# Patient Record
Sex: Male | Born: 1940 | Race: White | Hispanic: No | Marital: Married | Smoking: Current every day smoker
Health system: Southern US, Community
[De-identification: ages and names within clinical notes are randomized; demographics above are authoritative.]

## PROBLEM LIST (undated history)

## (undated) DIAGNOSIS — I350 Nonrheumatic aortic (valve) stenosis: Secondary | ICD-10-CM

## (undated) DIAGNOSIS — R011 Cardiac murmur, unspecified: Secondary | ICD-10-CM

## (undated) DIAGNOSIS — F329 Major depressive disorder, single episode, unspecified: Secondary | ICD-10-CM

## (undated) DIAGNOSIS — N400 Enlarged prostate without lower urinary tract symptoms: Secondary | ICD-10-CM

## (undated) DIAGNOSIS — F32A Depression, unspecified: Secondary | ICD-10-CM

## (undated) DIAGNOSIS — J45909 Unspecified asthma, uncomplicated: Secondary | ICD-10-CM

## (undated) DIAGNOSIS — A0472 Enterocolitis due to Clostridium difficile, not specified as recurrent: Secondary | ICD-10-CM

## (undated) DIAGNOSIS — IMO0001 Reserved for inherently not codable concepts without codable children: Secondary | ICD-10-CM

## (undated) DIAGNOSIS — K219 Gastro-esophageal reflux disease without esophagitis: Secondary | ICD-10-CM

## (undated) HISTORY — DX: Major depressive disorder, single episode, unspecified: F32.9

## (undated) HISTORY — DX: Cardiac murmur, unspecified: R01.1

## (undated) HISTORY — DX: Benign prostatic hyperplasia without lower urinary tract symptoms: N40.0

## (undated) HISTORY — DX: Nonrheumatic aortic (valve) stenosis: I35.0

## (undated) HISTORY — DX: Unspecified asthma, uncomplicated: J45.909

## (undated) HISTORY — DX: Depression, unspecified: F32.A

---

## 2005-08-13 ENCOUNTER — Ambulatory Visit: Payer: Self-pay | Admitting: Gastroenterology

## 2006-10-22 ENCOUNTER — Ambulatory Visit: Payer: Self-pay | Admitting: Gastroenterology

## 2013-06-07 DIAGNOSIS — N529 Male erectile dysfunction, unspecified: Secondary | ICD-10-CM | POA: Insufficient documentation

## 2013-06-07 DIAGNOSIS — N400 Enlarged prostate without lower urinary tract symptoms: Secondary | ICD-10-CM | POA: Insufficient documentation

## 2013-06-07 DIAGNOSIS — I359 Nonrheumatic aortic valve disorder, unspecified: Secondary | ICD-10-CM | POA: Insufficient documentation

## 2013-12-11 DIAGNOSIS — R21 Rash and other nonspecific skin eruption: Secondary | ICD-10-CM | POA: Insufficient documentation

## 2014-08-15 DIAGNOSIS — Z720 Tobacco use: Secondary | ICD-10-CM | POA: Insufficient documentation

## 2014-10-11 ENCOUNTER — Ambulatory Visit (INDEPENDENT_AMBULATORY_CARE_PROVIDER_SITE_OTHER): Payer: Commercial Managed Care - HMO | Admitting: Cardiovascular Disease

## 2014-10-11 ENCOUNTER — Encounter: Payer: Self-pay | Admitting: Cardiovascular Disease

## 2014-10-11 VITALS — BP 112/80 | HR 63 | Ht 73.5 in | Wt 165.0 lb

## 2014-10-11 DIAGNOSIS — I350 Nonrheumatic aortic (valve) stenosis: Secondary | ICD-10-CM | POA: Insufficient documentation

## 2014-10-11 NOTE — Progress Notes (Signed)
Cardiology Office Note Date:  10/11/2014   ID:  Daniel Sims Sr., DOB 11/16/1940, MRN [id number]  PCP:  Tracie Harrier, MD  Cardiologist:  Jordan Hawks, MD  Chief Complaint  Patient presents with  . Shortness of Breath   History of Present Illness: Daniel KIMMEY Sr. is a 74 y.o. male who presents for  Evaluation of aortic stenosis. The patient has been followed for a heart murmur for many years. He remembers being told of a murmur when he was in his 37s. He was always asymptomatic until the past 6 months. An echocardiogram performed August 30 demonstrated normal left ventricular systolic function with thickening and calcification of the aortic valve. The peak transvalvular velocity was 399 centimeters per second. The mean transaortic valve gradient was 37 mmHg and the peak gradient was 64 mmHg. There was trivial mitral regurgitation, mild tricuspid regurgitation , and no other significant abnormalities noted. The patient was referred to Dr. Ubaldo Glassing who subsequently referred him here for consideration of further treatment options for severe symptomatic aortic stenosis.    The patient describes a 6 month history of exertional shortness of breath and "exhaustion." He also has developed discomfort in his chest radiating to the upper back that occurs both after exertion and during physical activity. When he slows down or stops his symptoms abate. He denies lightheadedness or syncope. He's had no resting chest discomfort or shortness of breath.  The patient is here today with his wife. He is retired from the [employer]. They have 3 grown children who live locally. The patient has been a smoker now for his entire adult life. He currently smokes one third pack of cigarettes daily. He does not drink alcohol. He has never been hospitalized, nor has he had any surgeries.   Past Medical History  Diagnosis Date  . BPH (benign prostatic hyperplasia)   . Asthma   . Aortic stenosis     EF 50% echo 2010    . Heart murmur     No past surgical history on file.  Current Outpatient Prescriptions  Medication Sig Dispense Refill  . CVS ZINC 50 MG TABS Take 1 tablet by mouth daily.    . ferrous sulfate 325 (65 FE) MG tablet Take 1 tablet by mouth daily.    Marland Kitchen ibuprofen (ADVIL,MOTRIN) 200 MG tablet Take 800 mg by mouth every 8 (eight) hours as needed. Prn for pain    . levocetirizine (XYZAL) 5 MG tablet Take 5 mg by mouth daily.    . montelukast (SINGULAIR) 10 MG tablet Take 10 mg by mouth at bedtime.    . Multiple Vitamin (MULTI-VITAMINS) TABS Take 1 tablet by mouth daily.    Marland Kitchen omeprazole (PRILOSEC) 20 MG capsule Take 20 mg by mouth daily.    . sildenafil (REVATIO) 20 MG tablet Take 20 mg by mouth 2 (two) times daily. Prn for Erectile dysfunction    . tamsulosin (FLOMAX) 0.4 MG CAPS capsule Take 0.4 mg by mouth daily.    . vitamin C (ASCORBIC ACID) 500 MG tablet Take 500 mg by mouth daily.     No current facility-administered medications for this visit.    Allergies:   Penicillin g   Social History:  The patient  reports that he has been smoking Cigarettes.  He has been smoking about 0.75 packs per day. He does not have any smokeless tobacco history on file.   Family History:  The patient's family history includes Breast cancer in his mother; Diabetes in his  father; Heart disease in his father; Lung cancer in his mother.    ROS:  Please see the history of present illness.  Otherwise, review of systems is positive for  Chest pain, orthopnea, cough , excessive fatigue, snoring, wheezing.  All other systems are reviewed and negative.    PHYSICAL EXAM: VS:  BP 112/80 mmHg  Pulse 63  Ht 6' 1.5" (1.867 m)  Wt 165 lb (74.844 kg)  BMI 21.47 kg/m2 , BMI Body mass index is 21.47 kg/(m^2). GEN: Well nourished, well developed, in no acute distress HEENT: normal Neck: no JVD, no masses.  Normal carotid upstrokes with bilateral bruits Cardiac: RRR with  Grade 3/6 late peaking harsh systolic murmur  best heard at the left lower sternal border, no diastolic murmur               Respiratory:  clear to auscultation bilaterally, normal work of breathing GI: soft, nontender, nondistended, + BS MS: no deformity or atrophy Ext: no pretibial edema Skin: warm and dry, no rash Neuro:  Strength and sensation are intact Psych: euthymic mood, full affect  EKG:  EKG is ordered today. The ekg ordered today shows  Normal sinus rhythm 63 bpm, within normal limits.  Recent Labs: No results found for requested labs within last 365 days.   Lipid Panel  No results found for: CHOL, TRIG, HDL, CHOLHDL, VLDL, LDLCALC, LDLDIRECT    Wt Readings from Last 3 Encounters:  10/11/14 165 lb (74.844 kg)     Cardiac Studies Reviewed:  2-D echocardiogram as outlined above  STS Risk Calculator: Procedure: AV Replacement  Risk of Mortality: 2.052%  Morbidity or Mortality: 15.769%  Long Length of Stay: 6.653%  Short Length of Stay: 40.657%  Permanent Stroke: 0.978%  Prolonged Ventilation: 9.317%  DSW Infection: 0.397%  Renal Failure: 3.173%  Reoperation: 7.618%    ASSESSMENT AND PLAN: This is a 74 year old gentleman with physical exam and echo findings consistent with severe aortic stenosis. He exhibits symptoms of fatigue, exertional dyspnea , and chest discomfort , New York Heart Association functional class III.  I have reviewed the natural history of aortic stenosis with the patient and his wife who is present today. We have discussed the limitations of medical therapy and the poor prognosis associated with symptomatic aortic stenosis. We have also reviewed potential treatment options, including palliative medical therapy, conventional surgical aortic valve replacement, and transcatheter aortic valve replacement. We discussed treatment options in the context of this patient's specific comorbid medical conditions.   I agree the next step in his evaluation should be right and left heart catheterization.  I have reviewed the risks, indications, and alternatives to cardiac catheterization with the patient. Risks include but are not limited to bleeding, infection, vascular injury, stroke, myocardial infection, arrhythmia, kidney injury, radiation-related injury in the case of prolonged fluoroscopy use, emergency cardiac surgery, and death. The patient understands the risks of serious complication is low (<9%).  Pending the results of cardiac catheterization, he will be referred to cardiac surgery for consideration of further treatment options. He does have some degree of chronic lung disease and this will need to be better defined prior to cardiac surgery. I suspect his lung disease is not severe based on his exam and symptoms. His STS risk of mortality for isolated AVR is approximately 2%.  He has a prominent anginal component to his symptoms and considering his long tobacco history he very well may have obstructive CAD. Further plans pending results of cardiac cath.  Current  medicines are reviewed with the patient today.  The patient voices no concerns regarding medicines.  Labs/ tests ordered today include:   Orders Placed This Encounter  Procedures  . CBC  . Basic Metabolic Panel (BMET)  . EKG 12-Lead    Disposition:   FU pending catheterization results  Signed, Sherren Mocha, MD  10/11/2014 1:49 PM    [facility] [place], [place], [place]  [address] Phone: [phone]; Fax: [phone]

## 2014-10-11 NOTE — Patient Instructions (Addendum)
Medication Instructions:  Your physician recommends that you continue on your current medications as directed. Please refer to the Current Medication list given to you today.   Labwork:  CBC BMET  NEEDED ON 10/15/14   Testing/Procedures:  *YOU HAVE BEEN SCHEDULED FOR A LEFT AND RIGHT HEART CATH WITH DR      ON  9 /30 /16  @ 7:30 AM    *PLEASE ARRIVE @ 5:30 AM  [place] [place] ENTRANCE TO BE DIRECT TO        ADMITTING  *PLEASE HAVE NOTHING TO EAST OR DRINK AFTER MIDNIGHT THE NIGHT      BEFORE YOUR PROCEDURE   *YOU MAY TAKE ALL YOUR MORNING MEDS WITH A SMALL AMOUNT OF WATER   * MAKE SURE YOU HAVE A CHANGE OF CLOTHING JUST  IN CASE YOU MAY    Follow-Up:  POST CATH FOLLOW UP WITH DR Burt Knack  AFTER 10/19/14   Any Other Special Instructions Will Be Listed Below (If Applicable).

## 2014-10-15 ENCOUNTER — Other Ambulatory Visit (INDEPENDENT_AMBULATORY_CARE_PROVIDER_SITE_OTHER): Payer: Commercial Managed Care - HMO | Admitting: *Deleted

## 2014-10-15 DIAGNOSIS — I350 Nonrheumatic aortic (valve) stenosis: Secondary | ICD-10-CM

## 2014-10-15 LAB — BASIC METABOLIC PANEL
BUN: 18 mg/dL (ref 6–23)
CALCIUM: 9.3 mg/dL (ref 8.4–10.5)
CHLORIDE: 105 meq/L (ref 96–112)
CO2: 30 meq/L (ref 19–32)
CREATININE: 1.24 mg/dL (ref 0.40–1.50)
GFR: 60.52 mL/min (ref >60.00)
GLUCOSE: 85 mg/dL (ref 70–99)
Potassium: 4.7 mEq/L (ref 3.5–5.1)
Sodium: 140 mEq/L (ref 135–145)

## 2014-10-15 LAB — CBC
HEMATOCRIT: 46.7 % (ref 39.0–52.0)
HEMOGLOBIN: 15.6 g/dL (ref 13.0–17.0)
MCHC: 33.5 g/dL (ref 30.0–36.0)
MCV: 96.3 fl (ref 78.0–100.0)
PLATELETS: 313 10*3/uL (ref 150.0–400.0)
RBC: 4.85 Mil/uL (ref 4.22–5.81)
RDW: 13.2 % (ref 11.5–15.5)
WBC: 10.4 10*3/uL (ref 4.0–10.5)

## 2014-10-19 ENCOUNTER — Encounter (HOSPITAL_COMMUNITY): Payer: Self-pay | Admitting: Cardiovascular Disease

## 2014-10-19 ENCOUNTER — Encounter (HOSPITAL_COMMUNITY)
Admission: RE | Disposition: A | Discharge status: Home / Self Care | Payer: Self-pay | Source: Ambulatory Visit | Attending: Cardiovascular Disease

## 2014-10-19 ENCOUNTER — Ambulatory Visit (HOSPITAL_COMMUNITY)
Admission: RE | Admit: 2014-10-19 | Discharge: 2014-10-19 | Disposition: A | Discharge status: Home / Self Care | Payer: Commercial Managed Care - HMO | Source: Ambulatory Visit | Attending: Cardiovascular Disease | Admitting: Cardiovascular Disease

## 2014-10-19 DIAGNOSIS — F17210 Nicotine dependence, cigarettes, uncomplicated: Secondary | ICD-10-CM | POA: Insufficient documentation

## 2014-10-19 DIAGNOSIS — J45909 Unspecified asthma, uncomplicated: Secondary | ICD-10-CM | POA: Insufficient documentation

## 2014-10-19 DIAGNOSIS — N400 Enlarged prostate without lower urinary tract symptoms: Secondary | ICD-10-CM | POA: Diagnosis not present

## 2014-10-19 DIAGNOSIS — I350 Nonrheumatic aortic (valve) stenosis: Secondary | ICD-10-CM | POA: Diagnosis not present

## 2014-10-19 HISTORY — PX: CARDIAC CATHETERIZATION: SHX172

## 2014-10-19 LAB — POCT I-STAT 3, VENOUS BLOOD GAS (G3P V)
ACID-BASE DEFICIT: 1 mmol/L (ref 0.0–2.0)
BICARBONATE: 25.2 meq/L — AB (ref 20.0–24.0)
BICARBONATE: 25.5 meq/L — AB (ref 20.0–24.0)
Bicarbonate: 26.1 mEq/L — ABNORMAL HIGH (ref 20.0–24.0)
O2 SAT: 67 %
O2 SAT: 68 %
O2 SAT: 71 %
PCO2 VEN: 46.9 mmHg (ref 45.0–50.0)
PO2 VEN: 37 mmHg (ref 30.0–45.0)
PO2 VEN: 38 mmHg (ref 30.0–45.0)
TCO2: 27 mmol/L (ref 0–100)
TCO2: 27 mmol/L (ref 0–100)
TCO2: 28 mmol/L (ref 0–100)
pCO2, Ven: 45.1 mmHg (ref 45.0–50.0)
pCO2, Ven: 48.1 mmHg (ref 45.0–50.0)
pH, Ven: 7.338 — ABNORMAL HIGH (ref 7.250–7.300)
pH, Ven: 7.342 — ABNORMAL HIGH (ref 7.250–7.300)
pH, Ven: 7.361 — ABNORMAL HIGH (ref 7.250–7.300)
pO2, Ven: 39 mmHg (ref 30.0–45.0)

## 2014-10-19 LAB — POCT I-STAT 3, ART BLOOD GAS (G3+)
Bicarbonate: 25.1 mEq/L — ABNORMAL HIGH (ref 20.0–24.0)
O2 Saturation: 93 %
PCO2 ART: 43.3 mmHg (ref 35.0–45.0)
PH ART: 7.371 (ref 7.350–7.450)
TCO2: 26 mmol/L (ref 0–100)
pO2, Arterial: 71 mmHg — ABNORMAL LOW (ref 80.0–100.0)

## 2014-10-19 LAB — PROTIME-INR
INR: 1.02 (ref 0.00–1.49)
PROTHROMBIN TIME: 13.6 s (ref 11.6–15.2)

## 2014-10-19 SURGERY — RIGHT/LEFT HEART CATH AND CORONARY ANGIOGRAPHY
Anesthesia: LOCAL

## 2014-10-19 MED ORDER — LIDOCAINE HCL (PF) 1 % IJ SOLN
INTRAMUSCULAR | Status: DC | PRN
  Administered 2014-10-19: 09:00:00

## 2014-10-19 MED ORDER — FENTANYL CITRATE (PF) 100 MCG/2ML IJ SOLN
INTRAMUSCULAR | Status: AC
  Filled 2014-10-19: qty 4

## 2014-10-19 MED ORDER — SODIUM CHLORIDE 0.9 % IV SOLN: 250.0000 mL | INTRAVENOUS | Status: DC | PRN

## 2014-10-19 MED ORDER — SODIUM CHLORIDE 0.9 % IV SOLN: INTRAVENOUS | Status: DC

## 2014-10-19 MED ORDER — SODIUM CHLORIDE 0.9 % IJ SOLN: 3.0000 mL | Freq: Two times a day (BID) | INTRAMUSCULAR | Status: DC

## 2014-10-19 MED ORDER — FENTANYL CITRATE (PF) 100 MCG/2ML IJ SOLN
INTRAMUSCULAR | Status: DC | PRN
  Administered 2014-10-19: 25 ug via INTRAVENOUS

## 2014-10-19 MED ORDER — MIDAZOLAM HCL 2 MG/2ML IJ SOLN
INTRAMUSCULAR | Status: AC
  Filled 2014-10-19: qty 4

## 2014-10-19 MED ORDER — HEPARIN (PORCINE) IN NACL 2-0.9 UNIT/ML-% IJ SOLN
INTRAMUSCULAR | Status: AC
  Filled 2014-10-19: qty 1000

## 2014-10-19 MED ORDER — HEPARIN SODIUM (PORCINE) 1000 UNIT/ML IJ SOLN
INTRAMUSCULAR | Status: DC | PRN
  Administered 2014-10-19: 3500 [IU] via INTRAVENOUS

## 2014-10-19 MED ORDER — ASPIRIN 81 MG PO CHEW
CHEWABLE_TABLET | ORAL | Status: AC
  Filled 2014-10-19: qty 1

## 2014-10-19 MED ORDER — SODIUM CHLORIDE 0.9 % WEIGHT BASED INFUSION
3.0000 mL/kg/h | INTRAVENOUS | Status: AC
  Administered 2014-10-19: 3 mL/kg/h via INTRAVENOUS

## 2014-10-19 MED ORDER — ACETAMINOPHEN 325 MG PO TABS: 650.0000 mg | ORAL_TABLET | ORAL | Status: DC | PRN

## 2014-10-19 MED ORDER — HEPARIN SODIUM (PORCINE) 1000 UNIT/ML IJ SOLN
INTRAMUSCULAR | Status: AC
Start: 2014-10-19 — End: 2014-10-19
  Filled 2014-10-19: qty 1

## 2014-10-19 MED ORDER — ONDANSETRON HCL 4 MG/2ML IJ SOLN: 4.0000 mg | Freq: Four times a day (QID) | INTRAMUSCULAR | Status: DC | PRN

## 2014-10-19 MED ORDER — NITROGLYCERIN 1 MG/10 ML FOR IR/CATH LAB
INTRA_ARTERIAL | Status: AC
Start: 2014-10-19 — End: 2014-10-19
  Filled 2014-10-19: qty 10

## 2014-10-19 MED ORDER — SODIUM CHLORIDE 0.9 % IJ SOLN: 3.0000 mL | INTRAMUSCULAR | Status: DC | PRN

## 2014-10-19 MED ORDER — VERAPAMIL HCL 2.5 MG/ML IV SOLN
INTRAVENOUS | Status: DC | PRN
  Administered 2014-10-19: 08:00:00 via INTRA_ARTERIAL

## 2014-10-19 MED ORDER — ASPIRIN 81 MG PO CHEW
81.0000 mg | CHEWABLE_TABLET | ORAL | Status: AC
  Administered 2014-10-19: 81 mg via ORAL

## 2014-10-19 MED ORDER — SODIUM CHLORIDE 0.9 % WEIGHT BASED INFUSION: 1.0000 mL/kg/h | INTRAVENOUS | Status: DC

## 2014-10-19 MED ORDER — MIDAZOLAM HCL 2 MG/2ML IJ SOLN
INTRAMUSCULAR | Status: DC | PRN
  Administered 2014-10-19: 1 mg via INTRAVENOUS

## 2014-10-19 MED ORDER — LIDOCAINE HCL (PF) 1 % IJ SOLN
INTRAMUSCULAR | Status: AC
Start: 2014-10-19 — End: 2014-10-19
  Filled 2014-10-19: qty 30

## 2014-10-19 MED ORDER — VERAPAMIL HCL 2.5 MG/ML IV SOLN
INTRAVENOUS | Status: AC
  Filled 2014-10-19: qty 2

## 2014-10-19 SURGICAL SUPPLY — 14 items
CATH BALLN WEDGE 5F 110CM (CATHETERS) ×2 IMPLANT
CATH INFINITI 5 FR JL3.5 (CATHETERS) ×2 IMPLANT
CATH INFINITI 5FR ANG PIGTAIL (CATHETERS) ×2 IMPLANT
CATH INFINITI JR4 5F (CATHETERS) ×2 IMPLANT
DEVICE RAD COMP TR BAND LRG (VASCULAR PRODUCTS) ×2 IMPLANT
GLIDESHEATH SLEND SS 6F .021 (SHEATH) ×2 IMPLANT
KIT HEART LEFT (KITS) ×2 IMPLANT
KIT HEART RIGHT NAMIC (KITS) ×2 IMPLANT
PACK CARDIAC CATHETERIZATION (CUSTOM PROCEDURE TRAY) ×2 IMPLANT
SHEATH FAST CATH BRACH 5F 5CM (SHEATH) ×2 IMPLANT
SYR MEDRAD MARK V 150ML (SYRINGE) ×2 IMPLANT
TRANSDUCER W/STOPCOCK (MISCELLANEOUS) ×2 IMPLANT
TUBING CIL FLEX 10 FLL-RA (TUBING) ×2 IMPLANT
WIRE SAFE-T 1.5MM-J .035X260CM (WIRE) ×2 IMPLANT

## 2014-10-19 NOTE — Discharge Instructions (Signed)
Radial Site Care °Refer to this sheet in the next few weeks. These instructions provide you with information on caring for yourself after your procedure. Your caregiver may also give you more specific instructions. Your treatment has been planned according to current medical practices, but problems sometimes occur. Call your caregiver if you have any problems or questions after your procedure. °HOME CARE INSTRUCTIONS °· You may shower the day after the procedure. Remove the bandage (dressing) and gently wash the site with plain soap and water. Gently pat the site dry. °· Do not apply powder or lotion to the site. °· Do not submerge the affected site in water for 3 to 5 days. °· Inspect the site at least twice daily. °· Do not flex or bend the affected arm for 24 hours. °· No lifting over 5 pounds (2.3 kg) for 5 days after your procedure. °· Do not drive home if you are discharged the same day of the procedure. Have someone else drive you. °· You may drive 24 hours after the procedure unless otherwise instructed by your caregiver. °· Do not operate machinery or power tools for 24 hours. °· A responsible adult should be with you for the first 24 hours after you arrive home. °What to expect: °· Any bruising will usually fade within 1 to 2 weeks. °· Blood that collects in the tissue (hematoma) may be painful to the touch. It should usually decrease in size and tenderness within 1 to 2 weeks. °SEEK IMMEDIATE MEDICAL CARE IF: °· You have unusual pain at the radial site. °· You have redness, warmth, swelling, or pain at the radial site. °· You have drainage (other than a small amount of blood on the dressing). °· You have chills. °· You have a fever or persistent symptoms for more than 72 hours. °· You have a fever and your symptoms suddenly get worse. °· Your arm becomes pale, cool, tingly, or numb. °· You have heavy bleeding from the site. Hold pressure on the site and call 911. °Document Released: 02/07/2010 Document  Revised: 03/30/2011 Document Reviewed: 02/07/2010 °ExitCare® Patient Information ©2015 [employer]. This information is not intended to replace advice given to you by your health care provider. Make sure you discuss any questions you have with your health care provider. ° °

## 2014-10-19 NOTE — Interval H&P Note (Signed)
History and Physical Interval Note:  10/19/2014 7:46 AM  Daniel Sims Sr.  has presented today for surgery, with the diagnosis of aortic stenosis  The various methods of treatment have been discussed with the patient and family. After consideration of risks, benefits and other options for treatment, the patient has consented to  Procedure(s): Right/Left Heart Cath and Coronary Angiography (N/A) as a surgical intervention .  The patient's history has been reviewed, patient examined, no change in status, stable for surgery.  I have reviewed the patient's chart and labs.  Questions were answered to the patient's satisfaction.     Sherren Mocha

## 2014-10-19 NOTE — H&P (View-Only) (Signed)
Cardiology Office Note Date:  10/11/2014   ID:  Daniel Kennedy., DOB 01-13-1941, MRN [id number]  PCP:  Tracie Harrier, MD  Cardiologist:  Jordan Hawks, MD  Chief Complaint  Patient presents with  . Shortness of Breath   History of Present Illness: Daniel Kennedy. is a 74 y.o. male who presents for  Evaluation of aortic stenosis. The patient has been followed for a heart murmur for many years. He remembers being told of a murmur when he was in his 59s. He was always asymptomatic until the past 6 months. An echocardiogram performed August 30 demonstrated normal left ventricular systolic function with thickening and calcification of the aortic valve. The peak transvalvular velocity was 399 centimeters per second. The mean transaortic valve gradient was 37 mmHg and the peak gradient was 64 mmHg. There was trivial mitral regurgitation, mild tricuspid regurgitation , and no other significant abnormalities noted. The patient was referred to Dr. Ubaldo Glassing who subsequently referred him here for consideration of further treatment options for severe symptomatic aortic stenosis.    The patient describes a 6 month history of exertional shortness of breath and "exhaustion." He also has developed discomfort in his chest radiating to the upper back that occurs both after exertion and during physical activity. When he slows down or stops his symptoms abate. He denies lightheadedness or syncope. He's had no resting chest discomfort or shortness of breath.  The patient is here today with his wife. He is retired from the [employer]. They have 3 grown children who live locally. The patient has been a smoker now for his entire adult life. He currently smokes one third pack of cigarettes daily. He does not drink alcohol. He has never been hospitalized, nor has he had any surgeries.   Past Medical History  Diagnosis Date  . BPH (benign prostatic hyperplasia)   . Asthma   . Aortic stenosis     EF 50% echo 2010    . Heart murmur     No past surgical history on file.  Current Outpatient Prescriptions  Medication Sig Dispense Refill  . CVS ZINC 50 MG TABS Take 1 tablet by mouth daily.    . ferrous sulfate 325 (65 FE) MG tablet Take 1 tablet by mouth daily.    Marland Kitchen ibuprofen (ADVIL,MOTRIN) 200 MG tablet Take 800 mg by mouth every 8 (eight) hours as needed. Prn for pain    . levocetirizine (XYZAL) 5 MG tablet Take 5 mg by mouth daily.    . montelukast (SINGULAIR) 10 MG tablet Take 10 mg by mouth at bedtime.    . Multiple Vitamin (MULTI-VITAMINS) TABS Take 1 tablet by mouth daily.    Marland Kitchen omeprazole (PRILOSEC) 20 MG capsule Take 20 mg by mouth daily.    . sildenafil (REVATIO) 20 MG tablet Take 20 mg by mouth 2 (two) times daily. Prn for Erectile dysfunction    . tamsulosin (FLOMAX) 0.4 MG CAPS capsule Take 0.4 mg by mouth daily.    . vitamin C (ASCORBIC ACID) 500 MG tablet Take 500 mg by mouth daily.     No current facility-administered medications for this visit.    Allergies:   Penicillin g   Social History:  The patient  reports that he has been smoking Cigarettes.  He has been smoking about 0.75 packs per day. He does not have any smokeless tobacco history on file.   Family History:  The patient's family history includes Breast cancer in his mother; Diabetes in his  father; Heart disease in his father; Lung cancer in his mother.    ROS:  Please see the history of present illness.  Otherwise, review of systems is positive for  Chest pain, orthopnea, cough , excessive fatigue, snoring, wheezing.  All other systems are reviewed and negative.    PHYSICAL EXAM: VS:  BP 112/80 mmHg  Pulse 63  Ht 6' 1.5" (1.867 m)  Wt 165 lb (74.844 kg)  BMI 21.47 kg/m2 , BMI Body mass index is 21.47 kg/(m^2). GEN: Well nourished, well developed, in no acute distress HEENT: normal Neck: no JVD, no masses.  Normal carotid upstrokes with bilateral bruits Cardiac: RRR with  Grade 3/6 late peaking harsh systolic murmur  best heard at the left lower sternal border, no diastolic murmur               Respiratory:  clear to auscultation bilaterally, normal work of breathing GI: soft, nontender, nondistended, + BS MS: no deformity or atrophy Ext: no pretibial edema Skin: warm and dry, no rash Neuro:  Strength and sensation are intact Psych: euthymic mood, full affect  EKG:  EKG is ordered today. The ekg ordered today shows  Normal sinus rhythm 63 bpm, within normal limits.  Recent Labs: No results found for requested labs within last 365 days.   Lipid Panel  No results found for: CHOL, TRIG, HDL, CHOLHDL, VLDL, LDLCALC, LDLDIRECT    Wt Readings from Last 3 Encounters:  10/11/14 165 lb (74.844 kg)     Cardiac Studies Reviewed:  2-D echocardiogram as outlined above  STS Risk Calculator: Procedure: AV Replacement  Risk of Mortality: 2.052%  Morbidity or Mortality: 15.769%  Long Length of Stay: 6.653%  Short Length of Stay: 40.657%  Permanent Stroke: 0.978%  Prolonged Ventilation: 9.317%  DSW Infection: 0.397%  Renal Failure: 3.173%  Reoperation: 7.618%    ASSESSMENT AND PLAN: This is a 74 year old gentleman with physical exam and echo findings consistent with severe aortic stenosis. He exhibits symptoms of fatigue, exertional dyspnea , and chest discomfort , New York Heart Association functional class III.  I have reviewed the natural history of aortic stenosis with the patient and his wife who is present today. We have discussed the limitations of medical therapy and the poor prognosis associated with symptomatic aortic stenosis. We have also reviewed potential treatment options, including palliative medical therapy, conventional surgical aortic valve replacement, and transcatheter aortic valve replacement. We discussed treatment options in the context of this patient's specific comorbid medical conditions.   I agree the next step in his evaluation should be right and left heart catheterization.  I have reviewed the risks, indications, and alternatives to cardiac catheterization with the patient. Risks include but are not limited to bleeding, infection, vascular injury, stroke, myocardial infection, arrhythmia, kidney injury, radiation-related injury in the case of prolonged fluoroscopy use, emergency cardiac surgery, and death. The patient understands the risks of serious complication is low (<9%).  Pending the results of cardiac catheterization, he will be referred to cardiac surgery for consideration of further treatment options. He does have some degree of chronic lung disease and this will need to be better defined prior to cardiac surgery. I suspect his lung disease is not severe based on his exam and symptoms. His STS risk of mortality for isolated AVR is approximately 2%.  He has a prominent anginal component to his symptoms and considering his long tobacco history he very well may have obstructive CAD. Further plans pending results of cardiac cath.  Current  medicines are reviewed with the patient today.  The patient voices no concerns regarding medicines.  Labs/ tests ordered today include:   Orders Placed This Encounter  Procedures  . CBC  . Basic Metabolic Panel (BMET)  . EKG 12-Lead    Disposition:   FU pending catheterization results  Signed, Sherren Mocha, MD  10/11/2014 1:49 PM    [facility] [place], [place], [place]  [address] Phone: [phone]; Fax: [phone]

## 2014-10-25 ENCOUNTER — Institutional Professional Consult (permissible substitution) (INDEPENDENT_AMBULATORY_CARE_PROVIDER_SITE_OTHER): Payer: Commercial Managed Care - HMO | Admitting: Surgery

## 2014-10-25 ENCOUNTER — Encounter: Payer: Self-pay | Admitting: Surgery

## 2014-10-25 VITALS — BP 133/77 | HR 66 | Resp 16 | Ht 74.0 in | Wt 160.0 lb

## 2014-10-25 DIAGNOSIS — I208 Other forms of angina pectoris: Secondary | ICD-10-CM | POA: Diagnosis not present

## 2014-10-25 DIAGNOSIS — R0609 Other forms of dyspnea: Secondary | ICD-10-CM

## 2014-10-25 DIAGNOSIS — I350 Nonrheumatic aortic (valve) stenosis: Secondary | ICD-10-CM | POA: Diagnosis not present

## 2014-10-26 ENCOUNTER — Other Ambulatory Visit: Payer: Self-pay | Admitting: *Deleted

## 2014-10-26 DIAGNOSIS — I350 Nonrheumatic aortic (valve) stenosis: Secondary | ICD-10-CM

## 2014-10-28 ENCOUNTER — Encounter: Payer: Self-pay | Admitting: Surgery

## 2014-10-28 NOTE — Progress Notes (Signed)
Cardiothoracic Surgery Consultation  PCP is Daniel Harrier, MD Referring Provider is Daniel Mocha, MD Primary Cardiologist: Daniel Hawks, MD Chief Complaint  Patient presents with  . Shortness of Breath    eval aortic stenosis...ECHO 09/18/14 (report in Dr. Antionette Kennedy notes)...CATH 10/19/14  . Chest Pain    HPI:  The patient is a 74 year old gentleman with a long history of a heart murmur who felt fine until 6 months ago when he began developing exertional shortness of breath and fatigue with some chest discomfort. His symptoms resolve with rest. His wife feels like he has not been able to do the things he used to be able to do like working in the yard and taking walks. An echo on 09/18/2014 reportedly showed a mean aortic valve gradient of 37 mm Hg and a peak of 64 mm Hg with a calcified and thickened aortic valve. LV function was normal. He was referred to Dr. Ubaldo Kennedy and then to Dr. Burt Kennedy for consideration of TAVR.   He lives in [place] with his wife. He has 3 adult children. He has never been hospitalized. He continues to smoke 1/3 pack of cigarettes per day and has since he was old enough to smoke.  Past Medical History  Diagnosis Date  . BPH (benign prostatic hyperplasia)   . Asthma   . Aortic stenosis     EF 50% echo 2010  . Heart murmur     Past Surgical History  Procedure Laterality Date  . Cardiac catheterization N/A 10/19/2014    Procedure: Right/Left Heart Cath and Coronary Angiography;  Surgeon: Daniel Mocha, MD;  Location: [facility];  Service: Cardiovascular;  Laterality: N/A;    Family History  Problem Relation Age of Onset  . Breast cancer Mother   . Lung cancer Mother   . Heart disease Father   . Diabetes Father     Social History Social History  Substance Use Topics  . Smoking status: Current Every Day Smoker -- 0.33 packs/day for 58 years    Types: Cigarettes  . Smokeless tobacco: None  . Alcohol Use: None    Current Outpatient  Prescriptions  Medication Sig Dispense Refill  . ferrous sulfate 325 (65 FE) MG tablet Take 1 tablet by mouth daily.    Marland Kitchen levocetirizine (XYZAL) 5 MG tablet Take 5 mg by mouth daily.    . montelukast (SINGULAIR) 10 MG tablet Take 10 mg by mouth at bedtime.    . Multiple Vitamin (MULTI-VITAMINS) TABS Take 1 tablet by mouth daily.    Marland Kitchen omeprazole (PRILOSEC) 20 MG capsule Take 20 mg by mouth daily.    . sildenafil (REVATIO) 20 MG tablet Take 20 mg by mouth 2 (two) times daily. Prn for Erectile dysfunction    . tamsulosin (FLOMAX) 0.4 MG CAPS capsule Take 0.4 mg by mouth daily.    . vitamin C (ASCORBIC ACID) 500 MG tablet Take 500 mg by mouth daily.    . vitamin E (VITAMIN E) 400 UNIT capsule Take 400 Units by mouth daily.     No current facility-administered medications for this visit.    Allergies  Allergen Reactions  . Penicillin G Other (See Comments)    Review of Systems  Constitutional: Positive for activity change, appetite change and fatigue.  HENT: Positive for hearing loss.        Dentures  Eyes: Negative.   Respiratory: Positive for cough and shortness of breath.   Cardiovascular: Positive for chest pain. Negative for palpitations and  leg swelling.  Gastrointestinal: Negative.   Endocrine: Negative.   Genitourinary: Positive for frequency.  Musculoskeletal: Negative.   Skin: Negative.   Allergic/Immunologic: Negative.   Neurological: Negative.   Hematological: Negative.   Psychiatric/Behavioral: Negative.     BP 133/77 mmHg  Pulse 66  Resp 16  Ht 6\' 2"  (1.88 m)  Wt 160 lb (72.576 kg)  BMI 20.53 kg/m2  SpO2 97% Physical Exam  Constitutional: He is oriented to person, place, and time. He appears well-developed and well-nourished. No distress.  HENT:  Head: Normocephalic and atraumatic.  Mouth/Throat: Oropharynx is clear and moist.  Eyes: EOM are normal. Pupils are equal, round, and reactive to light.  Neck: Normal range of motion. Neck supple. No JVD present.  No thyromegaly present.  Cardiovascular: Normal rate and regular rhythm.   3/6 harsh systolic murmur loudest along left lower sternal border  Pulmonary/Chest: Effort normal and breath sounds normal. No respiratory distress. He has no rales.  Abdominal: Soft. Bowel sounds are normal. He exhibits no distension and no mass. There is no tenderness.  Musculoskeletal: Normal range of motion. He exhibits no edema.  Lymphadenopathy:    He has no cervical adenopathy.  Neurological: He is alert and oriented to person, place, and time. He has normal strength. No cranial nerve deficit or sensory deficit.  Psychiatric: He has a normal mood and affect.     Diagnostic Tests:  Procedures    Right/Left Heart Cath and Coronary Angiography    Conclusion     Prox Cx lesion, 20% stenosed.  The left ventricular systolic function is normal.  1. Widely patent coronary arteries with minor nonobstructive stenosis of the proximal LCx, angiographically normal LAD, and angiographically normal RCA 2. Normal LV systolic function with normal LVEDP 3. Calcified aortic valve with Moderate-Severe aortic stenosis by hemodynamic assessment, with peak-to-peak gradient of 33 mmHg, Mean gradient of 30 mmHg, and calculated AVA 1.3 square cm, AVA index 0.67  Recommendations: Continued evaluation for aortic valve replacement in this patient with exertional angina, dyspnea, and patent coronary arteries. Clinical scenario highly suggestive of symptomatic aortic stenosis.     Indications    Severe aortic stenosis [I35.0 (ICD-10-CM)]    Technique and Indications    INDICATION: Symptomatic aortic stenosis  PROCEDURAL DETAILS: There was an indwelling IV in a right antecubital vein. Using normal sterile technique, the IV was changed out for a 5 Fr brachial sheath over a 0.018 inch wire. The right wrist was then prepped, draped, and anesthetized with 1% lidocaine. Using the modified Seldinger technique a 5/6 French  Slender sheath was placed in the right radial artery. Intra-arterial verapamil was administered through the radial artery sheath. IV heparin was administered after a JR4 catheter was advanced into the central aorta. A Swan-Ganz catheter was used for the right heart catheterization. Standard protocol was followed for recording of right heart pressures and sampling of oxygen saturations. Fick cardiac output was calculated. Standard Judkins catheters were used for selective coronary angiography, aortic root angiography, and left ventriculography. There were no immediate procedural complications. The patient was transferred to the post catheterization recovery area for further monitoring.    Estimated blood loss <50 mL. There were no immediate complications during the procedure.    Coronary Findings    Dominance: Right   Left Anterior Descending  The vessel is angiographically normal.     Left Circumflex   . Prox Cx lesion, 20% stenosed.     Right Coronary Artery  The vessel is angiographically normal.  Right Heart Pressures Hemodynamic findings consistent with aortic stenosis. LV EDP is normal.    Wall Motion                 Left Heart    Left Ventricle The left ventricular size is normal. The left ventricular systolic function is normal. The left ventricular ejection fraction is 55-65% by visual estimate. There are no wall motion abnormalities in the left ventricle.    Coronary Diagrams    Diagnostic Diagram            Implants    Name ID Temporary Type Supply   No information to display    PACS Images    Show images for Cardiac catheterization     Link to Procedure Log    Procedure Log      Hemo Data       Most Recent Value   Fick Cardiac Output  7.16 L/min   Fick Cardiac Output Index  3.62 (L/min)/BSA   Aortic Mean Gradient  29.8 mmHg   Aortic Peak Gradient  33 mmHg   Aortic Valve Area  1.32   Aortic Value Area Index  0.67  cm2/BSA   RA A Wave  5 mmHg   RA V Wave  2 mmHg   RA Mean  1 mmHg   RV Systolic Pressure  24 mmHg   RV Diastolic Pressure  -1 mmHg   RV EDP  2 mmHg   PA Systolic Pressure  23 mmHg   PA Diastolic Pressure  5 mmHg   PA Mean  13 mmHg   PW A Wave  12 mmHg   PW V Wave  11 mmHg   PW Mean  7 mmHg   AO Systolic Pressure  94 mmHg   AO Diastolic Pressure  51 mmHg   AO Mean  69 mmHg   LV Systolic Pressure  782 mmHg   LV Diastolic Pressure  0 mmHg   LV EDP  5 mmHg   Arterial Occlusion Pressure Extended Systolic Pressure  97 mmHg   Arterial Occlusion Pressure Extended Diastolic Pressure  47 mmHg   Arterial Occlusion Pressure Extended Mean Pressure  66 mmHg   Left Ventricular Apex Extended Systolic Pressure  423 mmHg   Left Ventricular Apex Extended Diastolic Pressure  4 mmHg   Left Ventricular Apex Extended EDP Pressure  8 mmHg   QP/QS  1   TPVR Index  3.6 HRUI   TSVR Index  19.09 HRUI   PVR SVR Ratio  0.09   TPVR/TSVR Ratio  0.19    Order-Level Documents:    There are no order-level documents.    Encounter-Level Documents - 10/11/14:      Scan on 10/23/2014 10:37 AM by Provider Default, [place] on 10/23/2014 10:37 AM by Provider Default, MD     Scan on 10/23/2014 10:33 AM by Provider Default, MDScan on 10/23/2014 10:33 AM by Provider Default, MD     Scan on 10/19/2014 8:40 AM by Provider Default, MDScan on 10/19/2014 8:40 AM by Provider Default, MD     Electronic signature on 10/19/2014 5:18 AM    Signed    Electronically signed by Daniel Mocha, MD on 10/19/14 at [phone] EDT    STS Risk Calculator: Procedure: AV Replacement  Risk of Mortality: 2.052%  Morbidity or Mortality: 15.769%  Long Length of Stay: 6.653%  Short Length of Stay: 40.657%  Permanent Stroke: 0.978%  Prolonged Ventilation: 9.317%  DSW Infection: 0.397%  Renal Failure: 3.173%  Reoperation: 7.618%  Impression:  He has stage D severe symptomatic aortic stenosis  with progressive exertional shortness of breath and fatigue, NYHA class Ill. He has no significant coronary disease and normal LV function. I agree that AVR is indicated in this patient for relief of his symptoms and to prevent LV deterioration. I have reviewed the natural history of aortic stenosis with the patient and his wife who is present today. We have discussed the limitations of medical therapy and the poor prognosis associated with symptomatic aortic stenosis. We have also reviewed potential treatment options, including palliative medical therapy, conventional surgical aortic valve replacement, and transcatheter aortic valve replacement. His STS PROM is only about 2% and probably less than that so he is in the low risk group that are not candidates for TAVR at this time. I discussed the reasons for this with he and his wife and they understand. I think he will do well with open surgical AVR. I discussed the operative procedure with the patient and his wife including alternatives, benefits and risks; including but not limited to bleeding, blood transfusion, infection, stroke, myocardial infarction, graft failure, heart block requiring a permanent pacemaker, organ dysfunction, and death. I would plan to use a tissue valve at his age.  Daniel Sims Sr. understands and agrees to proceed.    Plan:  He will call back to schedule surgery after he discusses the timing with his family.  Gaye Pollack, MD [facility] [phone]

## 2014-11-06 ENCOUNTER — Encounter (HOSPITAL_COMMUNITY)
Admission: RE | Admit: 2014-11-06 | Discharge: 2014-11-06 | Disposition: A | Discharge status: Home / Self Care | Payer: Commercial Managed Care - HMO | Source: Ambulatory Visit | Attending: Surgery | Admitting: Surgery

## 2014-11-06 ENCOUNTER — Ambulatory Visit (HOSPITAL_COMMUNITY)
Admission: RE | Admit: 2014-11-06 | Discharge: 2014-11-06 | Disposition: A | Discharge status: Home / Self Care | Payer: Commercial Managed Care - HMO | Source: Ambulatory Visit | Attending: Surgery | Admitting: Surgery

## 2014-11-06 ENCOUNTER — Encounter (HOSPITAL_COMMUNITY): Payer: Self-pay

## 2014-11-06 ENCOUNTER — Ambulatory Visit (HOSPITAL_BASED_OUTPATIENT_CLINIC_OR_DEPARTMENT_OTHER)
Admission: RE | Admit: 2014-11-06 | Discharge: 2014-11-06 | Disposition: A | Discharge status: Home / Self Care | Payer: Commercial Managed Care - HMO | Source: Ambulatory Visit | Attending: Surgery | Admitting: Surgery

## 2014-11-06 VITALS — BP 147/76 | HR 66 | Temp 97.9°F | Resp 18 | Ht 74.0 in | Wt 164.4 lb

## 2014-11-06 DIAGNOSIS — F17210 Nicotine dependence, cigarettes, uncomplicated: Secondary | ICD-10-CM | POA: Insufficient documentation

## 2014-11-06 DIAGNOSIS — I350 Nonrheumatic aortic (valve) stenosis: Secondary | ICD-10-CM

## 2014-11-06 DIAGNOSIS — R05 Cough: Secondary | ICD-10-CM | POA: Insufficient documentation

## 2014-11-06 DIAGNOSIS — R0609 Other forms of dyspnea: Secondary | ICD-10-CM | POA: Insufficient documentation

## 2014-11-06 HISTORY — DX: Gastro-esophageal reflux disease without esophagitis: K21.9

## 2014-11-06 HISTORY — DX: Reserved for inherently not codable concepts without codable children: IMO0001

## 2014-11-06 LAB — PULMONARY FUNCTION TEST
DL/VA % PRED: 53 %
DL/VA: 2.57 ml/min/mmHg/L
DLCO unc % pred: 42 %
DLCO unc: 16.19 ml/min/mmHg
FEF 25-75 POST: 1.3 L/s
FEF 25-75 Pre: 1.25 L/sec
FEF2575-%CHANGE-POST: 3 %
FEF2575-%PRED-POST: 49 %
FEF2575-%Pred-Pre: 47 %
FEV1-%CHANGE-POST: 9 %
FEV1-%PRED-POST: 68 %
FEV1-%Pred-Pre: 63 %
FEV1-POST: 2.49 L
FEV1-Pre: 2.28 L
FEV1FVC-%CHANGE-POST: -6 %
FEV1FVC-%PRED-PRE: 82 %
FEV6-%Change-Post: 4 %
FEV6-%PRED-POST: 83 %
FEV6-%Pred-Pre: 79 %
FEV6-PRE: 3.72 L
FEV6-Post: 3.9 L
FEV6FVC-%CHANGE-POST: -10 %
FEV6FVC-%PRED-PRE: 104 %
FEV6FVC-%Pred-Post: 93 %
FVC-%Change-Post: 17 %
FVC-%PRED-POST: 89 %
FVC-%PRED-PRE: 75 %
FVC-POST: 4.41 L
FVC-PRE: 3.76 L
POST FEV1/FVC RATIO: 56 %
PRE FEV6/FVC RATIO: 99 %
Post FEV6/FVC ratio: 88 %
Pre FEV1/FVC ratio: 61 %
RV % PRED: 95 %
RV: 2.63 L
TLC % pred: 87 %
TLC: 6.9 L

## 2014-11-06 LAB — BLOOD GAS, ARTERIAL
Acid-base deficit: 0.8 mmol/L (ref 0.0–2.0)
BICARBONATE: 23 meq/L (ref 20.0–24.0)
Drawn by: 206361
FIO2: 0.21
O2 Saturation: 97 %
PCO2 ART: 35.1 mmHg (ref 35.0–45.0)
PH ART: 7.431 (ref 7.350–7.450)
PO2 ART: 83.2 mmHg (ref 80.0–100.0)
Patient temperature: 98.6
TCO2: 24 mmol/L (ref 0–100)

## 2014-11-06 LAB — URINALYSIS, ROUTINE W REFLEX MICROSCOPIC
Bilirubin Urine: NEGATIVE
Glucose, UA: NEGATIVE mg/dL
HGB URINE DIPSTICK: NEGATIVE
Ketones, ur: NEGATIVE mg/dL
LEUKOCYTES UA: NEGATIVE
Nitrite: NEGATIVE
Protein, ur: NEGATIVE mg/dL
SPECIFIC GRAVITY, URINE: 1.013 (ref 1.005–1.030)
UROBILINOGEN UA: 0.2 mg/dL (ref 0.0–1.0)
pH: 7.5 (ref 5.0–8.0)

## 2014-11-06 LAB — CBC
HEMATOCRIT: 46.1 % (ref 39.0–52.0)
HEMOGLOBIN: 15.8 g/dL (ref 13.0–17.0)
MCH: 33.3 pg (ref 26.0–34.0)
MCHC: 34.3 g/dL (ref 30.0–36.0)
MCV: 97.3 fL (ref 78.0–100.0)
Platelets: 261 10*3/uL (ref 150–400)
RBC: 4.74 MIL/uL (ref 4.22–5.81)
RDW: 13 % (ref 11.5–15.5)
WBC: 9.2 10*3/uL (ref 4.0–10.5)

## 2014-11-06 LAB — COMPREHENSIVE METABOLIC PANEL
ALK PHOS: 62 U/L (ref 38–126)
ALT: 14 U/L — AB (ref 17–63)
ANION GAP: 9 (ref 5–15)
AST: 17 U/L (ref 15–41)
Albumin: 4 g/dL (ref 3.5–5.0)
BILIRUBIN TOTAL: UNDETERMINED mg/dL (ref 0.3–1.2)
BUN: 17 mg/dL (ref 6–20)
CALCIUM: 9.4 mg/dL (ref 8.9–10.3)
CO2: 22 mmol/L (ref 22–32)
CREATININE: 1.15 mg/dL (ref 0.61–1.24)
Chloride: 108 mmol/L (ref 101–111)
GFR calc non Af Amer: >60 mL/min (ref >60)
Glucose, Bld: 87 mg/dL (ref 65–99)
Potassium: 5 mmol/L (ref 3.5–5.1)
Sodium: 139 mmol/L (ref 135–145)
TOTAL PROTEIN: 6.7 g/dL (ref 6.5–8.1)

## 2014-11-06 LAB — APTT: aPTT: 35 seconds (ref 24–37)

## 2014-11-06 LAB — ABO/RH: ABO/RH(D): O POS

## 2014-11-06 LAB — PROTIME-INR
INR: 1.06 (ref 0.00–1.49)
Prothrombin Time: 14 seconds (ref 11.6–15.2)

## 2014-11-06 LAB — TYPE AND SCREEN
ABO/RH(D): O POS
Antibody Screen: NEGATIVE

## 2014-11-06 LAB — SURGICAL PCR SCREEN
MRSA, PCR: NEGATIVE
Staphylococcus aureus: NEGATIVE

## 2014-11-06 MED ORDER — ALBUTEROL SULFATE (2.5 MG/3ML) 0.083% IN NEBU
2.5000 mg | INHALATION_SOLUTION | Freq: Once | RESPIRATORY_TRACT | Status: AC
  Administered 2014-11-06: 2.5 mg via RESPIRATORY_TRACT

## 2014-11-06 NOTE — Pre-Procedure Instructions (Signed)
Daniel Odor Newstrom Sr.  11/06/2014      [facility] [id number], [place] [address] Phone: [phone] Fax: [phone]    Your procedure is scheduled on  Thursday 11/08/14  Report to [facility] Admitting at 530 A.M.  Call this number if you have problems the morning of surgery:  [phone]   Remember:  Do not eat food or drink liquids after midnight.  Take these medicines the morning of surgery with A SIP OF WATER     OMEPRAZOLE  (STOP VIT E , HERBAL MEDICINES)   Do not wear jewelry, make-up or nail polish.  Do not wear lotions, powders, or perfumes.  You may wear deodorant.  Do not shave 48 hours prior to surgery.  Men may shave face and neck.  Do not bring valuables to the hospital.  [facility] is not responsible for any belongings or valuables.  Contacts, dentures or bridgework may not be worn into surgery.  Leave your suitcase in the car.  After surgery it may be brought to your room.  For patients admitted to the hospital, discharge time will be determined by your treatment team.  Patients discharged the day of surgery will not be allowed to drive home.   Name and phone number of your driver:    Special instructions:  [facility] - Preparing for Surgery  Before surgery, you can play an important role.  Because skin is not sterile, your skin needs to be as free of germs as possible.  You can reduce the number of germs on you skin by washing with CHG (chlorahexidine gluconate) soap before surgery.  CHG is an antiseptic cleaner which kills germs and bonds with the skin to continue killing germs even after washing.  Please DO NOT use if you have an allergy to CHG or antibacterial soaps.  If your skin becomes reddened/irritated stop using the CHG and inform your nurse when you arrive at Short Stay.  Do not shave (including legs and underarms) for at least 48 hours prior to the first CHG shower.  You may shave  your face.  Please follow these instructions carefully:   1.  Shower with CHG Soap the night before surgery and the                                morning of Surgery.  2.  If you choose to wash your hair, wash your hair first as usual with your       normal shampoo.  3.  After you shampoo, rinse your hair and body thoroughly to remove the                      Shampoo.  4.  Use CHG as you would any other liquid soap.  You can apply chg directly       to the skin and wash gently with scrungie or a clean washcloth.  5.  Apply the CHG Soap to your body ONLY FROM THE NECK DOWN.        Do not use on open wounds or open sores.  Avoid contact with your eyes,       ears, mouth and genitals (private parts).  Wash genitals (private parts)       with your normal soap.  6.  Wash thoroughly, paying special attention to the  area where your surgery        will be performed.  7.  Thoroughly rinse your body with warm water from the neck down.  8.  DO NOT shower/wash with your normal soap after using and rinsing off       the CHG Soap.  9.  Pat yourself dry with a clean towel.            10.  Wear clean pajamas.            11.  Place clean sheets on your bed the night of your first shower and do not        sleep with pets.  Day of Surgery  Do not apply any lotions/deoderants the morning of surgery.  Please wear clean clothes to the hospital/surgery center.    Please read over the following fact sheets that you were given. Pain Booklet, Coughing and Deep Breathing, Blood Transfusion Information, Open Heart Packet, MRSA Information and Surgical Site Infection Prevention

## 2014-11-06 NOTE — Progress Notes (Signed)
VASCULAR LAB PRELIMINARY  PRELIMINARY  PRELIMINARY  PRELIMINARY  Pre-op Cardiac Surgery  Carotid Findings:  Bilateral:  1-39% ICA stenosis.  Vertebral artery flow is antegrade.      Upper Extremity Right Left  Brachial Pressures 138  Triphasic  119  Triphasic   Radial Waveforms Triphasic  Triphasic   Ulnar Waveforms Triphasic Triphasic   Palmar Arch (Allen's Test) Within normal limits  Doppler decreases greater then 50% with radial compression, normal with ulnar compression     Daniel Kennedy, RVT 11/06/2014, 4:23 PM

## 2014-11-07 LAB — HEMOGLOBIN A1C
Hgb A1c MFr Bld: 5.5 % (ref 4.8–5.6)
MEAN PLASMA GLUCOSE: 111 mg/dL

## 2014-11-07 MED ORDER — LEVOFLOXACIN IN D5W 500 MG/100ML IV SOLN
500.0000 mg | INTRAVENOUS | Status: AC
  Administered 2014-11-08: 500 mg via INTRAVENOUS
  Filled 2014-11-07: qty 100

## 2014-11-07 MED ORDER — EPINEPHRINE HCL 1 MG/ML IJ SOLN
0.0000 ug/min | INTRAVENOUS | Status: DC
  Filled 2014-11-07: qty 4

## 2014-11-07 MED ORDER — SODIUM CHLORIDE 0.9 % IV SOLN
INTRAVENOUS | Status: DC
  Filled 2014-11-07: qty 30

## 2014-11-07 MED ORDER — SODIUM CHLORIDE 0.9 % IV SOLN
INTRAVENOUS | Status: AC
  Administered 2014-11-08: 70 mL/h via INTRAVENOUS
  Filled 2014-11-07: qty 40

## 2014-11-07 MED ORDER — POTASSIUM CHLORIDE 2 MEQ/ML IV SOLN
80.0000 meq | INTRAVENOUS | Status: DC
  Filled 2014-11-07: qty 40

## 2014-11-07 MED ORDER — DEXMEDETOMIDINE HCL IN NACL 400 MCG/100ML IV SOLN
0.1000 ug/kg/h | INTRAVENOUS | Status: AC
  Administered 2014-11-08: .3 ug/kg/h via INTRAVENOUS
  Filled 2014-11-07: qty 100

## 2014-11-07 MED ORDER — NITROGLYCERIN IN D5W 200-5 MCG/ML-% IV SOLN
2.0000 ug/min | INTRAVENOUS | Status: DC
  Filled 2014-11-07: qty 250

## 2014-11-07 MED ORDER — MAGNESIUM SULFATE 50 % IJ SOLN
40.0000 meq | INTRAMUSCULAR | Status: DC
  Filled 2014-11-07: qty 10

## 2014-11-07 MED ORDER — PAPAVERINE HCL 30 MG/ML IJ SOLN
INTRAMUSCULAR | Status: AC
  Administered 2014-11-08: 500 mL
  Filled 2014-11-07: qty 2.5

## 2014-11-07 MED ORDER — PHENYLEPHRINE HCL 10 MG/ML IJ SOLN
30.0000 ug/min | INTRAVENOUS | Status: AC
  Administered 2014-11-08: 10 ug/min via INTRAVENOUS
  Filled 2014-11-07: qty 2

## 2014-11-07 MED ORDER — SODIUM CHLORIDE 0.9 % IV SOLN
INTRAVENOUS | Status: AC
  Administered 2014-11-08: .9 [IU]/h via INTRAVENOUS
  Filled 2014-11-07: qty 2.5

## 2014-11-07 MED ORDER — VANCOMYCIN HCL 10 G IV SOLR
1250.0000 mg | INTRAVENOUS | Status: AC
  Administered 2014-11-08: 1250 mg via INTRAVENOUS
  Filled 2014-11-07: qty 1250

## 2014-11-07 MED ORDER — DOPAMINE-DEXTROSE 3.2-5 MG/ML-% IV SOLN
0.0000 ug/kg/min | INTRAVENOUS | Status: DC
  Filled 2014-11-07: qty 250

## 2014-11-07 NOTE — H&P (Signed)
[address]       [place] [address]             [id number]      Cardiothoracic Surgery History and Physical  PCP is Tracie Harrier, MD Referring Provider is Sherren Mocha, MD Primary Cardiologist: Jordan Hawks, MD  Chief Complaint  Patient presents with  . Severe Aortic Stenosis      .     HPI:  The patient is a 74 year old gentleman with a long history of a heart murmur who felt fine until 6 months ago when he began developing exertional shortness of breath and fatigue with some chest discomfort. His symptoms resolve with rest. His wife feels like he has not been able to do the things he used to be able to do like working in the yard and taking walks. An echo on 09/18/2014 reportedly showed a mean aortic valve gradient of 37 mm Hg and a peak of 64 mm Hg with a calcified and thickened aortic valve. LV function was normal. He was referred to Dr. Ubaldo Glassing and then to Dr. Burt Knack for consideration of TAVR.   He lives in [place] with his wife. He has 3 adult children. He has never been hospitalized. He continues to smoke 1/3 pack of cigarettes per day and has since he was old enough to smoke.  Past Medical History  Diagnosis Date  . BPH (benign prostatic hyperplasia)   . Asthma   . Aortic stenosis     EF 50% echo 2010  . Heart murmur     Past Surgical History  Procedure Laterality Date  . Cardiac catheterization N/A 10/19/2014    Procedure: Right/Left Heart Cath and Coronary Angiography; Surgeon: Sherren Mocha, MD; Location: [facility]; Service: Cardiovascular; Laterality: N/A;    Family History  Problem Relation Age of Onset  . Breast cancer Mother   . Lung cancer Mother   . Heart disease Father   . Diabetes Father     Social History Social History  Substance Use Topics  . Smoking status: Current Every Day Smoker -- 0.33 packs/day for 58 years    Types:  Cigarettes  . Smokeless tobacco: None  . Alcohol Use: None    Current Outpatient Prescriptions  Medication Sig Dispense Refill  . ferrous sulfate 325 (65 FE) MG tablet Take 1 tablet by mouth daily.    Marland Kitchen levocetirizine (XYZAL) 5 MG tablet Take 5 mg by mouth daily.    . montelukast (SINGULAIR) 10 MG tablet Take 10 mg by mouth at bedtime.    . Multiple Vitamin (MULTI-VITAMINS) TABS Take 1 tablet by mouth daily.    Marland Kitchen omeprazole (PRILOSEC) 20 MG capsule Take 20 mg by mouth daily.    . sildenafil (REVATIO) 20 MG tablet Take 20 mg by mouth 2 (two) times daily. Prn for Erectile dysfunction    . tamsulosin (FLOMAX) 0.4 MG CAPS capsule Take 0.4 mg by mouth daily.    . vitamin C (ASCORBIC ACID) 500 MG tablet Take 500 mg by mouth daily.    . vitamin E (VITAMIN E) 400 UNIT capsule Take 400 Units by mouth daily.     No current facility-administered medications for this visit.    Allergies  Allergen Reactions  . Penicillin G Other (See Comments)    Review of Systems  Constitutional: Positive for activity change, appetite change and fatigue.  HENT: Positive for hearing loss.   Dentures  Eyes: Negative.  Respiratory: Positive  for cough and shortness of breath.  Cardiovascular: Positive for chest pain. Negative for palpitations and leg swelling.  Gastrointestinal: Negative.  Endocrine: Negative.  Genitourinary: Positive for frequency.  Musculoskeletal: Negative.  Skin: Negative.  Allergic/Immunologic: Negative.  Neurological: Negative.  Hematological: Negative.  Psychiatric/Behavioral: Negative.    BP 133/77 mmHg  Pulse 66  Resp 16  Ht 6\' 2"  (1.88 m)  Wt 160 lb (72.576 kg)  BMI 20.53 kg/m2  SpO2 97% Physical Exam  Constitutional: He is oriented to person, place, and time. He appears well-developed and well-nourished. No distress.  HENT:  Head: Normocephalic and atraumatic.  Mouth/Throat:  Oropharynx is clear and moist.  Eyes: EOM are normal. Pupils are equal, round, and reactive to light.  Neck: Normal range of motion. Neck supple. No JVD present. No thyromegaly present.  Cardiovascular: Normal rate and regular rhythm.  3/6 harsh systolic murmur loudest along left lower sternal border  Pulmonary/Chest: Effort normal and breath sounds normal. No respiratory distress. He has no rales.  Abdominal: Soft. Bowel sounds are normal. He exhibits no distension and no mass. There is no tenderness.  Musculoskeletal: Normal range of motion. He exhibits no edema.  Lymphadenopathy:   He has no cervical adenopathy.  Neurological: He is alert and oriented to person, place, and time. He has normal strength. No cranial nerve deficit or sensory deficit.  Psychiatric: He has a normal mood and affect.     Diagnostic Tests:  Procedures    Right/Left Heart Cath and Coronary Angiography    Conclusion     Prox Cx lesion, 20% stenosed.  The left ventricular systolic function is normal.  1. Widely patent coronary arteries with minor nonobstructive stenosis of the proximal LCx, angiographically normal LAD, and angiographically normal RCA 2. Normal LV systolic function with normal LVEDP 3. Calcified aortic valve with Moderate-Severe aortic stenosis by hemodynamic assessment, with peak-to-peak gradient of 33 mmHg, Mean gradient of 30 mmHg, and calculated AVA 1.3 square cm, AVA index 0.67  Recommendations: Continued evaluation for aortic valve replacement in this patient with exertional angina, dyspnea, and patent coronary arteries. Clinical scenario highly suggestive of symptomatic aortic stenosis.     Indications    Severe aortic stenosis [I35.0 (ICD-10-CM)]    Technique and Indications    INDICATION: Symptomatic aortic stenosis  PROCEDURAL DETAILS: There was an indwelling IV in a right antecubital vein. Using normal sterile technique, the IV was changed out for a  5 Fr brachial sheath over a 0.018 inch wire. The right wrist was then prepped, draped, and anesthetized with 1% lidocaine. Using the modified Seldinger technique a 5/6 French Slender sheath was placed in the right radial artery. Intra-arterial verapamil was administered through the radial artery sheath. IV heparin was administered after a JR4 catheter was advanced into the central aorta. A Swan-Ganz catheter was used for the right heart catheterization. Standard protocol was followed for recording of right heart pressures and sampling of oxygen saturations. Fick cardiac output was calculated. Standard Judkins catheters were used for selective coronary angiography, aortic root angiography, and left ventriculography. There were no immediate procedural complications. The patient was transferred to the post catheterization recovery area for further monitoring.    Estimated blood loss <50 mL. There were no immediate complications during the procedure.    Coronary Findings    Dominance: Right   Left Anterior Descending  The vessel is angiographically normal.     Left Circumflex   . Prox Cx lesion, 20% stenosed.     Right Coronary Artery  The vessel is angiographically normal.       Right Heart Pressures Hemodynamic findings consistent with aortic stenosis. LV EDP is normal.    Wall Motion                 Left Heart    Left Ventricle The left ventricular size is normal. The left ventricular systolic function is normal. The left ventricular ejection fraction is 55-65% by visual estimate. There are no wall motion abnormalities in the left ventricle.    Coronary Diagrams    Diagnostic Diagram            Implants    Name ID Temporary Type Supply   No information to display    PACS Images    Show images for Cardiac catheterization     Link to Procedure Log    Procedure Log      Hemo Data        Most Recent Value   Fick Cardiac Output  7.16 L/min   Fick Cardiac Output Index  3.62 (L/min)/BSA   Aortic Mean Gradient  29.8 mmHg   Aortic Peak Gradient  33 mmHg   Aortic Valve Area  1.32   Aortic Value Area Index  0.67 cm2/BSA   RA A Wave  5 mmHg   RA V Wave  2 mmHg   RA Mean  1 mmHg   RV Systolic Pressure  24 mmHg   RV Diastolic Pressure  -1 mmHg   RV EDP  2 mmHg   PA Systolic Pressure  23 mmHg   PA Diastolic Pressure  5 mmHg   PA Mean  13 mmHg   PW A Wave  12 mmHg   PW V Wave  11 mmHg   PW Mean  7 mmHg   AO Systolic Pressure  94 mmHg   AO Diastolic Pressure  51 mmHg   AO Mean  69 mmHg   LV Systolic Pressure  841 mmHg   LV Diastolic Pressure  0 mmHg   LV EDP  5 mmHg   Arterial Occlusion Pressure Extended Systolic Pressure  97 mmHg   Arterial Occlusion Pressure Extended Diastolic Pressure  47 mmHg   Arterial Occlusion Pressure Extended Mean Pressure  66 mmHg   Left Ventricular Apex Extended Systolic Pressure  660 mmHg   Left Ventricular Apex Extended Diastolic Pressure  4 mmHg   Left Ventricular Apex Extended EDP Pressure  8 mmHg   QP/QS  1   TPVR Index  3.6 HRUI   TSVR Index  19.09 HRUI   PVR SVR Ratio  0.09   TPVR/TSVR Ratio  0.19    Order-Level Documents:    There are no order-level documents.    Encounter-Level Documents - 10/11/14:      Scan on 10/23/2014 10:37 AM by Provider Default, Wren on 10/23/2014 10:37 AM by Provider Default, MD     Scan on 10/23/2014 10:33 AM by Provider Default, MDScan on 10/23/2014 10:33 AM by Provider Default, MD     Scan on 10/19/2014 8:40 AM by Provider Default, MDScan on 10/19/2014 8:40 AM by Provider Default, MD     Electronic signature on 10/19/2014 5:18 AM    Signed    Electronically signed by Sherren Mocha, MD on  10/19/14 at [id number] EDT    STS Risk Calculator: Procedure: AV Replacement  Risk of Mortality: 2.052%  Morbidity or Mortality: 15.769%  Long Length of Stay: 6.653%  Short Length of Stay: 40.657%  Permanent Stroke: 0.978%  Prolonged Ventilation:  9.317%  DSW Infection: 0.397%  Renal Failure: 3.173%  Reoperation: 7.618%   Impression:  He has stage D severe symptomatic aortic stenosis with progressive exertional shortness of breath and fatigue, NYHA class Ill. He has no significant coronary disease and normal LV function. I agree that AVR is indicated in this patient for relief of his symptoms and to prevent LV deterioration. I have reviewed the natural history of aortic stenosis with the patient and his wife who is present today. We have discussed the limitations of medical therapy and the poor prognosis associated with symptomatic aortic stenosis. We have also reviewed potential treatment options, including palliative medical therapy, conventional surgical aortic valve replacement, and transcatheter aortic valve replacement. His STS PROM is only about 2% and probably less than that so he is in the low risk group that are not candidates for TAVR at this time. I discussed the reasons for this with he and his wife and they understand. I think he will do well with open surgical AVR. I discussed the operative procedure with the patient and his wife including alternatives, benefits and risks; including but not limited to bleeding, blood transfusion, infection, stroke, myocardial infarction, graft failure, heart block requiring a permanent pacemaker, organ dysfunction, and death. I would plan to use a tissue valve at his age. Jory Sims Sr. understands and agrees to proceed.   Plan:  AVR with a tissue valve  Gaye Pollack, MD [facility] [phone]

## 2014-11-08 ENCOUNTER — Other Ambulatory Visit: Payer: Self-pay

## 2014-11-08 ENCOUNTER — Inpatient Hospital Stay (HOSPITAL_COMMUNITY)
Admission: RE | Admit: 2014-11-08 | Discharge: 2014-11-12 | DRG: 221 | Disposition: A | Discharge status: Home / Self Care | Payer: Commercial Managed Care - HMO | Source: Ambulatory Visit | Attending: Surgery | Admitting: Surgery

## 2014-11-08 ENCOUNTER — Inpatient Hospital Stay (HOSPITAL_COMMUNITY): Payer: Commercial Managed Care - HMO | Admitting: Certified Registered"

## 2014-11-08 ENCOUNTER — Encounter (HOSPITAL_COMMUNITY): Payer: Self-pay | Admitting: *Deleted

## 2014-11-08 ENCOUNTER — Inpatient Hospital Stay (HOSPITAL_COMMUNITY)
Admission: RE | Admit: 2014-11-08 | Discharge: 2014-11-08 | Disposition: A | Discharge status: Home / Self Care | Payer: Commercial Managed Care - HMO | Source: Ambulatory Visit | Attending: Surgery | Admitting: Surgery

## 2014-11-08 ENCOUNTER — Encounter (HOSPITAL_COMMUNITY)
Admission: RE | Disposition: A | Discharge status: Home / Self Care | Payer: Commercial Managed Care - HMO | Source: Ambulatory Visit | Attending: Surgery

## 2014-11-08 ENCOUNTER — Inpatient Hospital Stay (HOSPITAL_COMMUNITY): Payer: Commercial Managed Care - HMO

## 2014-11-08 DIAGNOSIS — F17210 Nicotine dependence, cigarettes, uncomplicated: Secondary | ICD-10-CM | POA: Diagnosis present

## 2014-11-08 DIAGNOSIS — I350 Nonrheumatic aortic (valve) stenosis: Principal | ICD-10-CM | POA: Diagnosis present

## 2014-11-08 DIAGNOSIS — N400 Enlarged prostate without lower urinary tract symptoms: Secondary | ICD-10-CM | POA: Diagnosis present

## 2014-11-08 DIAGNOSIS — Z9889 Other specified postprocedural states: Secondary | ICD-10-CM | POA: Insufficient documentation

## 2014-11-08 DIAGNOSIS — J45909 Unspecified asthma, uncomplicated: Secondary | ICD-10-CM | POA: Diagnosis present

## 2014-11-08 DIAGNOSIS — Z79899 Other long term (current) drug therapy: Secondary | ICD-10-CM | POA: Diagnosis not present

## 2014-11-08 DIAGNOSIS — Z951 Presence of aortocoronary bypass graft: Secondary | ICD-10-CM

## 2014-11-08 DIAGNOSIS — Z952 Presence of prosthetic heart valve: Secondary | ICD-10-CM

## 2014-11-08 DIAGNOSIS — K219 Gastro-esophageal reflux disease without esophagitis: Secondary | ICD-10-CM | POA: Diagnosis present

## 2014-11-08 HISTORY — PX: AORTIC VALVE REPLACEMENT: SHX41

## 2014-11-08 HISTORY — PX: TEE WITHOUT CARDIOVERSION: SHX5443

## 2014-11-08 LAB — POCT I-STAT, CHEM 8
BUN: 19 mg/dL (ref 6–20)
BUN: 18 mg/dL (ref 6–20)
BUN: 17 mg/dL (ref 6–20)
BUN: 17 mg/dL (ref 6–20)
BUN: 18 mg/dL (ref 6–20)
BUN: 18 mg/dL (ref 6–20)
CALCIUM ION: 1.22 mmol/L (ref 1.13–1.30)
CALCIUM ION: 1.22 mmol/L (ref 1.13–1.30)
CALCIUM ION: 1.15 mmol/L (ref 1.13–1.30)
CHLORIDE: 106 mmol/L (ref 101–111)
CHLORIDE: 103 mmol/L (ref 101–111)
CHLORIDE: 104 mmol/L (ref 101–111)
CHLORIDE: 107 mmol/L (ref 101–111)
CREATININE: 1 mg/dL (ref 0.61–1.24)
CREATININE: 1.1 mg/dL (ref 0.61–1.24)
Calcium, Ion: 1.08 mmol/L — ABNORMAL LOW (ref 1.13–1.30)
Calcium, Ion: 1.07 mmol/L — ABNORMAL LOW (ref 1.13–1.30)
Calcium, Ion: 1.07 mmol/L — ABNORMAL LOW (ref 1.13–1.30)
Chloride: 105 mmol/L (ref 101–111)
Chloride: 101 mmol/L (ref 101–111)
Creatinine, Ser: 1 mg/dL (ref 0.61–1.24)
Creatinine, Ser: 1 mg/dL (ref 0.61–1.24)
Creatinine, Ser: 0.9 mg/dL (ref 0.61–1.24)
Creatinine, Ser: 1.1 mg/dL (ref 0.61–1.24)
GLUCOSE: 107 mg/dL — AB (ref 65–99)
GLUCOSE: 108 mg/dL — AB (ref 65–99)
GLUCOSE: 139 mg/dL — AB (ref 65–99)
Glucose, Bld: 94 mg/dL (ref 65–99)
Glucose, Bld: 112 mg/dL — ABNORMAL HIGH (ref 65–99)
Glucose, Bld: 118 mg/dL — ABNORMAL HIGH (ref 65–99)
HCT: 42 % (ref 39.0–52.0)
HCT: 40 % (ref 39.0–52.0)
HCT: 33 % — ABNORMAL LOW (ref 39.0–52.0)
HCT: 44 % (ref 39.0–52.0)
HEMATOCRIT: 33 % — AB (ref 39.0–52.0)
HEMATOCRIT: 33 % — AB (ref 39.0–52.0)
HEMOGLOBIN: 11.2 g/dL — AB (ref 13.0–17.0)
Hemoglobin: 14.3 g/dL (ref 13.0–17.0)
Hemoglobin: 13.6 g/dL (ref 13.0–17.0)
Hemoglobin: 11.2 g/dL — ABNORMAL LOW (ref 13.0–17.0)
Hemoglobin: 11.2 g/dL — ABNORMAL LOW (ref 13.0–17.0)
Hemoglobin: 15 g/dL (ref 13.0–17.0)
POTASSIUM: 5.6 mmol/L — AB (ref 3.5–5.1)
POTASSIUM: 5.7 mmol/L — AB (ref 3.5–5.1)
POTASSIUM: 6.6 mmol/L — AB (ref 3.5–5.1)
Potassium: 4.4 mmol/L (ref 3.5–5.1)
Potassium: 4.6 mmol/L (ref 3.5–5.1)
Potassium: 5.1 mmol/L (ref 3.5–5.1)
SODIUM: 139 mmol/L (ref 135–145)
SODIUM: 136 mmol/L (ref 135–145)
SODIUM: 140 mmol/L (ref 135–145)
Sodium: 140 mmol/L (ref 135–145)
Sodium: 135 mmol/L (ref 135–145)
Sodium: 138 mmol/L (ref 135–145)
TCO2: 27 mmol/L (ref 0–100)
TCO2: 28 mmol/L (ref 0–100)
TCO2: 26 mmol/L (ref 0–100)
TCO2: 26 mmol/L (ref 0–100)
TCO2: 26 mmol/L (ref 0–100)
TCO2: 19 mmol/L (ref 0–100)

## 2014-11-08 LAB — POCT I-STAT 3, ART BLOOD GAS (G3+)
ACID-BASE DEFICIT: 1 mmol/L (ref 0.0–2.0)
Acid-Base Excess: 1 mmol/L (ref 0.0–2.0)
Acid-Base Excess: 2 mmol/L (ref 0.0–2.0)
Acid-base deficit: 5 mmol/L — ABNORMAL HIGH (ref 0.0–2.0)
Acid-base deficit: 6 mmol/L — ABNORMAL HIGH (ref 0.0–2.0)
BICARBONATE: 26.1 meq/L — AB (ref 20.0–24.0)
BICARBONATE: 28.3 meq/L — AB (ref 20.0–24.0)
BICARBONATE: 26.9 meq/L — AB (ref 20.0–24.0)
BICARBONATE: 21 meq/L (ref 20.0–24.0)
BICARBONATE: 20.5 meq/L (ref 20.0–24.0)
O2 SAT: 99 %
O2 SAT: 98 %
O2 Saturation: 100 %
O2 Saturation: 100 %
O2 Saturation: 97 %
PCO2 ART: 53.4 mmHg — AB (ref 35.0–45.0)
PCO2 ART: 54.1 mmHg — AB (ref 35.0–45.0)
PCO2 ART: 40.7 mmHg (ref 35.0–45.0)
PCO2 ART: 39.9 mmHg (ref 35.0–45.0)
PH ART: 7.425 (ref 7.350–7.450)
PH ART: 7.334 — AB (ref 7.350–7.450)
PO2 ART: 148 mmHg — AB (ref 80.0–100.0)
PO2 ART: 98 mmHg (ref 80.0–100.0)
PO2 ART: 120 mmHg — AB (ref 80.0–100.0)
Patient temperature: 37.3
Patient temperature: 36.3
Patient temperature: 36.2
Patient temperature: 97.3
TCO2: 27 mmol/L (ref 0–100)
TCO2: 30 mmol/L (ref 0–100)
TCO2: 29 mmol/L (ref 0–100)
TCO2: 22 mmol/L (ref 0–100)
TCO2: 22 mmol/L (ref 0–100)
pCO2 arterial: 38.5 mmHg (ref 35.0–45.0)
pH, Arterial: 7.301 — ABNORMAL LOW (ref 7.350–7.450)
pH, Arterial: 7.317 — ABNORMAL LOW (ref 7.350–7.450)
pH, Arterial: 7.315 — ABNORMAL LOW (ref 7.350–7.450)
pO2, Arterial: 328 mmHg — ABNORMAL HIGH (ref 80.0–100.0)
pO2, Arterial: 421 mmHg — ABNORMAL HIGH (ref 80.0–100.0)

## 2014-11-08 LAB — GLUCOSE, CAPILLARY
GLUCOSE-CAPILLARY: 125 mg/dL — AB (ref 65–99)
GLUCOSE-CAPILLARY: 132 mg/dL — AB (ref 65–99)
Glucose-Capillary: 114 mg/dL — ABNORMAL HIGH (ref 65–99)
Glucose-Capillary: 132 mg/dL — ABNORMAL HIGH (ref 65–99)
Glucose-Capillary: 129 mg/dL — ABNORMAL HIGH (ref 65–99)

## 2014-11-08 LAB — HEMOGLOBIN AND HEMATOCRIT, BLOOD
HCT: 33.2 % — ABNORMAL LOW (ref 39.0–52.0)
Hemoglobin: 10.9 g/dL — ABNORMAL LOW (ref 13.0–17.0)

## 2014-11-08 LAB — POCT I-STAT 4, (NA,K, GLUC, HGB,HCT)
Glucose, Bld: 102 mg/dL — ABNORMAL HIGH (ref 65–99)
HEMATOCRIT: 37 % — AB (ref 39.0–52.0)
HEMOGLOBIN: 12.6 g/dL — AB (ref 13.0–17.0)
Potassium: 4.5 mmol/L (ref 3.5–5.1)
Sodium: 141 mmol/L (ref 135–145)

## 2014-11-08 LAB — POCT I-STAT 3, VENOUS BLOOD GAS (G3P V)
ACID-BASE DEFICIT: 1 mmol/L (ref 0.0–2.0)
Bicarbonate: 24.9 mEq/L — ABNORMAL HIGH (ref 20.0–24.0)
O2 SAT: 84 %
PCO2 VEN: 38.7 mmHg — AB (ref 45.0–50.0)
Patient temperature: 34.4
TCO2: 26 mmol/L (ref 0–100)
pH, Ven: 7.405 — ABNORMAL HIGH (ref 7.250–7.300)
pO2, Ven: 42 mmHg (ref 30.0–45.0)

## 2014-11-08 LAB — MAGNESIUM: MAGNESIUM: 2.7 mg/dL — AB (ref 1.7–2.4)

## 2014-11-08 LAB — CBC
HCT: 37.1 % — ABNORMAL LOW (ref 39.0–52.0)
HEMATOCRIT: 41.6 % (ref 39.0–52.0)
HEMOGLOBIN: 13.4 g/dL (ref 13.0–17.0)
Hemoglobin: 11.9 g/dL — ABNORMAL LOW (ref 13.0–17.0)
MCH: 31.7 pg (ref 26.0–34.0)
MCH: 31.8 pg (ref 26.0–34.0)
MCHC: 32.1 g/dL (ref 30.0–36.0)
MCHC: 32.2 g/dL (ref 30.0–36.0)
MCV: 98.9 fL (ref 78.0–100.0)
MCV: 98.8 fL (ref 78.0–100.0)
PLATELETS: 172 10*3/uL (ref 150–400)
Platelets: 202 10*3/uL (ref 150–400)
RBC: 3.75 MIL/uL — AB (ref 4.22–5.81)
RBC: 4.21 MIL/uL — AB (ref 4.22–5.81)
RDW: 13 % (ref 11.5–15.5)
RDW: 13 % (ref 11.5–15.5)
WBC: 18.2 10*3/uL — ABNORMAL HIGH (ref 4.0–10.5)
WBC: 20.6 10*3/uL — AB (ref 4.0–10.5)

## 2014-11-08 LAB — APTT: aPTT: 42 seconds — ABNORMAL HIGH (ref 24–37)

## 2014-11-08 LAB — CREATININE, SERUM
CREATININE: 1.26 mg/dL — AB (ref 0.61–1.24)
GFR, EST NON AFRICAN AMERICAN: 54 mL/min — AB (ref >60)

## 2014-11-08 LAB — PROTIME-INR
INR: 1.49 (ref 0.00–1.49)
PROTHROMBIN TIME: 18.1 s — AB (ref 11.6–15.2)

## 2014-11-08 LAB — PLATELET COUNT: PLATELETS: 251 10*3/uL (ref 150–400)

## 2014-11-08 SURGERY — REPLACEMENT, AORTIC VALVE, OPEN
Anesthesia: General | Site: Chest

## 2014-11-08 MED ORDER — FENTANYL CITRATE (PF) 250 MCG/5ML IJ SOLN
INTRAMUSCULAR | Status: AC
  Filled 2014-11-08: qty 5

## 2014-11-08 MED ORDER — CEFUROXIME SODIUM 1.5 G IJ SOLR: 1.5000 g | Freq: Two times a day (BID) | INTRAMUSCULAR | Status: DC

## 2014-11-08 MED ORDER — SODIUM CHLORIDE 0.9 % IJ SOLN
INTRAMUSCULAR | Status: AC
  Filled 2014-11-08: qty 10

## 2014-11-08 MED ORDER — HEPARIN SODIUM (PORCINE) 1000 UNIT/ML IJ SOLN
INTRAMUSCULAR | Status: AC
  Filled 2014-11-08: qty 1

## 2014-11-08 MED ORDER — METOPROLOL TARTRATE 12.5 MG HALF TABLET
12.5000 mg | ORAL_TABLET | Freq: Once | ORAL | Status: AC
  Administered 2014-11-08: 12.5 mg via ORAL
  Filled 2014-11-08: qty 1

## 2014-11-08 MED ORDER — TRAMADOL HCL 50 MG PO TABS
50.0000 mg | ORAL_TABLET | ORAL | Status: DC | PRN
  Administered 2014-11-09: 100 mg via ORAL
  Filled 2014-11-08: qty 2

## 2014-11-08 MED ORDER — OXYCODONE HCL 5 MG PO TABS
5.0000 mg | ORAL_TABLET | ORAL | Status: DC | PRN
  Administered 2014-11-09 (×2): 10 mg via ORAL
  Filled 2014-11-08 (×2): qty 2

## 2014-11-08 MED ORDER — HEPARIN SODIUM (PORCINE) 1000 UNIT/ML IJ SOLN
INTRAMUSCULAR | Status: DC | PRN
  Administered 2014-11-08: 34000 [IU] via INTRAVENOUS

## 2014-11-08 MED ORDER — ARTIFICIAL TEARS OP OINT
TOPICAL_OINTMENT | OPHTHALMIC | Status: DC | PRN
  Administered 2014-11-08: 1 via OPHTHALMIC

## 2014-11-08 MED ORDER — INSULIN REGULAR BOLUS VIA INFUSION
0.0000 [IU] | Freq: Three times a day (TID) | INTRAVENOUS | Status: DC
  Filled 2014-11-08: qty 10

## 2014-11-08 MED ORDER — 0.9 % SODIUM CHLORIDE (POUR BTL) OPTIME
TOPICAL | Status: DC | PRN
  Administered 2014-11-08: 1000 mL
  Administered 2014-11-08: 5000 mL
  Administered 2014-11-08: 1000 mL

## 2014-11-08 MED ORDER — SODIUM CHLORIDE 0.9 % IJ SOLN
3.0000 mL | INTRAMUSCULAR | Status: DC | PRN
Start: 2014-11-09 — End: 2014-11-09

## 2014-11-08 MED ORDER — ANTISEPTIC ORAL RINSE SOLUTION (CORINZ)
7.0000 mL | Freq: Four times a day (QID) | OROMUCOSAL | Status: DC
  Administered 2014-11-09 (×2): 7 mL via OROMUCOSAL

## 2014-11-08 MED ORDER — ASPIRIN EC 325 MG PO TBEC
325.0000 mg | DELAYED_RELEASE_TABLET | Freq: Every day | ORAL | Status: DC
  Filled 2014-11-08: qty 1

## 2014-11-08 MED ORDER — LEVOFLOXACIN IN D5W 500 MG/100ML IV SOLN
500.0000 mg | Freq: Once | INTRAVENOUS | Status: AC
  Administered 2014-11-09: 500 mg via INTRAVENOUS
  Filled 2014-11-08: qty 100

## 2014-11-08 MED ORDER — NITROGLYCERIN IN D5W 200-5 MCG/ML-% IV SOLN: 0.0000 ug/min | INTRAVENOUS | Status: DC

## 2014-11-08 MED ORDER — MORPHINE SULFATE (PF) 2 MG/ML IV SOLN
1.0000 mg | INTRAVENOUS | Status: DC | PRN
  Administered 2014-11-08: 4 mg via INTRAVENOUS
  Administered 2014-11-08: 2 mg via INTRAVENOUS
  Filled 2014-11-08: qty 1

## 2014-11-08 MED ORDER — METOPROLOL TARTRATE 25 MG/10 ML ORAL SUSPENSION
12.5000 mg | Freq: Two times a day (BID) | ORAL | Status: DC
  Filled 2014-11-08 (×3): qty 5

## 2014-11-08 MED ORDER — PROPOFOL 10 MG/ML IV BOLUS
INTRAVENOUS | Status: AC
  Filled 2014-11-08: qty 20

## 2014-11-08 MED ORDER — METOPROLOL TARTRATE 1 MG/ML IV SOLN: 2.5000 mg | INTRAVENOUS | Status: DC | PRN

## 2014-11-08 MED ORDER — DEXMEDETOMIDINE HCL IN NACL 200 MCG/50ML IV SOLN: 0.0000 ug/kg/h | INTRAVENOUS | Status: DC

## 2014-11-08 MED ORDER — ONDANSETRON HCL 4 MG/2ML IJ SOLN
4.0000 mg | Freq: Four times a day (QID) | INTRAMUSCULAR | Status: DC | PRN
  Administered 2014-11-08 – 2014-11-09 (×2): 4 mg via INTRAVENOUS
  Filled 2014-11-08 (×2): qty 2

## 2014-11-08 MED ORDER — FENTANYL CITRATE (PF) 100 MCG/2ML IJ SOLN
INTRAMUSCULAR | Status: DC | PRN
  Administered 2014-11-08: 500 ug via INTRAVENOUS
  Administered 2014-11-08: 50 ug via INTRAVENOUS
  Administered 2014-11-08 (×2): 250 ug via INTRAVENOUS
  Administered 2014-11-08: 450 ug via INTRAVENOUS
  Administered 2014-11-08: 250 ug via INTRAVENOUS

## 2014-11-08 MED ORDER — MIDAZOLAM HCL 2 MG/2ML IJ SOLN
INTRAMUSCULAR | Status: AC
Start: 2014-11-08 — End: 2014-11-08
  Filled 2014-11-08: qty 4

## 2014-11-08 MED ORDER — MORPHINE SULFATE (PF) 2 MG/ML IV SOLN
2.0000 mg | INTRAVENOUS | Status: DC | PRN
  Administered 2014-11-08 – 2014-11-09 (×3): 2 mg via INTRAVENOUS
  Filled 2014-11-08 (×3): qty 1
  Filled 2014-11-08: qty 2

## 2014-11-08 MED ORDER — SODIUM CHLORIDE 0.9 % IV SOLN
INTRAVENOUS | Status: DC | PRN
  Administered 2014-11-08: 11:00:00 via INTRAVENOUS

## 2014-11-08 MED ORDER — TAMSULOSIN HCL 0.4 MG PO CAPS
0.4000 mg | ORAL_CAPSULE | Freq: Every day | ORAL | Status: DC
  Administered 2014-11-09 – 2014-11-12 (×5): 0.4 mg via ORAL
  Filled 2014-11-08 (×5): qty 1

## 2014-11-08 MED ORDER — CHLORHEXIDINE GLUCONATE 0.12 % MT SOLN
OROMUCOSAL | Status: AC
  Filled 2014-11-08: qty 15

## 2014-11-08 MED ORDER — VECURONIUM BROMIDE 10 MG IV SOLR
INTRAVENOUS | Status: AC
  Filled 2014-11-08: qty 20

## 2014-11-08 MED ORDER — PROPOFOL 10 MG/ML IV BOLUS
INTRAVENOUS | Status: DC | PRN
  Administered 2014-11-08 (×2): 50 mg via INTRAVENOUS

## 2014-11-08 MED ORDER — CHLORHEXIDINE GLUCONATE 0.12 % MT SOLN: 15.0000 mL | Freq: Once | OROMUCOSAL | Status: DC

## 2014-11-08 MED ORDER — ACETAMINOPHEN 650 MG RE SUPP
650.0000 mg | Freq: Once | RECTAL | Status: AC
  Administered 2014-11-08: 650 mg via RECTAL

## 2014-11-08 MED ORDER — SODIUM CHLORIDE 0.9 % IV SOLN
250.0000 mL | INTRAVENOUS | Status: DC
  Administered 2014-11-09: 250 mL via INTRAVENOUS

## 2014-11-08 MED ORDER — LACTATED RINGERS IV SOLN: 500.0000 mL | Freq: Once | INTRAVENOUS | Status: DC | PRN

## 2014-11-08 MED ORDER — CHLORHEXIDINE GLUCONATE 4 % EX LIQD: 30.0000 mL | CUTANEOUS | Status: DC

## 2014-11-08 MED ORDER — SODIUM CHLORIDE 0.9 % IJ SOLN
3.0000 mL | Freq: Two times a day (BID) | INTRAMUSCULAR | Status: DC
  Administered 2014-11-09: 3 mL via INTRAVENOUS

## 2014-11-08 MED ORDER — SODIUM CHLORIDE 0.9 % IV SOLN
INTRAVENOUS | Status: DC
  Administered 2014-11-08: 12:00:00 via INTRAVENOUS

## 2014-11-08 MED ORDER — HEMOSTATIC AGENTS (NO CHARGE) OPTIME
TOPICAL | Status: DC | PRN
  Administered 2014-11-08 (×2): 1 via TOPICAL

## 2014-11-08 MED ORDER — METOPROLOL TARTRATE 12.5 MG HALF TABLET
12.5000 mg | ORAL_TABLET | Freq: Two times a day (BID) | ORAL | Status: DC
  Filled 2014-11-08 (×3): qty 1

## 2014-11-08 MED ORDER — ACETAMINOPHEN 160 MG/5ML PO SOLN: 650.0000 mg | Freq: Once | ORAL | Status: AC

## 2014-11-08 MED ORDER — PROTAMINE SULFATE 10 MG/ML IV SOLN
INTRAVENOUS | Status: DC | PRN
  Administered 2014-11-08: 330 mg via INTRAVENOUS

## 2014-11-08 MED ORDER — LACTATED RINGERS IV SOLN
INTRAVENOUS | Status: DC
  Administered 2014-11-08: 13:00:00 via INTRAVENOUS

## 2014-11-08 MED ORDER — THROMBIN 20000 UNITS EX SOLR
CUTANEOUS | Status: AC
  Filled 2014-11-08: qty 20000

## 2014-11-08 MED ORDER — ACETAMINOPHEN 500 MG PO TABS
1000.0000 mg | ORAL_TABLET | Freq: Four times a day (QID) | ORAL | Status: DC
  Administered 2014-11-09 (×2): 1000 mg via ORAL
  Filled 2014-11-08 (×5): qty 2

## 2014-11-08 MED ORDER — MAGNESIUM SULFATE 4 GM/100ML IV SOLN
4.0000 g | Freq: Once | INTRAVENOUS | Status: AC
  Administered 2014-11-08: 4 g via INTRAVENOUS
  Filled 2014-11-08: qty 100

## 2014-11-08 MED ORDER — PANTOPRAZOLE SODIUM 40 MG PO TBEC: 40.0000 mg | DELAYED_RELEASE_TABLET | Freq: Every day | ORAL | Status: DC

## 2014-11-08 MED ORDER — PROTAMINE SULFATE 10 MG/ML IV SOLN
INTRAVENOUS | Status: AC
  Filled 2014-11-08: qty 5

## 2014-11-08 MED ORDER — ROCURONIUM BROMIDE 100 MG/10ML IV SOLN
INTRAVENOUS | Status: DC | PRN
  Administered 2014-11-08: 50 mg via INTRAVENOUS

## 2014-11-08 MED ORDER — DOCUSATE SODIUM 100 MG PO CAPS
200.0000 mg | ORAL_CAPSULE | Freq: Every day | ORAL | Status: DC
  Administered 2014-11-09: 200 mg via ORAL
  Filled 2014-11-08: qty 2

## 2014-11-08 MED ORDER — LACTATED RINGERS IV SOLN
INTRAVENOUS | Status: DC | PRN
  Administered 2014-11-08: 07:00:00 via INTRAVENOUS

## 2014-11-08 MED ORDER — BISACODYL 10 MG RE SUPP: 10.0000 mg | Freq: Every day | RECTAL | Status: DC

## 2014-11-08 MED ORDER — MIDAZOLAM HCL 5 MG/5ML IJ SOLN
INTRAMUSCULAR | Status: DC | PRN
  Administered 2014-11-08: 1 mg via INTRAVENOUS
  Administered 2014-11-08: 2 mg via INTRAVENOUS
  Administered 2014-11-08: 3 mg via INTRAVENOUS
  Administered 2014-11-08 (×2): 4 mg via INTRAVENOUS

## 2014-11-08 MED ORDER — ALBUMIN HUMAN 5 % IV SOLN
250.0000 mL | INTRAVENOUS | Status: DC | PRN
  Administered 2014-11-08: 250 mL via INTRAVENOUS
  Filled 2014-11-08: qty 250

## 2014-11-08 MED ORDER — PHENYLEPHRINE HCL 10 MG/ML IJ SOLN
0.0000 ug/min | INTRAVENOUS | Status: DC
  Filled 2014-11-08: qty 2

## 2014-11-08 MED ORDER — BISACODYL 5 MG PO TBEC
10.0000 mg | DELAYED_RELEASE_TABLET | Freq: Every day | ORAL | Status: DC
  Administered 2014-11-09: 10 mg via ORAL
  Filled 2014-11-08: qty 2

## 2014-11-08 MED ORDER — PHENYLEPHRINE 40 MCG/ML (10ML) SYRINGE FOR IV PUSH (FOR BLOOD PRESSURE SUPPORT)
PREFILLED_SYRINGE | INTRAVENOUS | Status: AC
  Filled 2014-11-08: qty 10

## 2014-11-08 MED ORDER — POTASSIUM CHLORIDE 10 MEQ/50ML IV SOLN: 10.0000 meq | INTRAVENOUS | Status: AC

## 2014-11-08 MED ORDER — STERILE WATER FOR INJECTION IJ SOLN
INTRAMUSCULAR | Status: AC
  Filled 2014-11-08: qty 10

## 2014-11-08 MED ORDER — THROMBIN 20000 UNITS EX SOLR
CUTANEOUS | Status: DC | PRN
  Administered 2014-11-08: 20 mL via TOPICAL

## 2014-11-08 MED ORDER — SODIUM CHLORIDE 0.9 % IV SOLN
INTRAVENOUS | Status: DC
  Filled 2014-11-08: qty 2.5

## 2014-11-08 MED ORDER — VANCOMYCIN HCL IN DEXTROSE 1-5 GM/200ML-% IV SOLN
1000.0000 mg | Freq: Once | INTRAVENOUS | Status: AC
  Administered 2014-11-08: 1000 mg via INTRAVENOUS
  Filled 2014-11-08: qty 200

## 2014-11-08 MED ORDER — LACTATED RINGERS IV SOLN: INTRAVENOUS | Status: DC

## 2014-11-08 MED ORDER — ALBUMIN HUMAN 5 % IV SOLN
INTRAVENOUS | Status: DC | PRN
  Administered 2014-11-08: 11:00:00 via INTRAVENOUS

## 2014-11-08 MED ORDER — SODIUM CHLORIDE 0.9 % IJ SOLN
OROMUCOSAL | Status: DC | PRN
  Administered 2014-11-08: 8 mL via TOPICAL
  Administered 2014-11-08: 4 mL via TOPICAL

## 2014-11-08 MED ORDER — FAMOTIDINE IN NACL 20-0.9 MG/50ML-% IV SOLN
20.0000 mg | Freq: Two times a day (BID) | INTRAVENOUS | Status: DC
  Administered 2014-11-08: 20 mg via INTRAVENOUS

## 2014-11-08 MED ORDER — ARTIFICIAL TEARS OP OINT
TOPICAL_OINTMENT | OPHTHALMIC | Status: AC
  Filled 2014-11-08: qty 3.5

## 2014-11-08 MED ORDER — SODIUM CHLORIDE 0.45 % IV SOLN
INTRAVENOUS | Status: DC | PRN
Start: 2014-11-08 — End: 2014-11-09

## 2014-11-08 MED ORDER — VECURONIUM BROMIDE 10 MG IV SOLR
INTRAVENOUS | Status: DC | PRN
  Administered 2014-11-08 (×3): 5 mg via INTRAVENOUS

## 2014-11-08 MED ORDER — MIDAZOLAM HCL 2 MG/2ML IJ SOLN: 2.0000 mg | INTRAMUSCULAR | Status: DC | PRN

## 2014-11-08 MED ORDER — ASPIRIN 81 MG PO CHEW: 324.0000 mg | CHEWABLE_TABLET | Freq: Every day | ORAL | Status: DC

## 2014-11-08 MED ORDER — MIDAZOLAM HCL 10 MG/2ML IJ SOLN
INTRAMUSCULAR | Status: AC
  Filled 2014-11-08: qty 4

## 2014-11-08 MED ORDER — THROMBIN 5000 UNITS EX SOLR
CUTANEOUS | Status: AC
  Filled 2014-11-08: qty 5000

## 2014-11-08 MED ORDER — ROCURONIUM BROMIDE 50 MG/5ML IV SOLN
INTRAVENOUS | Status: AC
  Filled 2014-11-08: qty 1

## 2014-11-08 MED ORDER — LIDOCAINE HCL (CARDIAC) 20 MG/ML IV SOLN
INTRAVENOUS | Status: DC | PRN
  Administered 2014-11-08: 100 mg via INTRAVENOUS

## 2014-11-08 MED ORDER — ACETAMINOPHEN 160 MG/5ML PO SOLN: 1000.0000 mg | Freq: Four times a day (QID) | ORAL | Status: DC

## 2014-11-08 MED ORDER — PHENYLEPHRINE HCL 10 MG/ML IJ SOLN
INTRAMUSCULAR | Status: DC | PRN
  Administered 2014-11-08 (×2): 40 ug via INTRAVENOUS

## 2014-11-08 MED ORDER — CHLORHEXIDINE GLUCONATE 0.12% ORAL RINSE (MEDLINE KIT)
15.0000 mL | Freq: Two times a day (BID) | OROMUCOSAL | Status: DC
  Administered 2014-11-08 – 2014-11-11 (×3): 15 mL via OROMUCOSAL

## 2014-11-08 MED FILL — Electrolyte-R (PH 7.4) Solution: INTRAVENOUS | Qty: 4000 | Status: AC

## 2014-11-08 MED FILL — Lidocaine HCl IV Inj 20 MG/ML: INTRAVENOUS | Qty: 5 | Status: AC

## 2014-11-08 MED FILL — Heparin Sodium (Porcine) Inj 1000 Unit/ML: INTRAMUSCULAR | Qty: 10 | Status: AC

## 2014-11-08 MED FILL — Magnesium Sulfate Inj 50%: INTRAMUSCULAR | Qty: 10 | Status: AC

## 2014-11-08 MED FILL — Heparin Sodium (Porcine) Inj 1000 Unit/ML: INTRAMUSCULAR | Qty: 30 | Status: AC

## 2014-11-08 MED FILL — Sodium Chloride IV Soln 0.9%: INTRAVENOUS | Qty: 2000 | Status: AC

## 2014-11-08 MED FILL — Mannitol IV Soln 20%: INTRAVENOUS | Qty: 500 | Status: AC

## 2014-11-08 MED FILL — Sodium Bicarbonate IV Soln 8.4%: INTRAVENOUS | Qty: 50 | Status: AC

## 2014-11-08 MED FILL — Potassium Chloride Inj 2 mEq/ML: INTRAVENOUS | Qty: 40 | Status: AC

## 2014-11-08 SURGICAL SUPPLY — 85 items
ADAPTER CARDIO PERF ANTE/RETRO (ADAPTER) ×3 IMPLANT
BAG DECANTER FOR FLEXI CONT (MISCELLANEOUS) ×3 IMPLANT
BLADE STERNUM SYSTEM 6 (BLADE) ×3 IMPLANT
BLADE SURG 15 STRL LF DISP TIS (BLADE) ×2 IMPLANT
BLADE SURG 15 STRL SS (BLADE) ×1
CANISTER SUCTION 2500CC (MISCELLANEOUS) ×3 IMPLANT
CANNULA GUNDRY RCSP 15FR (MISCELLANEOUS) ×3 IMPLANT
CATH ROBINSON RED A/P 18FR (CATHETERS) ×12 IMPLANT
CATH THORACIC 36FR (CATHETERS) ×3 IMPLANT
CATH THORACIC 36FR RT ANG (CATHETERS) ×3 IMPLANT
CONT SPEC 4OZ CLIKSEAL STRL BL (MISCELLANEOUS) ×3 IMPLANT
CONT SPEC STER OR (MISCELLANEOUS) ×3 IMPLANT
COVER SURGICAL LIGHT HANDLE (MISCELLANEOUS) ×3 IMPLANT
CRADLE DONUT ADULT HEAD (MISCELLANEOUS) ×3 IMPLANT
DRAPE SLUSH/WARMER DISC (DRAPES) ×3 IMPLANT
DRSG COVADERM 4X10 (GAUZE/BANDAGES/DRESSINGS) ×3 IMPLANT
DRSG COVADERM 4X14 (GAUZE/BANDAGES/DRESSINGS) ×3 IMPLANT
ELECT CAUTERY BLADE 6.4 (BLADE) ×3 IMPLANT
ELECT REM PT RETURN 9FT ADLT (ELECTROSURGICAL) ×6
ELECTRODE REM PT RTRN 9FT ADLT (ELECTROSURGICAL) ×4 IMPLANT
GAUZE SPONGE 4X4 12PLY STRL (GAUZE/BANDAGES/DRESSINGS) ×3 IMPLANT
GLOVE BIO SURGEON STRL SZ 6 (GLOVE) IMPLANT
GLOVE BIO SURGEON STRL SZ 6.5 (GLOVE) ×9 IMPLANT
GLOVE BIO SURGEON STRL SZ7 (GLOVE) IMPLANT
GLOVE BIO SURGEON STRL SZ7.5 (GLOVE) IMPLANT
GLOVE BIOGEL PI IND STRL 6.5 (GLOVE) ×2 IMPLANT
GLOVE BIOGEL PI INDICATOR 6.5 (GLOVE) ×1
GLOVE EUDERMIC 7 POWDERFREE (GLOVE) ×6 IMPLANT
GOWN STRL REUS W/ TWL LRG LVL3 (GOWN DISPOSABLE) ×8 IMPLANT
GOWN STRL REUS W/ TWL XL LVL3 (GOWN DISPOSABLE) ×2 IMPLANT
GOWN STRL REUS W/TWL LRG LVL3 (GOWN DISPOSABLE) ×4
GOWN STRL REUS W/TWL XL LVL3 (GOWN DISPOSABLE) ×1
HEART VENT LT CURVED (MISCELLANEOUS) ×3 IMPLANT
HEMOSTAT POWDER SURGIFOAM 1G (HEMOSTASIS) ×9 IMPLANT
HEMOSTAT SURGICEL 2X14 (HEMOSTASIS) ×3 IMPLANT
KIT BASIN OR (CUSTOM PROCEDURE TRAY) ×3 IMPLANT
KIT CATH CPB BARTLE (MISCELLANEOUS) ×3 IMPLANT
KIT ROOM TURNOVER OR (KITS) ×3 IMPLANT
KIT SUCTION CATH 14FR (SUCTIONS) ×3 IMPLANT
LINE VENT (MISCELLANEOUS) ×3 IMPLANT
NEEDLE 18GX1X1/2 (RX/OR ONLY) (NEEDLE) ×3 IMPLANT
NS IRRIG 1000ML POUR BTL (IV SOLUTION) ×18 IMPLANT
PACK OPEN HEART (CUSTOM PROCEDURE TRAY) ×3 IMPLANT
PAD ARMBOARD 7.5X6 YLW CONV (MISCELLANEOUS) ×6 IMPLANT
SET CARDIOPLEGIA MPS 5001102 (MISCELLANEOUS) ×3 IMPLANT
SPONGE GAUZE 4X4 12PLY STER LF (GAUZE/BANDAGES/DRESSINGS) ×3 IMPLANT
SPONGE LAP 18X18 X RAY DECT (DISPOSABLE) ×3 IMPLANT
SUT BONE WAX W31G (SUTURE) ×3 IMPLANT
SUT ETHIBON 2 0 V 52N 30 (SUTURE) ×6 IMPLANT
SUT ETHIBOND 2 0 SH (SUTURE) ×5
SUT ETHIBOND 2 0 SH 36X2 (SUTURE) ×10 IMPLANT
SUT ETHIBOND V-5 VALVE (SUTURE) ×3 IMPLANT
SUT PROLENE 3 0 SH DA (SUTURE) IMPLANT
SUT PROLENE 3 0 SH1 36 (SUTURE) ×3 IMPLANT
SUT PROLENE 4 0 RB 1 (SUTURE) ×5
SUT PROLENE 4-0 RB1 .5 CRCL 36 (SUTURE) ×10 IMPLANT
SUT PROLENE 5 0 C 1 36 (SUTURE) ×6 IMPLANT
SUT PROLENE 6 0 C 1 30 (SUTURE) ×6 IMPLANT
SUT PROLENE 8 0 BV175 6 (SUTURE) ×6 IMPLANT
SUT SILK  1 MH (SUTURE) ×3
SUT SILK 1 MH (SUTURE) ×6 IMPLANT
SUT SILK 1 TIES 10X30 (SUTURE) ×12 IMPLANT
SUT SILK 2 0 SH CR/8 (SUTURE) ×6 IMPLANT
SUT SILK 2 0 TIES 10X30 (SUTURE) ×3 IMPLANT
SUT SILK 2 0 TIES 17X18 (SUTURE) ×1
SUT SILK 2-0 18XBRD TIE BLK (SUTURE) ×2 IMPLANT
SUT SILK 3 0 SH CR/8 (SUTURE) ×3 IMPLANT
SUT SILK 4 0 TIE 10X30 (SUTURE) ×6 IMPLANT
SUT STEEL 6MS V (SUTURE) ×6 IMPLANT
SUT TEM PAC WIRE 2 0 SH (SUTURE) ×3 IMPLANT
SUT VIC AB 1 CTX 36 (SUTURE) ×2
SUT VIC AB 1 CTX36XBRD ANBCTR (SUTURE) ×4 IMPLANT
SUT VICRYL 2 0 J607H (SUTURE) ×6 IMPLANT
SUT VICRYL 3 0 (SUTURE) ×6 IMPLANT
SUTURE E-PAK OPEN HEART (SUTURE) ×3 IMPLANT
SYRINGE 10CC LL (SYRINGE) ×3 IMPLANT
SYSTEM SAHARA CHEST DRAIN ATS (WOUND CARE) ×3 IMPLANT
TAPE CLOTH SURG 4X10 WHT LF (GAUZE/BANDAGES/DRESSINGS) ×3 IMPLANT
TAPE PAPER 2X10 WHT MICROPORE (GAUZE/BANDAGES/DRESSINGS) ×3 IMPLANT
TOWEL OR 17X24 6PK STRL BLUE (TOWEL DISPOSABLE) ×6 IMPLANT
TOWEL OR 17X26 10 PK STRL BLUE (TOWEL DISPOSABLE) ×6 IMPLANT
TRAY FOLEY IC TEMP SENS 16FR (CATHETERS) ×3 IMPLANT
UNDERPAD 30X30 INCONTINENT (UNDERPADS AND DIAPERS) ×3 IMPLANT
VALVE MAGNA EASE AORTIC 23MM (Prosthesis & Implant Heart) ×3 IMPLANT
WATER STERILE IRR 1000ML POUR (IV SOLUTION) ×6 IMPLANT

## 2014-11-08 NOTE — OR Nursing (Signed)
SICU Call : 1st call @ 1054; 2nd call @ 1116; 3rd call @ 1134; and final call @ 1201noon going to SICU Room #5.    Marykay Lex Aryan Bello,RN

## 2014-11-08 NOTE — Procedures (Signed)
Extubation Procedure Note  Patient Details:   Name: Daniel DOWN Sr. DOB: 04-22-1940 MRN: [id number]   Pt extubated to 4L per protocol. VS WNL, pt able to vocalize, no stridor noted. Pt tolerating well at this time. RT will continue to monitor.    Evaluation  O2 sats: stable throughout Complications: No apparent complications Patient did tolerate procedure well. Bilateral Breath Sounds: Diminished   Yes  [name] 11/08/2014, 5:09 PM

## 2014-11-08 NOTE — Progress Notes (Signed)
  Echocardiogram 2D Echocardiogram has been performed.  Jennette Dubin 11/08/2014, 9:12 AM

## 2014-11-08 NOTE — Op Note (Signed)
CARDIOVASCULAR SURGERY OPERATIVE NOTE  11/08/2014 YEHIA MCBAIN Sr. [id number]  Surgeon:  Gaye Pollack, MD  First Assistant: Jadene Pierini,  PA-C   Preoperative Diagnosis:  Severe aortic stenosis   Postoperative Diagnosis:  Same   Procedure:  1. Median Sternotomy 2. Extracorporeal circulation 3.   Aortic valve replacement using a 23 mm Edwards Magna-Ease pericardial valve.  Anesthesia:  General Endotracheal   Clinical History/Surgical Indication:  The patient is a 74 year old gentleman with a long history of a heart murmur who felt fine until 6 months ago when he began developing exertional shortness of breath and fatigue with some chest discomfort. His symptoms resolve with rest. His wife feels like he has not been able to do the things he used to be able to do like working in the yard and taking walks. An echo on 09/18/2014 reportedly showed a mean aortic valve gradient of 37 mm Hg and a peak of 64 mm Hg with a calcified and thickened aortic valve. LV function was normal. He was referred to Dr. Ubaldo Glassing and then to Dr. Burt Knack for consideration of TAVR.   He has stage D severe symptomatic aortic stenosis with progressive exertional shortness of breath and fatigue, NYHA class Ill. He has no significant coronary disease and normal LV function. I agree that AVR is indicated in this patient for relief of his symptoms and to prevent LV deterioration. I have reviewed the natural history of aortic stenosis with the patient and his wife who is present today. We have discussed the limitations of medical therapy and the poor prognosis associated with symptomatic aortic stenosis. We have also reviewed potential treatment options, including palliative medical therapy, conventional surgical aortic valve replacement, and transcatheter aortic valve replacement. His STS PROM is only about 2% and probably less than that so he is in the low risk group that are not candidates for TAVR at this time. I discussed  the reasons for this with he and his wife and they understand. I think he will do well with open surgical AVR. I discussed the operative procedure with the patient and his wife including alternatives, benefits and risks; including but not limited to bleeding, blood transfusion, infection, stroke, myocardial infarction, graft failure, heart block requiring a permanent pacemaker, organ dysfunction, and death. I would plan to use a tissue valve at his age. Jory Sims Sr. understands and agrees to proceed.    Preparation:  The patient was seen in the preoperative holding area and the correct patient, correct operation were confirmed with the patient after reviewing the medical record and catheterization. The consent was signed by me. Preoperative antibiotics were given. A pulmonary arterial line and radial arterial line were placed by the anesthesia team. The patient was taken back to the operating room and positioned supine on the operating room table. After being placed under general endotracheal anesthesia by the anesthesia team a foley catheter was placed. The neck, chest, abdomen, and both legs were prepped with betadine soap and solution and draped in the usual sterile manner. A surgical time-out was taken and the correct patient and operative procedure were confirmed with the nursing and anesthesia staff.   Pre-bypass TEE:   Complete TEE assessment was performed by Dr. Suzette Battiest. 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 The leaflets were calcified with restricted mobility. LV function was normal. There was no MR.    Post-bypass TEE:   Normal functioning prosthetic aortic valve with no perivalvular leak  or regurgitation through the valve. Left ventricular function preserved. No mitral regurgitation.    Cardiopulmonary Bypass:  A median sternotomy was performed. The pericardium was opened in the midline. Right ventricular function appeared normal.  The ascending aorta was of normal size and had no palpable plaque. There were no contraindications to aortic cannulation or cross-clamping. The patient was fully systemically heparinized and the ACT was maintained > 400 sec. The proximal aortic arch was cannulated with a 20 F aortic cannula for arterial inflow. Venous cannulation was performed via the right atrial appendage using a two-staged venous cannula. An antegrade cardioplegia/vent cannula was inserted into the mid-ascending aorta. A left ventricular vent was placed via the right superior pulmonary vein. A retrograde cardioplegia cannnula was placed into the coronary sinus via the right atrium. Aortic occlusion was performed with a single cross-clamp. Systemic cooling to 32 degrees Centigrade and topical cooling of the heart with iced saline were used. Hyperkalemic antegrade cold blood cardioplegia was used to induce diastolic arrest and then cold blood retrograde cardioplegia was given at about 20 minute intervals throughout the period of arrest to maintain myocardial temperature at or below 10 degrees centigrade. A temperature probe was inserted into the interventricular septum and an insulating pad was placed in the pericardium. Carbon dioxide was insufflated into the pericardium at 5L/min throughout the procedure to minimize intracardiac air.   Aortic Valve Replacement:   A transverse aortotomy was performed 1 cm above the take-off of the right coronary artery. The native valve was functionally bicuspid with complete fusion of the left and right cusps with three sinuses, with moderately calcified leaflets and mild annular calcification. The ostia of the coronary arteries were in normal position and were not obstructed. The native valve leaflets were excised and the annulus was decalcified with rongeurs. Care was taken to remove all particulate debris. The left ventricle was directly inspected for debris and then irrigated with ice saline solution.  The annulus was sized and a size 23 mm  Edwards  Magna-Ease pericardial  valve was chosen. The model number was [id number] and the serial number was [id number]. While the valve was being prepared 2-0 Ethibond pledgeted horizontal mattress sutures were placed around the annulus with the pledgets in a sub-annular position. The sutures were placed through the sewing ring and the valve lowered into place. The sutures were tied sequentially. The valve seated nicely and the coronary ostia were not obstructed. The prosthetic valve leaflets moved normally and there was no sub-valvular obstruction. The aortotomy was closed using 4-0 Prolene suture in 2 layers with felt strips to reinforce the closure.  Completion:  The patient was rewarmed to 37 degrees Centigrade. De-airing maneuvers were performed and the head placed in trendelenburg position. The crossclamp was removed with a time of 74 minutes. There was spontaneous return of sinus rhythm. The aortotomy was checked for hemostasis. Two temporary epicardial pacing wires were placed on the right atrium and two on the right ventricle. The left ventricular vent and retrograde cardioplegia cannulas were removed. The patient was weaned from CPB without difficulty on no inotropes. CPB time was 94 minutes. Cardiac output was 5 LPM. Heparin was fully reversed with protamine and the aortic and venous cannulas removed. Hemostasis was achieved. Mediastinal drainage tubes were placed. The sternum was closed with  #6 stainless steel wires. The fascia was closed with continuous # 1 vicryl suture. The subcutaneous tissue was closed with 2-0 vicryl continuous suture. The skin was closed with 3-0 vicryl subcuticular suture. All  sponge, needle, and instrument counts were reported correct at the end of the case. Dry sterile dressings were placed over the incisions and around the chest tubes which were connected to pleurevac suction. The patient was then transported to the surgical intensive  care unit in critical but stable condition.

## 2014-11-08 NOTE — Anesthesia Preprocedure Evaluation (Signed)
Anesthesia Evaluation  Patient identified by MRN, date of birth, ID band Patient awake    Reviewed: Allergy & Precautions, NPO status , Patient's Chart, lab work & pertinent test results  Airway Mallampati: II  TM Distance: >3 FB Neck ROM: Full    Dental  (+) Teeth Intact, Dental Advisory Given   Pulmonary asthma , Current Smoker,    breath sounds clear to auscultation       Cardiovascular + Valvular Problems/Murmurs AS  Rhythm:Regular Rate:Normal + Systolic murmurs    Neuro/Psych negative neurological ROS     GI/Hepatic Neg liver ROS, GERD  ,  Endo/Other  negative endocrine ROS  Renal/GU negative Renal ROS     Musculoskeletal   Abdominal   Peds  Hematology negative hematology ROS (+)   Anesthesia Other Findings   Reproductive/Obstetrics                             Anesthesia Physical Anesthesia Plan  ASA: IV  Anesthesia Plan: General   Post-op Pain Management:    Induction: Intravenous  Airway Management Planned: Oral ETT  Additional Equipment: Arterial line, CVP, PA Cath, Ultrasound Guidance Line Placement and TEE  Intra-op Plan:   Post-operative Plan: Post-operative intubation/ventilation  Informed Consent: I have reviewed the patients History and Physical, chart, labs and discussed the procedure including the risks, benefits and alternatives for the proposed anesthesia with the patient or authorized representative who has indicated his/her understanding and acceptance.   Dental advisory given  Plan Discussed with: CRNA  Anesthesia Plan Comments:         Anesthesia Quick Evaluation

## 2014-11-08 NOTE — Interval H&P Note (Signed)
History and Physical Interval Note:  11/08/2014 6:52 AM  Daniel Sims Sr.  has presented today for surgery, with the diagnosis of AS  The various methods of treatment have been discussed with the patient and family. After consideration of risks, benefits and other options for treatment, the patient has consented to  Procedure(s): AORTIC VALVE REPLACEMENT (AVR) (N/A) TRANSESOPHAGEAL ECHOCARDIOGRAM (TEE) (N/A) as a surgical intervention .  The patient's history has been reviewed, patient examined, no change in status, stable for surgery.  I have reviewed the patient's chart and labs.  Questions were answered to the patient's satisfaction.     Gaye Pollack

## 2014-11-08 NOTE — Anesthesia Procedure Notes (Addendum)
Anesthesia Procedure Note Central line insertion note. Skin prepped and draped in sterile fashion. Patent vessel identified on u/s using linear probe. Needle advanced under live u/s guidance with aspiration of blood upon entry into vessel. Catheter passed easily over finder needle. Wire passed easily through catheter and location confirmed with u/s. Image saved in chart. Introducer catheter advanced over wire, with aspiration of blood through all ports for confirmation. Line sutured and dressing applied. Pt tolerated well with no immediate complications.  Deatra Canter, MD Procedure Name: Intubation Date/Time: 11/08/2014 8:12 AM Performed by: Jacquiline Doe A Pre-anesthesia Checklist: Patient identified, Timeout performed, Emergency Drugs available, Suction available and Patient being monitored Patient Re-evaluated:Patient Re-evaluated prior to inductionOxygen Delivery Method: Circle system utilized Preoxygenation: Pre-oxygenation with 100% oxygen Intubation Type: IV induction and Cricoid Pressure applied Ventilation: Mask ventilation without difficulty and Oral airway inserted - appropriate to patient size Laryngoscope Size: Mac and 4 Grade View: Grade I Tube type: Oral Tube size: 8.0 mm Number of attempts: 1 Airway Equipment and Method: Stylet Placement Confirmation: ETT inserted through vocal cords under direct vision,  breath sounds checked- equal and bilateral and positive ETCO2 Secured at: 23 cm Tube secured with: Tape Dental Injury: Teeth and Oropharynx as per pre-operative assessment

## 2014-11-08 NOTE — Progress Notes (Signed)
Patient ID: Daniel Sims Sr., male   DOB: 10/04/1940, 74 y.o.   MRN: [id number]  SICU Evening Rounds:   Hemodynamically stable  CI = 2.2  Extubated and awake  Urine output good  CT output low  CBC    Component Value Date/Time   WBC 18.2* 11/08/2014 1230   RBC 3.75* 11/08/2014 1230   HGB 12.6* 11/08/2014 1237   HCT 37.0* 11/08/2014 1237   PLT 172 11/08/2014 1230   MCV 98.9 11/08/2014 1230   MCH 31.7 11/08/2014 1230   MCHC 32.1 11/08/2014 1230   RDW 13.0 11/08/2014 1230     BMET    Component Value Date/Time   NA 141 11/08/2014 1237   K 4.5 11/08/2014 1237   CL 104 11/08/2014 1118   CO2 22 11/06/2014 1034   GLUCOSE 102* 11/08/2014 1237   BUN 17 11/08/2014 1118   CREATININE 1.00 11/08/2014 1118   CALCIUM 9.4 11/06/2014 1034   GFRNONAA >60 11/06/2014 1034   GFRAA >60 11/06/2014 1034     A/P:  Stable postop course. Continue current plans

## 2014-11-08 NOTE — Anesthesia Postprocedure Evaluation (Signed)
  Anesthesia Post-op Note  Patient: Daniel Sims Sr.  Procedure(s) Performed: Procedure(s): AORTIC VALVE REPLACEMENT (AVR) WITH 23MM MAGNA EASE AORTIC  PORCINE/TISSUE HEART VALVE (N/A) TRANSESOPHAGEAL ECHOCARDIOGRAM (TEE) (N/A)  Patient Location: ICU  Anesthesia Type:General  Level of Consciousness: Patient remains intubated per anesthesia plan  Airway and Oxygen Therapy: Patient remains intubated per anesthesia plan  Post-op Pain: none  Post-op Assessment: Post-op Vital signs reviewed              Post-op Vital Signs: Reviewed  Last Vitals:  Filed Vitals:   11/08/14 1222  BP: 87/48  Pulse: 91  Temp:   Resp: 12    Complications: No apparent anesthesia complications

## 2014-11-08 NOTE — Transfer of Care (Signed)
Immediate Anesthesia Transfer of Care Note  Patient: SHERI PROWS Sr.  Procedure(s) Performed: Procedure(s): AORTIC VALVE REPLACEMENT (AVR) WITH 23MM MAGNA EASE AORTIC  PORCINE/TISSUE HEART VALVE (N/A) TRANSESOPHAGEAL ECHOCARDIOGRAM (TEE) (N/A)  Patient Location: SICU  Anesthesia Type:General  Level of Consciousness: sedated and Patient remains intubated per anesthesia plan  Airway & Oxygen Therapy: Patient remains intubated per anesthesia plan and Patient placed on Ventilator (see vital sign flow sheet for setting)  Post-op Assessment: Report given to RN and Post -op Vital signs reviewed and stable  Post vital signs: Reviewed and stable  Last Vitals:  Filed Vitals:   11/08/14 1222  BP: 87/48  Pulse: 91  Temp:   Resp: 12    Complications: No apparent anesthesia complications

## 2014-11-08 NOTE — Brief Op Note (Signed)
      [address]       [place] [address]             [phone]     11/08/2014 11/08/2014  10:56 AM  PATIENT:  Daniel Kennedy.  74 y.o. male  PRE-OPERATIVE DIAGNOSIS:  AS  POST-OPERATIVE DIAGNOSIS:  AS  PROCEDURE:  Procedure(s): AORTIC VALVE REPLACEMENT (AVR) WITH 23MM MAGNA EASE BIOPROSTHETIC TRANSESOPHAGEAL ECHOCARDIOGRAM (TEE)  SURGEON:  Surgeon(s): Gaye Pollack, MD  PHYSICIAN ASSISTANT: Kippy Gohman PA-C  ANESTHESIA:   general  PATIENT CONDITION:  ICU - intubated and hemodynamically stable.  PRE-OPERATIVE WEIGHT: 74kg  Aortic Valve  Procedure Performed:  Replacement: Yes.  Bioprosthetic Valve. Implant Model Number:[id number], Size:23, Unique Device Identifier:[id number]  Repair/Reconstruction: No.   Aortic Annular Enlargement: No.   Aortic Valve Etiology   Aortic Insufficiency:  Trivial/Trace  Aortic Valve Disease:  Yes.  Aortic Stenosis:  Yes. Smallest Aortic Valve Area: 1.3cm2; Highest Mean Gradient: 34mmHg.  Etiology (Choose at least one and up to  5 etiologies):  Degenerative - Calcified

## 2014-11-09 ENCOUNTER — Inpatient Hospital Stay (HOSPITAL_COMMUNITY): Payer: Commercial Managed Care - HMO

## 2014-11-09 ENCOUNTER — Encounter (HOSPITAL_COMMUNITY): Payer: Self-pay | Admitting: Surgery

## 2014-11-09 LAB — BASIC METABOLIC PANEL
ANION GAP: 6 (ref 5–15)
BUN: 18 mg/dL (ref 6–20)
CALCIUM: 7.9 mg/dL — AB (ref 8.9–10.3)
CO2: 23 mmol/L (ref 22–32)
Chloride: 107 mmol/L (ref 101–111)
Creatinine, Ser: 1.32 mg/dL — ABNORMAL HIGH (ref 0.61–1.24)
GFR, EST AFRICAN AMERICAN: 60 mL/min — AB (ref >60)
GFR, EST NON AFRICAN AMERICAN: 51 mL/min — AB (ref >60)
Glucose, Bld: 83 mg/dL (ref 65–99)
POTASSIUM: 4.8 mmol/L (ref 3.5–5.1)
Sodium: 136 mmol/L (ref 135–145)

## 2014-11-09 LAB — CBC
HCT: 38.4 % — ABNORMAL LOW (ref 39.0–52.0)
Hemoglobin: 12.3 g/dL — ABNORMAL LOW (ref 13.0–17.0)
MCH: 32 pg (ref 26.0–34.0)
MCHC: 32 g/dL (ref 30.0–36.0)
MCV: 100 fL (ref 78.0–100.0)
PLATELETS: 197 10*3/uL (ref 150–400)
RBC: 3.84 MIL/uL — AB (ref 4.22–5.81)
RDW: 13.2 % (ref 11.5–15.5)
WBC: 18.2 10*3/uL — AB (ref 4.0–10.5)

## 2014-11-09 LAB — GLUCOSE, CAPILLARY
GLUCOSE-CAPILLARY: 159 mg/dL — AB (ref 65–99)
GLUCOSE-CAPILLARY: 142 mg/dL — AB (ref 65–99)
GLUCOSE-CAPILLARY: 128 mg/dL — AB (ref 65–99)
GLUCOSE-CAPILLARY: 113 mg/dL — AB (ref 65–99)
GLUCOSE-CAPILLARY: 136 mg/dL — AB (ref 65–99)
Glucose-Capillary: 111 mg/dL — ABNORMAL HIGH (ref 65–99)
Glucose-Capillary: 156 mg/dL — ABNORMAL HIGH (ref 65–99)
Glucose-Capillary: 88 mg/dL (ref 65–99)
Glucose-Capillary: 94 mg/dL (ref 65–99)
Glucose-Capillary: 98 mg/dL (ref 65–99)

## 2014-11-09 LAB — MAGNESIUM: MAGNESIUM: 2.3 mg/dL (ref 1.7–2.4)

## 2014-11-09 MED ORDER — TRAMADOL HCL 50 MG PO TABS: 50.0000 mg | ORAL_TABLET | ORAL | Status: DC | PRN

## 2014-11-09 MED ORDER — ONDANSETRON HCL 4 MG PO TABS: 4.0000 mg | ORAL_TABLET | Freq: Four times a day (QID) | ORAL | Status: DC | PRN

## 2014-11-09 MED ORDER — ONDANSETRON HCL 4 MG/2ML IJ SOLN
4.0000 mg | Freq: Four times a day (QID) | INTRAMUSCULAR | Status: DC | PRN
  Administered 2014-11-09 – 2014-11-10 (×2): 4 mg via INTRAVENOUS
  Filled 2014-11-09 (×2): qty 2

## 2014-11-09 MED ORDER — METOPROLOL TARTRATE 12.5 MG HALF TABLET
12.5000 mg | ORAL_TABLET | Freq: Two times a day (BID) | ORAL | Status: DC
  Administered 2014-11-09 – 2014-11-12 (×7): 12.5 mg via ORAL
  Filled 2014-11-09 (×8): qty 1

## 2014-11-09 MED ORDER — MOVING RIGHT ALONG BOOK
Freq: Once | Status: AC
  Administered 2014-11-09: 11:00:00
  Filled 2014-11-09: qty 1

## 2014-11-09 MED ORDER — FAMOTIDINE 20 MG PO TABS
20.0000 mg | ORAL_TABLET | Freq: Two times a day (BID) | ORAL | Status: DC
  Administered 2014-11-09 – 2014-11-12 (×6): 20 mg via ORAL
  Filled 2014-11-09 (×6): qty 1

## 2014-11-09 MED ORDER — ASPIRIN 81 MG PO CHEW
81.0000 mg | CHEWABLE_TABLET | Freq: Every day | ORAL | Status: DC
  Filled 2014-11-09: qty 1

## 2014-11-09 MED ORDER — ACETAMINOPHEN 325 MG PO TABS
650.0000 mg | ORAL_TABLET | Freq: Four times a day (QID) | ORAL | Status: DC | PRN
  Administered 2014-11-09 – 2014-11-12 (×7): 650 mg via ORAL
  Filled 2014-11-09 (×7): qty 2

## 2014-11-09 MED ORDER — INSULIN ASPART 100 UNIT/ML ~~LOC~~ SOLN: 0.0000 [IU] | SUBCUTANEOUS | Status: DC

## 2014-11-09 MED ORDER — ASPIRIN EC 81 MG PO TBEC
81.0000 mg | DELAYED_RELEASE_TABLET | Freq: Every day | ORAL | Status: DC
  Administered 2014-11-09: 81 mg via ORAL
  Filled 2014-11-09: qty 1

## 2014-11-09 MED ORDER — BISACODYL 10 MG RE SUPP: 10.0000 mg | Freq: Every day | RECTAL | Status: DC | PRN

## 2014-11-09 MED ORDER — OXYCODONE HCL 5 MG PO TABS
5.0000 mg | ORAL_TABLET | ORAL | Status: DC | PRN
  Administered 2014-11-09 – 2014-11-10 (×2): 10 mg via ORAL
  Filled 2014-11-09 (×3): qty 2

## 2014-11-09 MED ORDER — FUROSEMIDE 40 MG PO TABS
40.0000 mg | ORAL_TABLET | Freq: Every day | ORAL | Status: AC
  Administered 2014-11-10: 40 mg via ORAL
  Filled 2014-11-09 (×2): qty 1

## 2014-11-09 MED ORDER — SODIUM CHLORIDE 0.9 % IV SOLN: 250.0000 mL | INTRAVENOUS | Status: DC | PRN

## 2014-11-09 MED ORDER — ASPIRIN EC 81 MG PO TBEC
81.0000 mg | DELAYED_RELEASE_TABLET | Freq: Every day | ORAL | Status: DC
  Administered 2014-11-10 – 2014-11-12 (×3): 81 mg via ORAL
  Filled 2014-11-09 (×3): qty 1

## 2014-11-09 MED ORDER — SODIUM CHLORIDE 0.9 % IJ SOLN: 3.0000 mL | INTRAMUSCULAR | Status: DC | PRN

## 2014-11-09 MED ORDER — BISACODYL 5 MG PO TBEC: 10.0000 mg | DELAYED_RELEASE_TABLET | Freq: Every day | ORAL | Status: DC | PRN

## 2014-11-09 MED ORDER — SODIUM CHLORIDE 0.9 % IJ SOLN
3.0000 mL | Freq: Two times a day (BID) | INTRAMUSCULAR | Status: DC
  Administered 2014-11-09 – 2014-11-11 (×4): 3 mL via INTRAVENOUS

## 2014-11-09 MED ORDER — DOCUSATE SODIUM 100 MG PO CAPS
200.0000 mg | ORAL_CAPSULE | Freq: Every day | ORAL | Status: DC
  Filled 2014-11-09 (×3): qty 2

## 2014-11-09 NOTE — Progress Notes (Signed)
Utilization Review Completed.  

## 2014-11-09 NOTE — Significant Event (Signed)
Patient transferred safely to new room, settled in bed per his requests. VS stable. Family at bedside. No personal belongings at bedside of 2S05 that could be taken to new room. Report given to receiving RN Rodman Pickle. Ennis Delpozo, Therapist, sports.

## 2014-11-09 NOTE — Progress Notes (Signed)
1 Day Post-Op Procedure(s) (LRB): AORTIC VALVE REPLACEMENT (AVR) WITH 23MM MAGNA EASE AORTIC  PORCINE/TISSUE HEART VALVE (N/A) TRANSESOPHAGEAL ECHOCARDIOGRAM (TEE) (N/A) Subjective:  Sore but otherwise ok   Objective: Vital signs in last 24 hours: Temp:  [97 F (36.1 C)-99.7 F (37.6 C)] 98.8 F (37.1 C) (10/21 0600) Pulse Rate:  [66-98] 96 (10/21 0700) Cardiac Rhythm:  [-] Normal sinus rhythm (10/20 2000) Resp:  [8-31] 24 (10/21 0700) BP: (87-130)/(48-82) 130/82 mmHg (10/21 0700) SpO2:  [92 %-100 %] 92 % (10/21 0700) Arterial Line BP: (71-145)/(46-76) 125/70 mmHg (10/21 0700) FiO2 (%):  [40 %-50 %] 40 % (10/20 1636) Weight:  [74.8 kg (164 lb 14.5 oz)] 74.8 kg (164 lb 14.5 oz) (10/21 0700)  Hemodynamic parameters for last 24 hours: PAP: (20-38)/(9-25) 37/25 mmHg CO:  [3.5 L/min-6.2 L/min] 6 L/min CI:  [1.8 L/min/m2-3.1 L/min/m2] 3 L/min/m2  Intake/Output from previous day: 10/20 0701 - 10/21 0700 In: 4481.5 [P.O.:60; I.V.:3112.5; Blood:409; NG/GT:30; IV Piggyback:870] Out: 5638 [Urine:3200; Emesis/NG output:100; Blood:2319; Chest Tube:360] Intake/Output this shift:    General appearance: alert and cooperative Neurologic: intact Heart: regular rate and rhythm, S1, S2 normal, no murmur, click, rub or gallop Lungs: clear to auscultation bilaterally Extremities: extremities normal, atraumatic, no cyanosis or edema Wound: dressing dry  Lab Results:  Recent Labs  11/08/14 1800 11/08/14 1805 11/09/14 0315  WBC 20.6*  --  18.2*  HGB 13.4 15.0 12.3*  HCT 41.6 44.0 38.4*  PLT 202  --  197   BMET:  Recent Labs  11/06/14 1034  11/08/14 1805 11/09/14 0315  NA 139  < > 140 136  K 5.0  < > 5.1 4.8  CL 108  < > 107 107  CO2 22  --   --  23  GLUCOSE 87  < > 139* 83  BUN 17  < > 18 18  CREATININE 1.15  < > 1.10 1.32*  CALCIUM 9.4  --   --  7.9*  < > = values in this interval not displayed.  PT/INR:  Recent Labs  11/08/14 1230  LABPROT 18.1*  INR 1.49   ABG   Component Value Date/Time   PHART 7.315* 11/08/2014 1802   HCO3 20.5 11/08/2014 1802   TCO2 19 11/08/2014 1805   ACIDBASEDEF 6.0* 11/08/2014 1802   O2SAT 98.0 11/08/2014 1802   CBG (last 3)   Recent Labs  11/08/14 2356 11/09/14 0101 11/09/14 0347  GLUCAP 94 88 98   CXR: ok  ECG: sinus, mild diffuse ST elevation probably early repol. Assessment/Plan: S/P Procedure(s) (LRB): AORTIC VALVE REPLACEMENT (AVR) WITH 23MM MAGNA EASE AORTIC  PORCINE/TISSUE HEART VALVE (N/A) TRANSESOPHAGEAL ECHOCARDIOGRAM (TEE) (N/A)  He is hemodynamically stable on low dose neo. Wean off since BP is fine this am. Will hold lopressor this am.    DC chest tubes, swan, arterial line  Mobilize.  Transfer to 2W.  Will keep on ASA 81 mg since he was not on ASA preop and was oozy.    LOS: 1 day    Gaye Pollack 11/09/2014

## 2014-11-09 NOTE — Progress Notes (Signed)
CARDIAC REHAB PHASE I   PRE:  Rate/Rhythm: 86 SR  BP:  Sitting: 117/65        SaO2: 98 2L  MODE:  Ambulation: 200 ft   POST:  Rate/Rhythm: 72 SR  BP:  Sitting: 136/59         SaO2: 98 2L  Pt reluctant to ambulate, states he has been nauseated. Pt agreeable to try. Pt ambulated 200 ft on 2L O2, hand held assist, steady gait, tolerated well. Pt did c/o some nausea and dizziness, incisional soreness, mild DOE, declined rest stop. Pt very independent, does not like assistance, however did exercise good sternal precautions. Pt to bed per pt request after walk, call bell within reach.  Encouraged additional ambulation as tolerated. Will follow.  [phone]   Lenna Sciara, RN, BSN 11/09/2014 2:14 PM

## 2014-11-10 ENCOUNTER — Inpatient Hospital Stay (HOSPITAL_COMMUNITY): Payer: Commercial Managed Care - HMO

## 2014-11-10 LAB — BASIC METABOLIC PANEL
ANION GAP: 4 — AB (ref 5–15)
BUN: 21 mg/dL — ABNORMAL HIGH (ref 6–20)
CHLORIDE: 104 mmol/L (ref 101–111)
CO2: 25 mmol/L (ref 22–32)
Calcium: 8.2 mg/dL — ABNORMAL LOW (ref 8.9–10.3)
Creatinine, Ser: 1.55 mg/dL — ABNORMAL HIGH (ref 0.61–1.24)
GFR calc Af Amer: 49 mL/min — ABNORMAL LOW (ref >60)
GFR, EST NON AFRICAN AMERICAN: 42 mL/min — AB (ref >60)
GLUCOSE: 124 mg/dL — AB (ref 65–99)
POTASSIUM: 4.3 mmol/L (ref 3.5–5.1)
SODIUM: 133 mmol/L — AB (ref 135–145)

## 2014-11-10 LAB — CBC
HEMATOCRIT: 37 % — AB (ref 39.0–52.0)
Hemoglobin: 12.1 g/dL — ABNORMAL LOW (ref 13.0–17.0)
MCH: 32.5 pg (ref 26.0–34.0)
MCHC: 32.7 g/dL (ref 30.0–36.0)
MCV: 99.5 fL (ref 78.0–100.0)
Platelets: 155 10*3/uL (ref 150–400)
RBC: 3.72 MIL/uL — ABNORMAL LOW (ref 4.22–5.81)
RDW: 13.3 % (ref 11.5–15.5)
WBC: 14.3 10*3/uL — ABNORMAL HIGH (ref 4.0–10.5)

## 2014-11-10 LAB — GLUCOSE, CAPILLARY: Glucose-Capillary: 116 mg/dL — ABNORMAL HIGH (ref 65–99)

## 2014-11-10 NOTE — Progress Notes (Signed)
CARDIAC REHAB PHASE I   PRE:  Rate/Rhythm: 72 SR  BP:  Supine:   Sitting: 104/50  Standing:    SaO2: 91 RA at rest  MODE:  Ambulation: 340 ft   POST:  Rate/Rhythm: 108 ST  BP:  Supine:   Sitting: unable to take, patient had to go to BR quickly  Standing:    SaO2: 84% on RA , improved to 91 with purse lip breathing and rest. Encouraged and patient demonstrated IS.  Instructed to use every 1 hour while awake, and deep breathing.  Reported to nurse that patient needs oxygen when walking in the hall for now, is fine at rest.   [phone]  Liliane Channel RN, BSN 11/10/2014 9:40 AM

## 2014-11-10 NOTE — Progress Notes (Addendum)
[address]       [place] [address]             [phone]      2 Days Post-Op Procedure(s) (LRB): AORTIC VALVE REPLACEMENT (AVR) WITH 23MM MAGNA EASE AORTIC  PORCINE/TISSUE HEART VALVE (N/A) TRANSESOPHAGEAL ECHOCARDIOGRAM (TEE) (N/A) Subjective: Feels well  Objective: Vital signs in last 24 hours: Temp:  [98.2 F (36.8 C)-99.6 F (37.6 C)] 99.6 F (37.6 C) (10/22 0548) Pulse Rate:  [74-90] 90 (10/22 0548) Cardiac Rhythm:  [-] Normal sinus rhythm (10/22 0700) Resp:  [15-18] 18 (10/22 0548) BP: (104-132)/(54-69) 132/69 mmHg (10/22 0548) SpO2:  [90 %-97 %] 90 % (10/22 0548) Weight:  [165 lb 3.2 oz (74.934 kg)] 165 lb 3.2 oz (74.934 kg) (10/22 0548)  Hemodynamic parameters for last 24 hours:    Intake/Output from previous day: 10/21 0701 - 10/22 0700 In: 449.4 [P.O.:220; I.V.:129.4; IV Piggyback:100] Out: 300 [Urine:220; Chest Tube:80] Intake/Output this shift:    General appearance: alert, cooperative and no distress Heart: regular rate and rhythm Lungs: clear to auscultation bilaterally Abdomen: benign Extremities: minor edema Wound: dressings CDI  Lab Results:  Recent Labs  11/09/14 0315 11/10/14 0329  WBC 18.2* 14.3*  HGB 12.3* 12.1*  HCT 38.4* 37.0*  PLT 197 155   BMET:  Recent Labs  11/09/14 0315 11/10/14 0329  NA 136 133*  K 4.8 4.3  CL 107 104  CO2 23 25  GLUCOSE 83 124*  BUN 18 21*  CREATININE 1.32* 1.55*  CALCIUM 7.9* 8.2*    PT/INR:  Recent Labs  11/08/14 1230  LABPROT 18.1*  INR 1.49   ABG    Component Value Date/Time   PHART 7.315* 11/08/2014 1802   HCO3 20.5 11/08/2014 1802   TCO2 19 11/08/2014 1805   ACIDBASEDEF 6.0* 11/08/2014 1802   O2SAT 98.0 11/08/2014 1802   CBG (last 3)   Recent Labs  11/09/14 1641 11/09/14 2038 11/10/14 0616  GLUCAP 136* 156* 116*    Meds Scheduled Meds: . antiseptic oral rinse  7 mL Mouth Rinse QID  . aspirin EC  81 mg Oral Daily  . chlorhexidine gluconate   15 mL Mouth Rinse BID  . docusate sodium  200 mg Oral Daily  . famotidine  20 mg Oral BID  . furosemide  40 mg Oral Daily  . metoprolol tartrate  12.5 mg Oral BID  . sodium chloride  3 mL Intravenous Q12H  . tamsulosin  0.4 mg Oral Daily   Continuous Infusions:  PRN Meds:.sodium chloride, acetaminophen, bisacodyl **OR** bisacodyl, ondansetron **OR** ondansetron (ZOFRAN) IV, oxyCODONE, sodium chloride, traMADol  Xrays Dg Chest 2 View  11/10/2014  CLINICAL DATA:  Status post CABG and aortic valve replacement. EXAM: CHEST  2 VIEW COMPARISON:  11/09/2014 FINDINGS: Sequelae of CABG and aortic valve replacement are again identified. Swan-Ganz catheter and mediastinal drains have been removed. Cardiac silhouette is mildly enlarged. Biapical pleural thickening is unchanged. There are small left and possibly trace right pleural effusions. Mild left greater than right basilar opacities are unchanged and most likely reflect atelectasis. No overt pulmonary edema or pneumothorax. No acute osseous abnormality. IMPRESSION: 1. Interval removal of Swan-Ganz catheter and mediastinal drains. 2. Unchanged bibasilar atelectasis.  Small left pleural effusion. Electronically Signed   By: Logan Bores M.D.   On: 11/10/2014 07:26   Dg Chest Port 1 View  11/09/2014  CLINICAL DATA:  Aortic valve replacement . EXAM: PORTABLE CHEST 1 VIEW COMPARISON:  11/08/2014 .  FINDINGS: Interim extubation removal of NG tube. Swan-Ganz catheter mediastinal drainage catheters are in stable position. Heart size stable. Prior median sternotomy and cardiac valve replacement. Heart size normal . No focal infiltrate. Basilar atelectasis. No pleural effusion. No pneumothorax. IMPRESSION: 1. Interim extubation and removal of NG tube. Remaining lines and tubes stable position. 2. Prior median sternotomy and cardiac valve replacement. Electronically Signed   By: Marcello Moores  Register   On: 11/09/2014 07:54   Dg Chest Port 1 View  11/08/2014  CLINICAL  DATA:  Status post aortic valve replacement. EXAM: PORTABLE CHEST 1 VIEW COMPARISON:  November 06, 2014. FINDINGS: Endotracheal and nasogastric tubes are in grossly good position. Right internal jugular Swan-Ganz catheter is noted with distal tip in the expected position of the main pulmonary artery. Status post aortic valve replacement. No pneumothorax or significant pleural effusion is noted. No acute pulmonary disease is noted. IMPRESSION: Endotracheal and nasogastric tubes in grossly good position. No definite pneumothorax is noted. Electronically Signed   By: Marijo Conception, M.D.   On: 11/08/2014 13:24    Assessment/Plan: S/P Procedure(s) (LRB): AORTIC VALVE REPLACEMENT (AVR) WITH 23MM MAGNA EASE AORTIC  PORCINE/TISSUE HEART VALVE (N/A) TRANSESOPHAGEAL ECHOCARDIOGRAM (TEE) (N/A)  1 doing well in sinus rhythm, hemodyn stable 2 creat increased  slightly , cont gentle diuresis and monitor 3 leukocytosis improved 4 H/H stable 5 sugars controlled  6 routine pulm toilet/rehab  LOS: 2 days    Kennedy,Daniel E 11/10/2014  Still needs o2 but feels well I have seen and examined Daniel Kennedy Sr. and agree with the above assessment  and plan.  Grace Isaac MD Beeper [phone] Office [phone] 11/10/2014 12:18 PM

## 2014-11-10 NOTE — Progress Notes (Signed)
Education completed with patient and wife.  Completed incision care, signs of infection, activity restrictions, and exercise progression.

## 2014-11-11 LAB — CBC
HCT: 34.4 % — ABNORMAL LOW (ref 39.0–52.0)
Hemoglobin: 11.3 g/dL — ABNORMAL LOW (ref 13.0–17.0)
MCH: 32.1 pg (ref 26.0–34.0)
MCHC: 32.8 g/dL (ref 30.0–36.0)
MCV: 97.7 fL (ref 78.0–100.0)
Platelets: 160 10*3/uL (ref 150–400)
RBC: 3.52 MIL/uL — ABNORMAL LOW (ref 4.22–5.81)
RDW: 13.2 % (ref 11.5–15.5)
WBC: 11.4 10*3/uL — ABNORMAL HIGH (ref 4.0–10.5)

## 2014-11-11 LAB — BASIC METABOLIC PANEL
Anion gap: 8 (ref 5–15)
BUN: 21 mg/dL — ABNORMAL HIGH (ref 6–20)
CO2: 25 mmol/L (ref 22–32)
Calcium: 8.2 mg/dL — ABNORMAL LOW (ref 8.9–10.3)
Chloride: 103 mmol/L (ref 101–111)
Creatinine, Ser: 1.42 mg/dL — ABNORMAL HIGH (ref 0.61–1.24)
GFR calc Af Amer: 55 mL/min — ABNORMAL LOW (ref >60)
GFR calc non Af Amer: 47 mL/min — ABNORMAL LOW (ref >60)
Glucose, Bld: 106 mg/dL — ABNORMAL HIGH (ref 65–99)
Potassium: 3.9 mmol/L (ref 3.5–5.1)
Sodium: 136 mmol/L (ref 135–145)

## 2014-11-11 NOTE — Discharge Summary (Signed)
Physician Discharge Summary  Patient ID: Daniel RAINERI Sr. MRN: [id number] DOB/AGE: 74-Dec-1942 74 y.o.  Admit date: 11/08/2014 Discharge date: 11/11/2014  Admission Daniel Kennedy  Discharge Diagnoses:  Active Problems:   S/P AVR  Patient Active Problem List   Diagnosis Date Noted  . 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 His symptoms resolve with rest. His wife feels like he has not been able to do the things he used to be able to do like working in the yard and taking walks. An echo on 09/18/2014 reportedly showed a mean aortic valve gradient of 37 mm Hg and a peak of 64 mm Hg with a calcified and thickened aortic valve. LV function was normal. He was referred to Dr. Ubaldo Glassing and then to Dr. Burt Knack for consideration of TAVR.   He lives in [place] with his wife. He has 3 adult children. He has never been hospitalized. He continues to smoke 1/3 pack of cigarettes per day and has since he was old enough to smoke.  He was admitted this hospitalization for elective aortic valve replacement.  Discharged Condition: good  Hospital Course: the patient was admitted electively and taken to the operating room on 11/08/2014. He underwent the procedure as described below. He tolerated it well and was taken to the surgical intensive care unit in stable condition. Postoperatively he has done quite well. He was extubated using standard protocols without difficulty. He is remained neurologically intact. He has maintained normal sinus rhythm. All routine lines, monitors and drainage devices have been discontinued in standard fashion. Oxygen was weaned without difficulty. He is tolerating routine postoperative cardiac rehabilitation without difficulty ambulating in  halls. Incision is healing well without evidence of infection. He has a mild acute blood loss anemia which is stable. At time of discharge he was felt to be quite stable.  Consults: None  Significant Diagnostic Studies: routine post op labs/CXR  Treatments:Surgery CARDIOVASCULAR SURGERY OPERATIVE NOTE  11/08/2014 Daniel MCDERMID Sr. 74 [id number]  Surgeon: Gaye Pollack, MD  First Assistant: Jadene Pierini, PA-C   Preoperative Diagnosis: Severe aortic Kennedy   Postoperative Diagnosis: Same   Procedure:  1. Median Sternotomy 2. Extracorporeal circulation 3. Aortic valve replacement using a 23 mm Edwards Magna-Ease pericardial valve.  Anesthesia: General Endotracheal     Discharge Exam: Blood pressure 120/70, pulse 80, temperature 99.4 F (37.4 C), temperature source Oral, resp. rate 18, weight 161 lb 8 oz (73.256 kg), SpO2 92 %.   General appearance: alert, cooperative and no distress Heart: regular rate and rhythm and no murmur Lungs: mildly dim in the bases Abdomen: benign Extremities: no edema Wound: incis healing well Disposition: 01-Home or Self Care  Discharge Instructions    Amb Referral to Cardiac Rehabilitation    Complete by:  As directed   Diagnosis:  Valve Replacement/Repair              Follow-up Information    Follow up with Gaye Pollack, MD.   Specialty:  Cardiothoracic Surgery   Why:  The office will contact her regarding the appointment to see Dr. Cyndia Bent in 4 weeks and to see the nurse next week for suture removal.   Contact information:   [address]  [place] [address] [phone]       Follow up with Teodoro Spray., MD.   Specialty:  Cardiology   Why:  Please contact the cardiologist office to arrange a 2 week appointment for follow-up.   Contact information:   [place] [address] [phone]      Medications at time of discharge:   Medication List    STOP taking these medications         sildenafil 20 MG tablet  Commonly known as:  REVATIO      TAKE these medications        aspirin 81 MG EC tablet  Take 1 tablet (81 mg total) by mouth daily.     ferrous sulfate 325 (65 FE) MG tablet  Take 1 tablet by mouth daily.     levocetirizine 5 MG tablet  Commonly known as:  XYZAL  Take 5 mg by mouth daily.     metoprolol tartrate 25 MG tablet  Commonly known as:  LOPRESSOR  Take 0.5 tablets (12.5 mg total) by mouth 2 (two) times daily.     montelukast 10 MG tablet  Commonly known as:  SINGULAIR  Take 10 mg by mouth at bedtime.     MULTI-VITAMINS Tabs  Take 1 tablet by mouth daily.     omeprazole 20 MG capsule  Commonly known as:  PRILOSEC  Take 20 mg by mouth daily.     oxyCODONE 5 MG immediate release tablet  Commonly known as:  Oxy IR/ROXICODONE  Take 1-2 tablets (5-10 mg total) by mouth every 6 (six) hours as needed for severe pain.     tamsulosin 0.4 MG Caps capsule  Commonly known as:  FLOMAX  Take 0.4 mg by mouth daily.     vitamin C 500 MG tablet  Commonly known as:  ASCORBIC ACID  Take 500 mg by mouth daily.     vitamin E 400 UNIT capsule  Generic drug:  vitamin E  Take 400 Units by mouth daily.         The patient has been discharged on:   1.Beta Blocker:  Yes [ Y ]                              No   [   ]                              If No, reason:  2.Ace Inhibitor/ARB: Yes [   ]                                     No  [  N  ]                                     If No, reason: NO CAD OR HTN  3.Statin:   Yes [   ]                  No  [  N ]                  If No, reason:NO CAD  4.Shela CommonsVelta Addison  [ Y  ]  No   [   ]                  If No, reason:   Signed: Ulrick Methot E 11/11/2014, 12:47 PM

## 2014-11-11 NOTE — Progress Notes (Signed)
Epicardial Pacing wires discontinued per protocol and order. Tips intact. R site noted small amount serosanguinous drainage, covered with gauze and tape. Vital signs stable. Incisions painted with betadine. Pt educated on need for vital signs q15 and bedrest 1 hour. Call bell and phone within reach. Will continue to monitor.

## 2014-11-11 NOTE — Progress Notes (Addendum)
[address]       [place] [address]             [phone]      3 Days Post-Op Procedure(s) (LRB): AORTIC VALVE REPLACEMENT (AVR) WITH 23MM MAGNA EASE AORTIC  PORCINE/TISSUE HEART VALVE (N/A) TRANSESOPHAGEAL ECHOCARDIOGRAM (TEE) (N/A) Subjective: Cont to feel well  Objective: Vital signs in last 24 hours: Temp:  [99.4 F (37.4 C)-99.9 F (37.7 C)] 99.4 F (37.4 C) (10/23 0439) Pulse Rate:  [95-104] 97 (10/23 0439) Cardiac Rhythm:  [-] Sinus tachycardia (10/23 0700) Resp:  [18] 18 (10/23 0439) BP: (99-130)/(53-69) 130/69 mmHg (10/23 0439) SpO2:  [88 %-94 %] 88 % (10/23 0439) Weight:  [161 lb 8 oz (73.256 kg)] 161 lb 8 oz (73.256 kg) (10/23 0439)  Hemodynamic parameters for last 24 hours:    Intake/Output from previous day: 10/22 0701 - 10/23 0700 In: 480 [P.O.:480] Out: -  Intake/Output this shift:    General appearance: alert, cooperative and no distress Heart: regular rate and rhythm and no murmur Lungs: clear to auscultation bilaterally Abdomen: benign Incis: healing well   Lab Results:  Recent Labs  11/10/14 0329 11/11/14 0248  WBC 14.3* 11.4*  HGB 12.1* 11.3*  HCT 37.0* 34.4*  PLT 155 160   BMET:  Recent Labs  11/10/14 0329 11/11/14 0248  NA 133* 136  K 4.3 3.9  CL 104 103  CO2 25 25  GLUCOSE 124* 106*  BUN 21* 21*  CREATININE 1.55* 1.42*  CALCIUM 8.2* 8.2*    PT/INR:  Recent Labs  11/08/14 1230  LABPROT 18.1*  INR 1.49   ABG    Component Value Date/Time   PHART 7.315* 11/08/2014 1802   HCO3 20.5 11/08/2014 1802   TCO2 19 11/08/2014 1805   ACIDBASEDEF 6.0* 11/08/2014 1802   O2SAT 98.0 11/08/2014 1802   CBG (last 3)   Recent Labs  11/09/14 1641 11/09/14 2038 11/10/14 0616  GLUCAP 136* 156* 116*    Meds Scheduled Meds: . antiseptic oral rinse  7 mL Mouth Rinse QID  . aspirin EC  81 mg Oral Daily  . chlorhexidine gluconate  15 mL Mouth Rinse BID  . docusate sodium  200 mg Oral Daily  .  famotidine  20 mg Oral BID  . furosemide  40 mg Oral Daily  . metoprolol tartrate  12.5 mg Oral BID  . sodium chloride  3 mL Intravenous Q12H  . tamsulosin  0.4 mg Oral Daily   Continuous Infusions:  PRN Meds:.sodium chloride, acetaminophen, bisacodyl **OR** bisacodyl, ondansetron **OR** ondansetron (ZOFRAN) IV, oxyCODONE, sodium chloride, traMADol  Xrays Dg Chest 2 View  11/10/2014  CLINICAL DATA:  Status post CABG and aortic valve replacement. EXAM: CHEST  2 VIEW COMPARISON:  11/09/2014 FINDINGS: Sequelae of CABG and aortic valve replacement are again identified. Swan-Ganz catheter and mediastinal drains have been removed. Cardiac silhouette is mildly enlarged. Biapical pleural thickening is unchanged. There are small left and possibly trace right pleural effusions. Mild left greater than right basilar opacities are unchanged and most likely reflect atelectasis. No overt pulmonary edema or pneumothorax. No acute osseous abnormality. IMPRESSION: 1. Interval removal of Swan-Ganz catheter and mediastinal drains. 2. Unchanged bibasilar atelectasis.  Small left pleural effusion. Electronically Signed   By: Logan Bores M.D.   On: 11/10/2014 07:26    Assessment/Plan:  1 conts to do well 2 d/c epw's today 3 creat conts to improve 4 leukocytosis improved 5 gentle diuresis 6 poss home  in am if no new issues  S/P Procedure(s) (LRB): AORTIC VALVE REPLACEMENT (AVR) WITH 23MM MAGNA EASE AORTIC  PORCINE/TISSUE HEART VALVE (N/A) TRANSESOPHAGEAL ECHOCARDIOGRAM (TEE) (N/A)    LOS: 3 days    GOLD,[name] 11/11/2014  Doing well post op no afib, Poss home in am I have seen and examined Jory Sims Sr. and agree with the above assessment  and plan.  Grace Isaac MD Beeper [phone] Office [phone] 11/11/2014 12:24 PM

## 2014-11-11 NOTE — Discharge Instructions (Signed)
Aortic Valve Replacement, Care After °Refer to this sheet in the next few weeks. These instructions provide you with information on caring for yourself after your procedure. Your health care provider may also give you specific instructions. Your treatment has been planned according to current medical practices, but problems sometimes occur. Call your health care provider if you have any problems or questions after your procedure. °HOME CARE INSTRUCTIONS  °· Take medicines only as directed by your health care provider. °· If your health care provider has prescribed elastic stockings, wear them as directed. °· Take frequent naps or rest often throughout the day. °· Avoid lifting over 10 lbs (4.5 kg) or pushing or pulling things with your arms for 6-8 weeks or as directed by your health care provider. °· Avoid driving or airplane travel for 4-6 weeks after surgery or as directed by your health care provider. If you are riding in a car for an extended period, stop every 1-2 hours to stretch your legs. Keep a record of your medicines and medical history with you when traveling. °· Do not drive or operate heavy machinery while taking pain medicine. (narcotics). °· Do not cross your legs. °· Do not use any tobacco products including cigarettes, chewing tobacco, or electronic cigarettes. If you need help quitting, ask your health care provider. °· Do not take baths, swim, or use a hot tub until your health care provider approves. Take showers once your health care provider approves. Pat incisions dry. Do not rub incisions with a washcloth or towel. °· Avoid climbing stairs and using the handrail to pull yourself up for the first 2-3 weeks after surgery. °· Return to work as directed by your health care provider. °· Drink enough fluid to keep your urine clear or pale yellow. °· Do not strain to have a bowel movement. Eat high-fiber foods if you become constipated. You may also take a medicine to help you have a bowel  movement (laxative) as directed by your health care provider. °· Resume sexual activity as directed by your health care provider. Men should not use medicines for erectile dysfunction until their doctor says it is okay. °· If you had a certain type of heart condition in the past, you may need to take antibiotic medicine before having dental work or surgery. Let your dentist and health care providers know if you had one or more of the following: °¨ Previous endocarditis. °¨ An artificial (prosthetic) heart valve. °¨ Congenital heart disease. °SEEK MEDICAL CARE IF: °· You develop a skin rash.   °· You experience sudden changes in your weight. °· You have a fever. °SEEK IMMEDIATE MEDICAL CARE IF:  °· You develop chest pain that is not coming from your incision. °· You have drainage (pus), redness, swelling, or pain at your incision site.   °· You develop shortness of breath or have difficulty breathing.   °· You have increased bleeding from your incision site.   °· You develop light-headedness.   °MAKE SURE YOU:  °· Understand these directions. °· Will watch your condition. °· Will get help right away if you are not doing well or get worse. °  °This information is not intended to replace advice given to you by your health care provider. Make sure you discuss any questions you have with your health care provider. °  °Document Released: 07/24/2004 Document Revised: 01/26/2014 Document Reviewed: 10/20/2011 °Elsevier Interactive Patient Education ©2016 Elsevier Inc. ° °

## 2014-11-12 MED ORDER — OXYCODONE HCL 5 MG PO TABS: 5.0000 mg | ORAL_TABLET | Freq: Four times a day (QID) | ORAL | Status: DC | PRN

## 2014-11-12 MED ORDER — ASPIRIN 81 MG PO TBEC: 81.0000 mg | DELAYED_RELEASE_TABLET | Freq: Every day | ORAL | Status: AC

## 2014-11-12 MED ORDER — METOPROLOL TARTRATE 25 MG PO TABS: 12.5000 mg | ORAL_TABLET | Freq: Two times a day (BID) | ORAL | Status: DC

## 2014-11-12 NOTE — Progress Notes (Addendum)
      [address]       [place] [id number]             [phone]      4 Days Post-Op Procedure(s) (LRB): AORTIC VALVE REPLACEMENT (AVR) WITH 23MM MAGNA EASE AORTIC  PORCINE/TISSUE HEART VALVE (N/A) TRANSESOPHAGEAL ECHOCARDIOGRAM (TEE) (N/A) Subjective: Feels well  Objective: Vital signs in last 24 hours: Temp:  [98.5 F (36.9 C)-99.5 F (37.5 C)] 98.5 F (36.9 C) (10/24 0518) Pulse Rate:  [79-93] 92 (10/24 0518) Cardiac Rhythm:  [-] Normal sinus rhythm (10/23 1900) Resp:  [18] 18 (10/24 0518) BP: (117-136)/(67-80) 136/78 mmHg (10/24 0518) SpO2:  [92 %-93 %] 92 % (10/24 0518) Weight:  [160 lb 0.9 oz (72.6 kg)] 160 lb 0.9 oz (72.6 kg) (10/24 0518)  Hemodynamic parameters for last 24 hours:    Intake/Output from previous day: 10/23 0701 - 10/24 0700 In: 240 [P.O.:240] Out: -  Intake/Output this shift:    General appearance: alert, cooperative and no distress Heart: regular rate and rhythm and no murmur Lungs: mildly dim in the bases Abdomen: benign Extremities: no edema Wound: incis healing well  Lab Results:  Recent Labs  11/10/14 0329 11/11/14 0248  WBC 14.3* 11.4*  HGB 12.1* 11.3*  HCT 37.0* 34.4*  PLT 155 160   BMET:  Recent Labs  11/10/14 0329 11/11/14 0248  NA 133* 136  K 4.3 3.9  CL 104 103  CO2 25 25  GLUCOSE 124* 106*  BUN 21* 21*  CREATININE 1.55* 1.42*  CALCIUM 8.2* 8.2*    PT/INR: No results for input(s): LABPROT, INR in the last 72 hours. ABG    Component Value Date/Time   PHART 7.315* 11/08/2014 1802   HCO3 20.5 11/08/2014 1802   TCO2 19 11/08/2014 1805   ACIDBASEDEF 6.0* 11/08/2014 1802   O2SAT 98.0 11/08/2014 1802   CBG (last 3)   Recent Labs  11/09/14 1641 11/09/14 2038 11/10/14 0616  GLUCAP 136* 156* 116*    Meds Scheduled Meds: . antiseptic oral rinse  7 mL Mouth Rinse QID  . aspirin EC  81 mg Oral Daily  . chlorhexidine gluconate  15 mL Mouth Rinse BID  . docusate sodium  200 mg Oral  Daily  . famotidine  20 mg Oral BID  . furosemide  40 mg Oral Daily  . metoprolol tartrate  12.5 mg Oral BID  . sodium chloride  3 mL Intravenous Q12H  . tamsulosin  0.4 mg Oral Daily   Continuous Infusions:  PRN Meds:.sodium chloride, acetaminophen, bisacodyl **OR** bisacodyl, ondansetron **OR** ondansetron (ZOFRAN) IV, oxyCODONE, sodium chloride, traMADol  Xrays No results found.  Assessment/Plan: S/P Procedure(s) (LRB): AORTIC VALVE REPLACEMENT (AVR) WITH 23MM MAGNA EASE AORTIC  PORCINE/TISSUE HEART VALVE (N/A) TRANSESOPHAGEAL ECHOCARDIOGRAM (TEE) (N/A)  1 conts to do very well- stable for discharge     LOS: 4 days    GOLD,[name] 11/12/2014   Chart reviewed, patient examined, agree with above.

## 2014-11-12 NOTE — Care Management Important Message (Signed)
Important Message  Patient Details  Name: Daniel SILGUERO Sr. MRN: [id number] Date of Birth: Apr 26, 1940   Medicare Important Message Given:  Yes-second notification given    Nathen May 11/12/2014, 10:27 AM

## 2014-11-12 NOTE — Care Management Note (Signed)
Case Management Note Marvetta Gibbons RN, BSN Unit 2W-Case Manager [phone]  Patient Details  Name: Daniel MARK Sr. MRN: [id number] Date of Birth: 02-16-1940  Subjective/Objective:    Pt admitted s/p AVR                Action/Plan: PTA pt lived at home with spouse- plan to return home  Expected Discharge Date:      11/12/14            Expected Discharge Plan:  Home/Self Care  In-House Referral:     Discharge planning Services  CM Consult  Post Acute Care Choice:    Choice offered to:     DME Arranged:    DME Agency:     HH Arranged:    [place] Agency:     Status of Service:  Completed, signed off  Medicare Important Message Given:  Yes-second notification given Date Medicare IM Given:    Medicare IM give by:    Date Additional Medicare IM Given:    Additional Medicare Important Message give by:     If discussed at [place] of Stay Meetings, dates discussed:    Additional Comments:  Dawayne Patricia, RN 11/12/2014, 10:28 AM

## 2014-11-12 NOTE — Progress Notes (Signed)
Utilization review completed.  

## 2014-11-12 NOTE — Progress Notes (Signed)
CARDIAC REHAB PHASE I   Pt ready for d/c. Sts he walked this am without O2 or problems. SaO2 93-94 RA in bed. Encouraged IS, smoking cessation and CRPII. Will send referral to [facility].  [place], [place], ACSM 11/12/2014 10:05 AM

## 2014-11-12 NOTE — Progress Notes (Signed)
Discharge instructions given to patient and wife, voiced understanding, prescriptions given.   Iv site and Telemetry discontinued.  Mervyn Skeeters, RN

## 2014-11-20 ENCOUNTER — Ambulatory Visit: Admit: 2014-11-20 | Payer: Commercial Managed Care - HMO | Admitting: Gastroenterology

## 2014-11-20 SURGERY — COLONOSCOPY WITH PROPOFOL
Anesthesia: General

## 2014-11-21 ENCOUNTER — Encounter (INDEPENDENT_AMBULATORY_CARE_PROVIDER_SITE_OTHER): Payer: Self-pay

## 2014-11-21 DIAGNOSIS — I350 Nonrheumatic aortic (valve) stenosis: Secondary | ICD-10-CM

## 2014-11-21 DIAGNOSIS — Z954 Presence of other heart-valve replacement: Secondary | ICD-10-CM

## 2014-11-26 ENCOUNTER — Encounter: Payer: Self-pay | Admitting: *Deleted

## 2014-12-10 ENCOUNTER — Other Ambulatory Visit: Payer: Self-pay | Admitting: Surgery

## 2014-12-10 DIAGNOSIS — Z952 Presence of prosthetic heart valve: Secondary | ICD-10-CM

## 2014-12-12 ENCOUNTER — Ambulatory Visit
Admission: RE | Admit: 2014-12-12 | Discharge: 2014-12-12 | Disposition: A | Discharge status: Home / Self Care | Payer: Commercial Managed Care - HMO | Source: Ambulatory Visit | Attending: Surgery | Admitting: Surgery

## 2014-12-12 ENCOUNTER — Ambulatory Visit (INDEPENDENT_AMBULATORY_CARE_PROVIDER_SITE_OTHER): Payer: Self-pay | Admitting: Surgery

## 2014-12-12 ENCOUNTER — Encounter: Payer: Self-pay | Admitting: Surgery

## 2014-12-12 VITALS — BP 118/73 | HR 77 | Resp 20 | Ht 74.0 in | Wt 160.0 lb

## 2014-12-12 DIAGNOSIS — Z954 Presence of other heart-valve replacement: Secondary | ICD-10-CM

## 2014-12-12 DIAGNOSIS — Z952 Presence of prosthetic heart valve: Secondary | ICD-10-CM

## 2014-12-12 DIAGNOSIS — I350 Nonrheumatic aortic (valve) stenosis: Secondary | ICD-10-CM

## 2014-12-12 NOTE — Progress Notes (Signed)
HPI:  Patient returns for routine postoperative follow-up having undergone aortic valve replacement with a 23 mm Edwards pericardial valve on 11/08/2014. The patient's early postoperative recovery while in the hospital was notable for an uncomplicated postop course. Since hospital discharge the patient reports that his stamina is improving and he is walking well. He reports having a sneeze recently with sharp pain just to the right of his mid sternum and has had some persistent pain there since. He also reports multiple loose stools per day since going home. He took some Imodium as directed by his PCP but that did not resolve it. He has had some diarrhea in the past.   Current Outpatient Prescriptions  Medication Sig Dispense Refill  . aspirin EC 81 MG EC tablet Take 1 tablet (81 mg total) by mouth daily.    . ferrous sulfate 325 (65 FE) MG tablet Take 1 tablet by mouth daily.    Marland Kitchen levocetirizine (XYZAL) 5 MG tablet Take 5 mg by mouth daily.    . metoprolol tartrate (LOPRESSOR) 25 MG tablet Take 0.5 tablets (12.5 mg total) by mouth 2 (two) times daily. 31 tablet 1  . montelukast (SINGULAIR) 10 MG tablet Take 10 mg by mouth at bedtime.    . Multiple Vitamin (MULTI-VITAMINS) TABS Take 1 tablet by mouth daily.    Marland Kitchen omeprazole (PRILOSEC) 20 MG capsule Take 20 mg by mouth daily.    . tamsulosin (FLOMAX) 0.4 MG CAPS capsule Take 0.4 mg by mouth daily.    . vitamin C (ASCORBIC ACID) 500 MG tablet Take 500 mg by mouth daily.    . vitamin E (VITAMIN E) 400 UNIT capsule Take 400 Units by mouth daily.     No current facility-administered medications for this visit.    Physical Exam: BP 118/73 mmHg  Pulse 77  Resp 20  Ht 6\' 2"  (1.88 m)  Wt 160 lb (72.576 kg)  BMI 20.53 kg/m2  SpO2 90% He looks well. Lung exam is clear. Cardiac exam shows a regular rate and rhythm with normal heart sounds. Chest incision is healing well and sternum is stable. There is no peripheral  edema.   Diagnostic Tests:  CLINICAL DATA: Post aortic valve replacement on 11/08/2014, some mid upper chest pain, smoking history  EXAM: CHEST 2 VIEW  COMPARISON: Chest x-ray of 11/10/2014, and chest x-ray of 11/06/2014  FINDINGS: There is no evidence of parenchymal infiltrate and no pleural effusion is seen. The lungs remain somewhat hyperaerated. Biapical pleural thickening remains. An aortic valve replacement is noted. Mediastinal and hilar contours are unremarkable and the heart is within normal limits in size. Median sternotomy sutures are noted.  IMPRESSION: No active cardiopulmonary disease.   Electronically Signed  By: Ivar Drape M.D.  On: 12/12/2014 10:52   Impression:  Overall I think he is doing well. I encouraged him to continue walking. He is planning to participate in cardiac rehab. I told him he could drive his car but should not lift anything heavier than 10 lbs for three months postop. The etiology of his diarrhea is unclear. His only new medication postop is Lopressor. This is reportedly a common side effect although I have not seen that.    Plan:  He is going to follow up with his PCP concerning his diarrhea. I will have him stop the Lopressor for now to see if this helps and if not then he will require further workup with a sample sent for C. Difficile.   Gaspar Bidding  Alveria Apley, MD [facility] [phone]

## 2015-01-21 DIAGNOSIS — H25813 Combined forms of age-related cataract, bilateral: Secondary | ICD-10-CM | POA: Diagnosis not present

## 2015-01-23 DIAGNOSIS — I359 Nonrheumatic aortic valve disorder, unspecified: Secondary | ICD-10-CM | POA: Diagnosis not present

## 2015-01-23 DIAGNOSIS — F17200 Nicotine dependence, unspecified, uncomplicated: Secondary | ICD-10-CM | POA: Diagnosis not present

## 2015-01-23 DIAGNOSIS — N401 Enlarged prostate with lower urinary tract symptoms: Secondary | ICD-10-CM | POA: Diagnosis not present

## 2015-01-23 DIAGNOSIS — Z954 Presence of other heart-valve replacement: Secondary | ICD-10-CM | POA: Diagnosis not present

## 2015-02-05 ENCOUNTER — Encounter: Payer: Self-pay | Admitting: Emergency Medicine

## 2015-02-05 ENCOUNTER — Emergency Department
Admission: EM | Admit: 2015-02-05 | Discharge: 2015-02-06 | Disposition: A | Discharge status: Home / Self Care | Payer: PPO | Attending: Emergency Medicine | Admitting: Emergency Medicine

## 2015-02-05 DIAGNOSIS — Z880 Allergy status to penicillin: Secondary | ICD-10-CM | POA: Diagnosis not present

## 2015-02-05 DIAGNOSIS — F17210 Nicotine dependence, cigarettes, uncomplicated: Secondary | ICD-10-CM | POA: Insufficient documentation

## 2015-02-05 DIAGNOSIS — Z7982 Long term (current) use of aspirin: Secondary | ICD-10-CM | POA: Diagnosis not present

## 2015-02-05 DIAGNOSIS — N390 Urinary tract infection, site not specified: Secondary | ICD-10-CM | POA: Insufficient documentation

## 2015-02-05 DIAGNOSIS — R339 Retention of urine, unspecified: Secondary | ICD-10-CM | POA: Diagnosis not present

## 2015-02-05 DIAGNOSIS — Z79899 Other long term (current) drug therapy: Secondary | ICD-10-CM | POA: Diagnosis not present

## 2015-02-05 DIAGNOSIS — D72829 Elevated white blood cell count, unspecified: Secondary | ICD-10-CM | POA: Diagnosis not present

## 2015-02-05 HISTORY — DX: Enterocolitis due to Clostridium difficile, not specified as recurrent: A04.72

## 2015-02-05 LAB — URINALYSIS COMPLETE WITH MICROSCOPIC (ARMC ONLY)
BILIRUBIN URINE: NEGATIVE
Glucose, UA: NEGATIVE mg/dL
Hgb urine dipstick: NEGATIVE
KETONES UR: NEGATIVE mg/dL
NITRITE: NEGATIVE
PROTEIN: 30 mg/dL — AB
SPECIFIC GRAVITY, URINE: 1.018 (ref 1.005–1.030)
pH: 5 (ref 5.0–8.0)

## 2015-02-05 LAB — CBC
HCT: 44.3 % (ref 40.0–52.0)
HEMOGLOBIN: 14.6 g/dL (ref 13.0–18.0)
MCH: 32.3 pg (ref 26.0–34.0)
MCHC: 33 g/dL (ref 32.0–36.0)
MCV: 97.6 fL (ref 80.0–100.0)
PLATELETS: 217 10*3/uL (ref 150–440)
RBC: 4.54 MIL/uL (ref 4.40–5.90)
RDW: 13.1 % (ref 11.5–14.5)
WBC: 29.9 10*3/uL — ABNORMAL HIGH (ref 3.8–10.6)

## 2015-02-05 LAB — BASIC METABOLIC PANEL
Anion gap: 5 (ref 5–15)
BUN: 20 mg/dL (ref 6–20)
CHLORIDE: 106 mmol/L (ref 101–111)
CO2: 25 mmol/L (ref 22–32)
CREATININE: 1.28 mg/dL — AB (ref 0.61–1.24)
Calcium: 8.3 mg/dL — ABNORMAL LOW (ref 8.9–10.3)
GFR, EST NON AFRICAN AMERICAN: 53 mL/min — AB (ref >60)
Glucose, Bld: 140 mg/dL — ABNORMAL HIGH (ref 65–99)
POTASSIUM: 4.1 mmol/L (ref 3.5–5.1)
SODIUM: 136 mmol/L (ref 135–145)

## 2015-02-05 LAB — LACTIC ACID, PLASMA: Lactic Acid, Venous: 1.4 mmol/L (ref 0.5–2.0)

## 2015-02-05 MED ORDER — CEPHALEXIN 500 MG PO CAPS: 500.0000 mg | ORAL_CAPSULE | Freq: Three times a day (TID) | ORAL | Status: DC

## 2015-02-05 MED ORDER — DEXTROSE 5 % IV SOLN
1.0000 g | Freq: Once | INTRAVENOUS | Status: AC
  Administered 2015-02-05: 1 g via INTRAVENOUS
  Filled 2015-02-05: qty 10

## 2015-02-05 MED ORDER — SODIUM CHLORIDE 0.9 % IV BOLUS (SEPSIS)
1000.0000 mL | Freq: Once | INTRAVENOUS | Status: AC
  Administered 2015-02-05: 1000 mL via INTRAVENOUS

## 2015-02-05 MED ORDER — METRONIDAZOLE 500 MG PO TABS: 500.0000 mg | ORAL_TABLET | Freq: Three times a day (TID) | ORAL | Status: AC

## 2015-02-05 NOTE — ED Provider Notes (Signed)
[facility] Emergency Department Provider Note    ____________________________________________  Time seen: 2020  I have reviewed the triage vital signs and the nursing notes.   HISTORY  Chief Complaint Urinary Retention   History limited by: Not Limited   HPI Daniel OPDYKE Sr. is a 75 y.o. male who presents to the emergency department today because of concerns for painful urination and difficulty with urination. He states the symptoms started 2 days ago. The pain is only present when he urinates. He denies any lower abdominal discomfort otherwise. He states he feels like he has been voiding a smaller amount than normal. He has been able to void and has not had any urinary retention. Patient has had subjective fevers but no measured fevers.   Past Medical History  Diagnosis Date  . BPH (benign prostatic hyperplasia)   . Asthma   . Aortic stenosis     EF 50% echo 2010  . Heart murmur   . Shortness of breath dyspnea     WITH EXERTION   . GERD (gastroesophageal reflux disease)   . C. difficile colitis     Patient Active Problem List   Diagnosis Date Noted  . 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 Severe aortic stenosis 10/11/2014    Past Surgical History  Procedure Laterality Date  . Cardiac catheterization N/A 10/19/2014    Procedure: Right/Left Heart Cath and Coronary Angiography;  Surgeon: Sherren Mocha, MD;  Location: [facility];  Service: Cardiovascular;  Laterality: N/A;  . Aortic valve replacement N/A 11/08/2014    Procedure: AORTIC VALVE REPLACEMENT (AVR) WITH 23MM MAGNA EASE AORTIC  PORCINE/TISSUE HEART VALVE;  Surgeon: Gaye Pollack, MD;  Location: [facility];  Service: Open Heart Surgery;  Laterality: N/A;  . Tee without cardioversion N/A 11/08/2014    Procedure: TRANSESOPHAGEAL ECHOCARDIOGRAM (TEE);  Surgeon: Gaye Pollack, MD;  Location: [facility];  Service: Open Heart Surgery;  Laterality: N/A;    Current Outpatient Rx  Name  Route  Sig  Dispense   Refill  . aspirin EC 81 MG EC tablet   Oral   Take 1 tablet (81 mg total) by mouth daily.         . ferrous sulfate 325 (65 FE) MG tablet   Oral   Take 1 tablet by mouth daily.         Marland Kitchen EXPIRED: levocetirizine (XYZAL) 5 MG tablet   Oral   Take 5 mg by mouth daily.         . metoprolol tartrate (LOPRESSOR) 25 MG tablet   Oral   Take 0.5 tablets (12.5 mg total) by mouth 2 (two) times daily.   31 tablet   1   . montelukast (SINGULAIR) 10 MG tablet   Oral   Take 10 mg by mouth at bedtime.         . Multiple Vitamin (MULTI-VITAMINS) TABS   Oral   Take 1 tablet by mouth daily.         Marland Kitchen omeprazole (PRILOSEC) 20 MG capsule   Oral   Take 20 mg by mouth daily.         . tamsulosin (FLOMAX) 0.4 MG CAPS capsule   Oral   Take 0.4 mg by mouth daily.         . vitamin C (ASCORBIC ACID) 500 MG tablet   Oral   Take 500 mg by mouth daily.         . vitamin E (VITAMIN E) 400  UNIT capsule   Oral   Take 400 Units by mouth daily.           Allergies Penicillin g  Family History  Problem Relation Age of Onset  . Breast cancer Mother   . Lung cancer Mother   . Heart disease Father   . Diabetes Father     Social History Social History  Substance Use Topics  . Smoking status: Current Every Day Smoker -- 0.33 packs/day for 58 years    Types: Cigarettes  . Smokeless tobacco: None  . Alcohol Use: No    Review of Systems  Constitutional: Positive for subjective fever Cardiovascular: Negative for chest pain. Respiratory: Negative for shortness of breath. Gastrointestinal: Negative for abdominal pain, vomiting and diarrhea. Genitourinary: Positive for dysuria. Neurological: Negative for headaches, focal weakness or numbness.  10-point ROS otherwise negative.  ____________________________________________   PHYSICAL EXAM:  VITAL SIGNS: ED Triage Vitals  Enc Vitals Group     BP 02/05/15 1835 110/62 mmHg     Pulse Rate 02/05/15 1835 72     Resp  02/05/15 1835 18     Temp 02/05/15 1835 98.5 F (36.9 C)     Temp Source 02/05/15 1835 Oral     SpO2 02/05/15 1835 97 %     Weight 02/05/15 1835 160 lb (72.576 kg)     Height 02/05/15 1835 6\' 2"  (1.88 m)     Head Cir --      Peak Flow --      Pain Score 02/05/15 1835 3   Constitutional: Alert and oriented. Well appearing and in no distress. Eyes: Conjunctivae are normal. PERRL. Normal extraocular movements. ENT   Head: Normocephalic and atraumatic.   Nose: No congestion/rhinnorhea.   Mouth/Throat: Mucous membranes are moist.   Neck: No stridor. Hematological/Lymphatic/Immunilogical: No cervical lymphadenopathy. Cardiovascular: Normal rate, regular rhythm.  No murmurs, rubs, or gallops. Respiratory: Normal respiratory effort without tachypnea nor retractions. Breath sounds are clear and equal bilaterally. No wheezes/rales/rhonchi. Gastrointestinal: Soft and nontender. No distention. There is no CVA tenderness. Genitourinary: Deferred Musculoskeletal: Normal range of motion in all extremities. No joint effusions.  No lower extremity tenderness nor edema. Neurologic:  Normal speech and language. No gross focal neurologic deficits are appreciated.  Skin:  Skin is warm, dry and intact. No rash noted. Psychiatric: Mood and affect are normal. Speech and behavior are normal. Patient exhibits appropriate insight and judgment.  ____________________________________________    LABS (pertinent positives/negatives)  Labs Reviewed  CBC - Abnormal; Notable for the following:    WBC 29.9 (*)    All other components within normal limits  BASIC METABOLIC PANEL - Abnormal; Notable for the following:    Glucose, Bld 140 (*)    Creatinine, Ser 1.28 (*)    Calcium 8.3 (*)    GFR calc non Af Amer 53 (*)    All other components within normal limits  URINALYSIS COMPLETEWITH MICROSCOPIC ([facility] ONLY) - Abnormal; Notable for the following:    Color, Urine AMBER (*)    APPearance HAZY (*)     Protein, ur 30 (*)    Leukocytes, UA 2+ (*)    Bacteria, UA MANY (*)    Squamous Epithelial / LPF 0-5 (*)    All other components within normal limits  URINE CULTURE  LACTIC ACID, PLASMA  LACTIC ACID, PLASMA     ____________________________________________   EKG  None  ____________________________________________    RADIOLOGY  None   ____________________________________________   PROCEDURES  Procedure(s) performed:  None  Critical Care performed: No  ____________________________________________   INITIAL IMPRESSION / ASSESSMENT AND PLAN / ED COURSE  Pertinent labs & imaging results that were available during my care of the patient were reviewed by me and considered in my medical decision making (see chart for details).  Patient presented to the emergency department today with complaints of dysuria and difficulty with urination. Urine is consistent with a urinary tract infection. Patient had a significant leukocytosis however normal lactic acid level. Patient was without fever here and vital signs otherwise without concerning findings. Furthermore patient clinically looked very well during his stay here in the emergency department. At this point no signs concerning for sepsis. I did however offered and recommended admission for the patient given his leukocytosis. He chose to go home. I feel like this is a reasonable choice given his clinical picture at this time. He has received 1 dose of IV antibiotics. I did discuss with the patient the importance that he follow up with his primary care doctor tomorrow.  ____________________________________________   FINAL CLINICAL IMPRESSION(S) / ED DIAGNOSES  Final diagnoses:  UTI (lower urinary tract infection)  Leukocytosis     Nance Pear, MD 02/06/15 0101

## 2015-02-05 NOTE — ED Notes (Signed)
Unable to pass urine x 2.5 days.  Patient states able to pass small amounts of urine, but unable to empty bladder.  Last void at around 1700.

## 2015-02-05 NOTE — Discharge Instructions (Signed)
As we discussed it is very important that you follow up with your primary care doctor tomorrow (02/06/2015). It is important that you discuss the elevation of your white blood cells (infection/inflammation/immune cells) with your doctor, this level will need to be rechecked. Please seek medical attention for any high fevers, chest pain, shortness of breath, change in behavior, persistent vomiting, bloody stool or any other new or concerning symptoms.   Urinary Tract Infection Urinary tract infections (UTIs) can develop anywhere along your urinary tract. Your urinary tract is your body's drainage system for removing wastes and extra water. Your urinary tract includes two kidneys, two ureters, a bladder, and a urethra. Your kidneys are a pair of bean-shaped organs. Each kidney is about the size of your fist. They are located below your ribs, one on each side of your spine. CAUSES Infections are caused by microbes, which are microscopic organisms, including fungi, viruses, and bacteria. These organisms are so small that they can only be seen through a microscope. Bacteria are the microbes that most commonly cause UTIs. SYMPTOMS  Symptoms of UTIs may vary by age and gender of the patient and by the location of the infection. Symptoms in young women typically include a frequent and intense urge to urinate and a painful, burning feeling in the bladder or urethra during urination. Older women and men are more likely to be tired, shaky, and weak and have muscle aches and abdominal pain. A fever may mean the infection is in your kidneys. Other symptoms of a kidney infection include pain in your back or sides below the ribs, nausea, and vomiting. DIAGNOSIS To diagnose a UTI, your caregiver will ask you about your symptoms. Your caregiver will also ask you to provide a urine sample. The urine sample will be tested for bacteria and white blood cells. White blood cells are made by your body to help fight  infection. TREATMENT  Typically, UTIs can be treated with medication. Because most UTIs are caused by a bacterial infection, they usually can be treated with the use of antibiotics. The choice of antibiotic and length of treatment depend on your symptoms and the type of bacteria causing your infection. HOME CARE INSTRUCTIONS  If you were prescribed antibiotics, take them exactly as your caregiver instructs you. Finish the medication even if you feel better after you have only taken some of the medication.  Drink enough water and fluids to keep your urine clear or pale yellow.  Avoid caffeine, tea, and carbonated beverages. They tend to irritate your bladder.  Empty your bladder often. Avoid holding urine for long periods of time.  Empty your bladder before and after sexual intercourse.  After a bowel movement, women should cleanse from front to back. Use each tissue only once. SEEK MEDICAL CARE IF:   You have back pain.  You develop a fever.  Your symptoms do not begin to resolve within 3 days. SEEK IMMEDIATE MEDICAL CARE IF:   You have severe back pain or lower abdominal pain.  You develop chills.  You have nausea or vomiting.  You have continued burning or discomfort with urination. MAKE SURE YOU:   Understand these instructions.  Will watch your condition.  Will get help right away if you are not doing well or get worse.   This information is not intended to replace advice given to you by your health care provider. Make sure you discuss any questions you have with your health care provider.   Document Released: 10/15/2004 Document Revised:  09/26/2014 Document Reviewed: 02/13/2011 Elsevier Interactive Patient Education [employer].

## 2015-02-06 DIAGNOSIS — Z8619 Personal history of other infectious and parasitic diseases: Secondary | ICD-10-CM | POA: Diagnosis not present

## 2015-02-06 DIAGNOSIS — N390 Urinary tract infection, site not specified: Secondary | ICD-10-CM | POA: Diagnosis not present

## 2015-02-06 NOTE — ED Notes (Signed)
AAOx3.  Skin warm and dry.  NAD 

## 2015-02-07 DIAGNOSIS — N390 Urinary tract infection, site not specified: Secondary | ICD-10-CM | POA: Diagnosis not present

## 2015-02-07 DIAGNOSIS — Z8619 Personal history of other infectious and parasitic diseases: Secondary | ICD-10-CM | POA: Diagnosis not present

## 2015-02-08 LAB — URINE CULTURE

## 2015-03-06 DIAGNOSIS — N529 Male erectile dysfunction, unspecified: Secondary | ICD-10-CM | POA: Diagnosis not present

## 2015-03-06 DIAGNOSIS — R0602 Shortness of breath: Secondary | ICD-10-CM | POA: Diagnosis not present

## 2015-03-06 DIAGNOSIS — N401 Enlarged prostate with lower urinary tract symptoms: Secondary | ICD-10-CM | POA: Diagnosis not present

## 2015-03-27 DIAGNOSIS — R0602 Shortness of breath: Secondary | ICD-10-CM | POA: Diagnosis not present

## 2015-04-04 DIAGNOSIS — R5383 Other fatigue: Secondary | ICD-10-CM | POA: Diagnosis not present

## 2015-04-04 DIAGNOSIS — F3289 Other specified depressive episodes: Secondary | ICD-10-CM | POA: Insufficient documentation

## 2015-04-04 DIAGNOSIS — N529 Male erectile dysfunction, unspecified: Secondary | ICD-10-CM | POA: Diagnosis not present

## 2015-04-04 DIAGNOSIS — Z954 Presence of other heart-valve replacement: Secondary | ICD-10-CM | POA: Diagnosis not present

## 2015-04-04 DIAGNOSIS — I359 Nonrheumatic aortic valve disorder, unspecified: Secondary | ICD-10-CM | POA: Diagnosis not present

## 2015-04-22 ENCOUNTER — Telehealth: Payer: Self-pay | Admitting: *Deleted

## 2015-04-22 ENCOUNTER — Encounter: Payer: PPO | Attending: Cardiovascular Disease | Admitting: *Deleted

## 2015-04-22 VITALS — BP 128/80 | HR 84 | Ht 72.9 in | Wt 158.2 lb

## 2015-04-22 DIAGNOSIS — Z952 Presence of prosthetic heart valve: Secondary | ICD-10-CM | POA: Diagnosis not present

## 2015-04-22 NOTE — Progress Notes (Signed)
Daily Session Note  Patient Details  Name: Daniel Kennedy Sr. MRN: [id number] Date of Birth: May 30, 1940 Referring Provider:  Sherren Mocha, MD  Encounter Date: 04/22/2015  Check In:     Session Check In - 04/22/15 1455    Check-In   Location [facility]   Staff Present Gerlene Burdock, RN, Moises Blood, BS, ACSM CEP, Exercise Physiologist   Supervising physician immediately available to respond to emergencies See telemetry face sheet for immediately available ER MD   Medication changes reported     No   Fall or balance concerns reported    No   Warm-up and Cool-down Not performed (comment)  6 minute walk    Resistance Training Performed No   VAD Patient? No   Pain Assessment   Currently in Pain? No/denies         Goals Met:  Personal goals reviewed  Goals Unmet:  Not Applicable  Comments:    Dr. Emily Filbert is Medical Director for [facility].

## 2015-04-22 NOTE — Patient Instructions (Signed)
Patient Instructions  Patient Details  Name: Daniel VENEMAN Sr. MRN: [id number] Date of Birth: 1940-12-07 Referring Provider:  Sherren Mocha, MD  Below are the personal goals you chose as well as exercise and nutrition goals. Our goal is to help you keep on track towards obtaining and maintaining your goals. We will be discussing your progress on these goals with you throughout the program.  Initial Exercise Prescription:     Initial Exercise Prescription - 04/22/15 1400    Date of Initial Exercise Prescription   Date 04/22/15   Treadmill   MPH 2.5   Grade 0   Minutes 15   Bike   Level 1   Minutes 15   Recumbant Bike   Level 3   Watts 30   Minutes 15   NuStep   Level 3   Watts 40   Minutes 15   Arm Ergometer   Level 1   Watts 10   Minutes 15   Arm/Foot Ergometer   Level 1   Watts 15   Minutes 15   Cybex   Level 2   RPM 40   Minutes 15   Recumbant Elliptical   Level 1   Watts 20   Minutes 15   Elliptical   Level 1   Speed 3   Minutes 5   REL-XR   Level 3   Watts 40   Minutes 15   T5 Nustep   Level 2   Watts 20   Minutes 15   Biostep-RELP   Level 3   Watts 30   Minutes 15   Prescription Details   Frequency (times per week) 3   Duration Progress to 30 minutes of continuous aerobic without signs/symptoms of physical distress   Intensity   THRR REST +  30   Ratings of Perceived Exertion 11-13   Perceived Dyspnea 0-4   Progression   Progression Continue progressive overload as per policy without signs/symptoms or physical distress.   Resistance Training   Training Prescription Yes   Weight 3   Reps 10-12      Exercise Goals: Frequency: Be able to perform aerobic exercise three times per week working toward 3-5 days per week.  Intensity: Work with a perceived exertion of 11 (fairly light) - 15 (hard) as tolerated. Follow your new exercise prescription and watch for changes in prescription as you progress with the program. Changes will be  reviewed with you when they are made.  Duration: You should be able to do 30 minutes of continuous aerobic exercise in addition to a 5 minute warm-up and a 5 minute cool-down routine.  Nutrition Goals: Your personal nutrition goals will be established when you do your nutrition analysis with the dietician.  The following are nutrition guidelines to follow: Cholesterol < 200mg /day Sodium < 1500mg /day Fiber: Men over 50 yrs - 30 grams per day  Personal Goals:     Personal Goals and Risk Factors at Admission - 04/22/15 1502    Core Components/Risk Factors/Patient Goals on Admission   Sedentary Yes   Intervention Provide advice, education, support and counseling about physical activity/exercise needs.;Develop an individualized exercise prescription for aerobic and resistive training based on initial evaluation findings, risk stratification, comorbidities and participant's personal goals.   Expected Outcomes Achievement of increased cardiorespiratory fitness and enhanced flexibility, muscular endurance and strength shown through measurements of functional capacity and personal statement of participant.   Increase Strength and Stamina Yes   Intervention Provide advice, education, support and  counseling about physical activity/exercise needs.;Develop an individualized exercise prescription for aerobic and resistive training based on initial evaluation findings, risk stratification, comorbidities and participant's personal goals.  "I really want to get back to be able to get back on the golf course.   Expected Outcomes Achievement of increased cardiorespiratory fitness and enhanced flexibility, muscular endurance and strength shown through measurements of functional capacity and personal statement of participant.   Tobacco Cessation Yes   Number of packs per day 0.25   Intervention Assist the participant in steps to quit. Provide individualized education and counseling about committing to Tobacco  Cessation, relapse prevention, and pharmacological support that can be provided by physician.;Advice worker, assist with locating and accessing local/national Quit Smoking programs, and support quit date choice.   Expected Outcomes Short Term: Will quit all tobacco product use, adhering to prevention of relapse plan.;Long Term: Complete abstinence from all tobacco products for at least 12 months from quit date.   Intervention Provide education, individualized exercise plan and daily activity instruction to help decrease symptoms of SOB with activities of daily living.   Expected Outcomes Short Term: Achieves a reduction of symptoms when performing activities of daily living.   Develop more efficient breathing techniques such as purse lipped breathing and diaphragmatic breathing; and practicing self-pacing with activity Yes      Tobacco Use Initial Evaluation: History  Smoking status  . Current Every Day Smoker -- 0.33 packs/day for 58 years  . Types: Cigarettes  Smokeless tobacco  . Not on file    Copy of goals given to participant.

## 2015-04-22 NOTE — Progress Notes (Signed)
Cardiac Individual Treatment Plan  Patient Details  Name: Daniel AMELIO Sr. MRN: [id number] Date of Birth: [birth year] Referring Provider:  Sherren Mocha, MD  Initial Encounter Date:       Cardiac Rehab from 04/22/2015 in [facility] Cardiac and Pulmonary Rehab   Date  04/22/15      Visit Diagnosis: S/P AVR (aortic valve replacement)  Patient's Home Medications on Admission:  Current outpatient prescriptions:  .  aspirin EC 81 MG EC tablet, Take 1 tablet (81 mg total) by mouth daily., Disp: , Rfl:  .  ferrous sulfate 325 (65 FE) MG tablet, Take 1 tablet by mouth daily., Disp: , Rfl:  .  metoprolol tartrate (LOPRESSOR) 25 MG tablet, Take 0.5 tablets (12.5 mg total) by mouth 2 (two) times daily., Disp: 31 tablet, Rfl: 1 .  cephALEXin (KEFLEX) 500 MG capsule, Take 1 capsule (500 mg total) by mouth 3 (three) times daily. (Patient not taking: Reported on 04/22/2015), Disp: 30 capsule, Rfl: 0 .  levocetirizine (XYZAL) 5 MG tablet, Take 5 mg by mouth daily., Disp: , Rfl:  .  montelukast (SINGULAIR) 10 MG tablet, Take 10 mg by mouth at bedtime., Disp: , Rfl:  .  Multiple Vitamin (MULTI-VITAMINS) TABS, Take 1 tablet by mouth daily., Disp: , Rfl:  .  omeprazole (PRILOSEC) 20 MG capsule, Take 20 mg by mouth daily., Disp: , Rfl:  .  tamsulosin (FLOMAX) 0.4 MG CAPS capsule, Take 0.4 mg by mouth daily., Disp: , Rfl:  .  vitamin C (ASCORBIC ACID) 500 MG tablet, Take 500 mg by mouth daily., Disp: , Rfl:  .  vitamin E (VITAMIN E) 400 UNIT capsule, Take 400 Units by mouth daily., Disp: , Rfl:   Past Medical History: Past Medical History  Diagnosis Date  . BPH (benign prostatic hyperplasia)   . Asthma   . Aortic stenosis     EF 50% echo 2010  . Heart murmur   . Shortness of breath dyspnea     WITH EXERTION   . GERD (gastroesophageal reflux disease)   . C. difficile colitis     Tobacco Use: History  Smoking status  . Current Every Day Smoker -- 0.33 packs/day for 58 years  . Types: Cigarettes   Smokeless tobacco  . Not on file    Labs: Recent Review Flowsheet Data    Labs for ITP Cardiac and Pulmonary Rehab Latest Ref Rng 11/08/2014 11/08/2014 11/08/2014 11/08/2014 11/08/2014   PHART 7.350 - 7.450 - 7.301(L) 7.317(L) 7.315(L) -   PCO2ART 35.0 - 45.0 mmHg - 54.1(H) 40.7 39.9 -   HCO3 20.0 - 24.0 mEq/L - 26.9(H) 21.0 20.5 -   TCO2 0 - 100 mmol/L 26 29 22 22 19    ACIDBASEDEF 0.0 - 2.0 mmol/L - 1.0 5.0(H) 6.0(H) -   O2SAT - - 99.0 97.0 98.0 -       Exercise Target Goals: Date: 04/22/15  Exercise Program Goal: Individual exercise prescription set with THRR, safety & activity barriers. Participant demonstrates ability to understand and report RPE using BORG scale, to self-measure pulse accurately, and to acknowledge the importance of the exercise prescription.  Exercise Prescription Goal: Starting with aerobic activity 30 plus minutes a day, 3 days per week for initial exercise prescription. Provide home exercise prescription and guidelines that participant acknowledges understanding prior to discharge.  Activity Barriers & Risk Stratification:     Activity Barriers & Cardiac Risk Stratification - 04/22/15 1452    Activity Barriers & Cardiac Risk Stratification   Activity Barriers Shortness  of Breath   Cardiac Risk Stratification High      6 Minute Walk:     6 Minute Walk      04/22/15 1418       6 Minute Walk   Phase Initial     Distance 1345 feet     Walk Time 6 minutes     # of Rest Breaks 0     RPE 9     Symptoms No     Resting HR 84 bpm     Resting BP 128/80 mmHg     Max Ex. HR 108 bpm     Max Ex. BP 130/60 mmHg        Initial Exercise Prescription:     Initial Exercise Prescription - 04/22/15 1400    Date of Initial Exercise Prescription   Date 04/22/15   Treadmill   MPH 2.5   Grade 0   Minutes 15   Bike   Level 1   Minutes 15   Recumbant Bike   Level 3   Watts 30   Minutes 15   NuStep   Level 3   Watts 40   Minutes 15   Arm  Ergometer   Level 1   Watts 10   Minutes 15   Arm/Foot Ergometer   Level 1   Watts 15   Minutes 15   Cybex   Level 2   RPM 40   Minutes 15   Recumbant Elliptical   Level 1   Watts 20   Minutes 15   Elliptical   Level 1   Speed 3   Minutes 5   REL-XR   Level 3   Watts 40   Minutes 15   T5 Nustep   Level 2   Watts 20   Minutes 15   Biostep-RELP   Level 3   Watts 30   Minutes 15   Prescription Details   Frequency (times per week) 3   Duration Progress to 30 minutes of continuous aerobic without signs/symptoms of physical distress   Intensity   THRR REST +  30   Ratings of Perceived Exertion 11-13   Perceived Dyspnea 0-4   Progression   Progression Continue progressive overload as per policy without signs/symptoms or physical distress.   Resistance Training   Training Prescription Yes   Weight 3   Reps 10-12      Perform Capillary Blood Glucose checks as needed.  Exercise Prescription Changes:   Exercise Comments:   Discharge Exercise Prescription (Final Exercise Prescription Changes):   Nutrition:  Target Goals: Understanding of nutrition guidelines, daily intake of sodium 1500mg , cholesterol 200mg , calories 30% from fat and 7% or less from saturated fats, daily to have 5 or more servings of fruits and vegetables.  Biometrics:     Pre Biometrics - 04/22/15 1424    Pre Biometrics   Height 6' 0.9" (1.852 m)   Weight 158 lb 3.2 oz (71.759 kg)   Waist Circumference 34.75 inches   Hip Circumference 40 inches   Waist to Hip Ratio 0.87 %   BMI (Calculated) 21       Nutrition Therapy Plan and Nutrition Goals:     Nutrition Therapy & Goals - 04/22/15 1501    Personal Nutrition Goals   Personal Goal #1 To cont to eat healthy "I feel like I eat healthy so I don't see the need to meet individually with the [facility] Registered Dietician.    Intervention Plan  Intervention Prescribe, educate and counsel regarding individualized specific dietary  modifications aiming towards targeted core components such as weight, hypertension, lipid management, diabetes, heart failure and other comorbidities.   Expected Outcomes Short Term Goal: Understand basic principles of dietary content, such as calories, fat, sodium, cholesterol and nutrients.;Long Term Goal: Adherence to prescribed nutrition plan.      Nutrition Discharge: Rate Your Plate Scores:   Nutrition Goals Re-Evaluation:   Psychosocial: Target Goals: Acknowledge presence or absence of depression, maximize coping skills, provide positive support system. Participant is able to verbalize types and ability to use techniques and skills needed for reducing stress and depression.  Initial Review & Psychosocial Screening:     Initial Psych Review & Screening - 04/22/15 1504    Family Dynamics   Good Support System? Yes   Barriers   Psychosocial barriers to participate in program There are no identifiable barriers or psychosocial needs.   Screening Interventions   Interventions Encouraged to exercise      Quality of Life Scores:   PHQ-9:     Recent Review Flowsheet Data    Depression screen [facility] 2/9 04/22/2015   Decreased Interest 1   Down, Depressed, Hopeless 0   PHQ - 2 Score 1   Altered sleeping 1   Tired, decreased energy 2   Change in appetite 0   Feeling bad or failure about yourself  0   Trouble concentrating 0   Moving slowly or fidgety/restless 0   Suicidal thoughts 0   PHQ-9 Score 4   Difficult doing work/chores Somewhat difficult      Psychosocial Evaluation and Intervention:   Psychosocial Re-Evaluation:   Vocational Rehabilitation: Provide vocational rehab assistance to qualifying candidates.   Vocational Rehab Evaluation & Intervention:     Vocational Rehab - 04/22/15 1455    Initial Vocational Rehab Evaluation & Intervention   Assessment shows need for Vocational Rehabilitation No      Education: Education Goals: Education classes will  be provided on a weekly basis, covering required topics. Participant will state understanding/return demonstration of topics presented.  Learning Barriers/Preferences:     Learning Barriers/Preferences - 04/22/15 1453    Learning Barriers/Preferences   Learning Barriers Hearing   Learning Preferences None      Education Topics: General Nutrition Guidelines/Fats and Fiber: -Group instruction provided by verbal, written material, models and posters to present the general guidelines for heart healthy nutrition. Gives an explanation and review of dietary fats and fiber.   Controlling Sodium/Reading Food Labels: -Group verbal and written material supporting the discussion of sodium use in heart healthy nutrition. Review and explanation with models, verbal and written materials for utilization of the food label.   Exercise Physiology & Risk Factors: - Group verbal and written instruction with models to review the exercise physiology of the cardiovascular system and associated critical values. Details cardiovascular disease risk factors and the goals associated with each risk factor.   Aerobic Exercise & Resistance Training: - Gives group verbal and written discussion on the health impact of inactivity. On the components of aerobic and resistive training programs and the benefits of this training and how to safely progress through these programs.   Flexibility, Balance, General Exercise Guidelines: - Provides group verbal and written instruction on the benefits of flexibility and balance training programs. Provides general exercise guidelines with specific guidelines to those with heart or lung disease. Demonstration and skill practice provided.   Stress Management: - Provides group verbal and written instruction about the health  risks of elevated stress, cause of high stress, and healthy ways to reduce stress.   Depression: - Provides group verbal and written instruction on the  correlation between heart/lung disease and depressed mood, treatment options, and the stigmas associated with seeking treatment.   Anatomy & Physiology of the Heart: - Group verbal and written instruction and models provide basic cardiac anatomy and physiology, with the coronary electrical and arterial systems. Review of: AMI, Angina, Valve disease, Heart Failure, Cardiac Arrhythmia, Pacemakers, and the ICD.   Cardiac Procedures: - Group verbal and written instruction and models to describe the testing methods done to diagnose heart disease. Reviews the outcomes of the test results. Describes the treatment choices: Medical Management, Angioplasty, or Coronary Bypass Surgery.   Cardiac Medications: - Group verbal and written instruction to review commonly prescribed medications for heart disease. Reviews the medication, class of the drug, and side effects. Includes the steps to properly store meds and maintain the prescription regimen.   Go Sex-Intimacy & Heart Disease, Get SMART - Goal Setting: - Group verbal and written instruction through game format to discuss heart disease and the return to sexual intimacy. Provides group verbal and written material to discuss and apply goal setting through the application of the S.M.A.R.T. Method.   Other Matters of the Heart: - Provides group verbal, written materials and models to describe Heart Failure, Angina, Valve Disease, and Diabetes in the realm of heart disease. Includes description of the disease process and treatment options available to the cardiac patient.   Exercise & Equipment Safety: - Individual verbal instruction and demonstration of equipment use and safety with use of the equipment.          Cardiac Rehab from 04/22/2015 in [facility] Cardiac [facility] Pulmonary Rehab   Date  04/22/15   Educator  C. EnterkinRN   Instruction Review Code  1- partially meets, needs review/practice      Infection Prevention: - Provides verbal and written  material to individual with discussion of infection control including proper hand washing and proper equipment cleaning during exercise session.      Cardiac Rehab from 04/22/2015 in [facility] Cardiac and Pulmonary Rehab   Date  04/22/15   Educator  C. EnterkinRN   Instruction Review Code  2- meets goals/outcomes      Falls Prevention: - Provides verbal and written material to individual with discussion of falls prevention and safety.      Cardiac Rehab from 04/22/2015 in [facility] Cardiac and Pulmonary Rehab   Date  04/22/15   Educator  C. Nescatunga   Instruction Review Code  2- meets goals/outcomes      Diabetes: - Individual verbal and written instruction to review signs/symptoms of diabetes, desired ranges of glucose level fasting, after meals and with exercise. Advice that pre and post exercise glucose checks will be done for 3 sessions at entry of program.    Knowledge Questionnaire Score:   Core Components/Risk Factors/Patient Goals at Admission:     Personal Goals and Risk Factors at Admission - 04/22/15 1502    Core Components/Risk Factors/Patient Goals on Admission   Sedentary Yes   Intervention Provide advice, education, support and counseling about physical activity/exercise needs.;Develop an individualized exercise prescription for aerobic and resistive training based on initial evaluation findings, risk stratification, comorbidities and participant's personal goals.   Expected Outcomes Achievement of increased cardiorespiratory fitness and enhanced flexibility, muscular endurance and strength shown through measurements of functional capacity and personal statement of participant.   Increase Strength and  Stamina Yes   Intervention Provide advice, education, support and counseling about physical activity/exercise needs.;Develop an individualized exercise prescription for aerobic and resistive training based on initial evaluation findings, risk stratification, comorbidities and  participant's personal goals.  "I really want to get back to be able to get back on the golf course.   Expected Outcomes Achievement of increased cardiorespiratory fitness and enhanced flexibility, muscular endurance and strength shown through measurements of functional capacity and personal statement of participant.   Tobacco Cessation Yes   Number of packs per day 0.25   Intervention Assist the participant in steps to quit. Provide individualized education and counseling about committing to Tobacco Cessation, relapse prevention, and pharmacological support that can be provided by physician.;Advice worker, assist with locating and accessing local/national Quit Smoking programs, and support quit date choice.   Expected Outcomes Short Term: Will quit all tobacco product use, adhering to prevention of relapse plan.;Long Term: Complete abstinence from all tobacco products for at least 12 months from quit date.   Intervention Provide education, individualized exercise plan and daily activity instruction to help decrease symptoms of SOB with activities of daily living.   Expected Outcomes Short Term: Achieves a reduction of symptoms when performing activities of daily living.   Develop more efficient breathing techniques such as purse lipped breathing and diaphragmatic breathing; and practicing self-pacing with activity Yes      Core Components/Risk Factors/Patient Goals Review:    Core Components/Risk Factors/Patient Goals at Discharge (Final Review):    ITP Comments:   Comments: Medical review done. Daniel Kennedy is anxious to start so he can get back on the golf course and have the energy for that.

## 2015-04-25 DIAGNOSIS — F17200 Nicotine dependence, unspecified, uncomplicated: Secondary | ICD-10-CM | POA: Diagnosis not present

## 2015-04-25 DIAGNOSIS — Z952 Presence of prosthetic heart valve: Secondary | ICD-10-CM | POA: Diagnosis not present

## 2015-04-25 DIAGNOSIS — N529 Male erectile dysfunction, unspecified: Secondary | ICD-10-CM | POA: Diagnosis not present

## 2015-04-25 DIAGNOSIS — Z23 Encounter for immunization: Secondary | ICD-10-CM | POA: Diagnosis not present

## 2015-04-25 DIAGNOSIS — N401 Enlarged prostate with lower urinary tract symptoms: Secondary | ICD-10-CM | POA: Diagnosis not present

## 2015-04-26 ENCOUNTER — Encounter: Payer: PPO | Admitting: *Deleted

## 2015-04-26 DIAGNOSIS — Z952 Presence of prosthetic heart valve: Secondary | ICD-10-CM

## 2015-04-26 NOTE — Progress Notes (Signed)
Daily Session Note  Patient Details  Name: Daniel SHAPPELL Sr. MRN: [id number] Date of Birth: April 25, 1940 Referring Provider:  Sherren Mocha, MD  Encounter Date: 04/26/2015  Check In:     Session Check In - 04/26/15 1008    Check-In   Location [facility]-Cardiac & Pulmonary Rehab   Staff Present Heath Lark, RN, BSN, CCRP;Monic Engelmann, RN, Michaela Corner, RRT, RCP, Respiratory Therapist   Supervising physician immediately available to respond to emergencies See telemetry face sheet for immediately available ER MD   Medication changes reported     No   Fall or balance concerns reported    No   Warm-up and Cool-down Performed on first and last piece of equipment   Resistance Training Performed Yes   VAD Patient? No   Pain Assessment   Currently in Pain? No/denies         Goals Met:  Proper associated with RPD/PD & O2 Sat Exercise tolerated well No report of cardiac concerns or symptoms  Goals Unmet:  Not Applicable  Comments:    Dr. Emily Filbert is Medical Director for [facility].

## 2015-04-26 NOTE — Progress Notes (Signed)
Daily Session Note  Patient Details  Name: Daniel SALIDO Sr. MRN: [id number] Date of Birth: 1940/09/19 Referring Provider:  Sherren Mocha, MD  Encounter Date: 04/26/2015  Check In:     Session Check In - 04/26/15 0924    Check-In   Location [facility]-Cardiac & Pulmonary Rehab   Staff Present Heath Lark, RN, BSN, CCRP;Amanada Philbrick, RN, Michaela Corner, RRT, RCP, Respiratory Therapist   Supervising physician immediately available to respond to emergencies See telemetry face sheet for immediately available ER MD   Medication changes reported     No   Fall or balance concerns reported    No   Warm-up and Cool-down Performed on first and last piece of equipment   Resistance Training Performed Yes   VAD Patient? No   Pain Assessment   Currently in Pain? No/denies         Goals Met:  Proper associated with RPD/PD & O2 Sat Exercise tolerated well  Goals Unmet:  Not Applicable  Comments:    Dr. Emily Filbert is Medical Director for [place] [facility].

## 2015-04-26 NOTE — Progress Notes (Signed)
Cardiac Individual Treatment Plan  Patient Details  Name: Daniel BROADEN Sr. MRN: [id number] Date of Birth: Mar 01, 1940 Referring Provider:  Sherren Mocha, MD  Initial Encounter Date:       Cardiac Rehab from 04/22/2015 in [facility] Cardiac and Pulmonary Rehab   Date  04/22/15      Visit Diagnosis: S/P AVR (aortic valve replacement)  Patient's Home Medications on Admission:  Current outpatient prescriptions:  .  aspirin EC 81 MG EC tablet, Take 1 tablet (81 mg total) by mouth daily., Disp: , Rfl:  .  cephALEXin (KEFLEX) 500 MG capsule, Take 1 capsule (500 mg total) by mouth 3 (three) times daily. (Patient not taking: Reported on 04/22/2015), Disp: 30 capsule, Rfl: 0 .  ferrous sulfate 325 (65 FE) MG tablet, Take 1 tablet by mouth daily., Disp: , Rfl:  .  levocetirizine (XYZAL) 5 MG tablet, Take 5 mg by mouth daily., Disp: , Rfl:  .  metoprolol tartrate (LOPRESSOR) 25 MG tablet, Take 0.5 tablets (12.5 mg total) by mouth 2 (two) times daily., Disp: 31 tablet, Rfl: 1 .  montelukast (SINGULAIR) 10 MG tablet, Take 10 mg by mouth at bedtime., Disp: , Rfl:  .  Multiple Vitamin (MULTI-VITAMINS) TABS, Take 1 tablet by mouth daily., Disp: , Rfl:  .  omeprazole (PRILOSEC) 20 MG capsule, Take 20 mg by mouth daily., Disp: , Rfl:  .  tamsulosin (FLOMAX) 0.4 MG CAPS capsule, Take 0.4 mg by mouth daily., Disp: , Rfl:  .  vitamin C (ASCORBIC ACID) 500 MG tablet, Take 500 mg by mouth daily., Disp: , Rfl:  .  vitamin E (VITAMIN E) 400 UNIT capsule, Take 400 Units by mouth daily., Disp: , Rfl:   Past Medical History: Past Medical History  Diagnosis Date  . BPH (benign prostatic hyperplasia)   . Asthma   . Aortic stenosis     EF 50% echo 2010  . Heart murmur   . Shortness of breath dyspnea     WITH EXERTION   . GERD (gastroesophageal reflux disease)   . C. difficile colitis     Tobacco Use: History  Smoking status  . Current Every Day Smoker -- 0.33 packs/day for 58 years  . Types: Cigarettes   Smokeless tobacco  . Not on file    Labs: Recent Review Flowsheet Data    Labs for ITP Cardiac and Pulmonary Rehab Latest Ref Rng 11/08/2014 11/08/2014 11/08/2014 11/08/2014 11/08/2014   PHART 7.350 - 7.450 - 7.301(L) 7.317(L) 7.315(L) -   PCO2ART 35.0 - 45.0 mmHg - 54.1(H) 40.7 39.9 -   HCO3 20.0 - 24.0 mEq/L - 26.9(H) 21.0 20.5 -   TCO2 0 - 100 mmol/L 26 29 22 22 19    ACIDBASEDEF 0.0 - 2.0 mmol/L - 1.0 5.0(H) 6.0(H) -   O2SAT - - 99.0 97.0 98.0 -       Exercise Target Goals:    Exercise Program Goal: Individual exercise prescription set with THRR, safety & activity barriers. Participant demonstrates ability to understand and report RPE using BORG scale, to self-measure pulse accurately, and to acknowledge the importance of the exercise prescription.  Exercise Prescription Goal: Starting with aerobic activity 30 plus minutes a day, 3 days per week for initial exercise prescription. Provide home exercise prescription and guidelines that participant acknowledges understanding prior to discharge.  Activity Barriers & Risk Stratification:     Activity Barriers & Cardiac Risk Stratification - 04/22/15 1452    Activity Barriers & Cardiac Risk Stratification   Activity Barriers Shortness  of Breath   Cardiac Risk Stratification High      6 Minute Walk:     6 Minute Walk      04/22/15 1418       6 Minute Walk   Phase Initial     Distance 1345 feet     Walk Time 6 minutes     # of Rest Breaks 0     RPE 9     Symptoms No     Resting HR 84 bpm     Resting BP 128/80 mmHg     Max Ex. HR 108 bpm     Max Ex. BP 130/60 mmHg        Initial Exercise Prescription:     Initial Exercise Prescription - 04/22/15 1400    Date of Initial Exercise Prescription   Date 04/22/15   Treadmill   MPH 2.5   Grade 0   Minutes 15   Bike   Level 1   Minutes 15   Recumbant Bike   Level 3   Watts 30   Minutes 15   NuStep   Level 3   Watts 40   Minutes 15   Arm Ergometer    Level 1   Watts 10   Minutes 15   Arm/Foot Ergometer   Level 1   Watts 15   Minutes 15   Cybex   Level 2   RPM 40   Minutes 15   Recumbant Elliptical   Level 1   Watts 20   Minutes 15   Elliptical   Level 1   Speed 3   Minutes 5   REL-XR   Level 3   Watts 40   Minutes 15   T5 Nustep   Level 2   Watts 20   Minutes 15   Biostep-RELP   Level 3   Watts 30   Minutes 15   Prescription Details   Frequency (times per week) 3   Duration Progress to 30 minutes of continuous aerobic without signs/symptoms of physical distress   Intensity   THRR REST +  30   Ratings of Perceived Exertion 11-13   Perceived Dyspnea 0-4   Progression   Progression Continue progressive overload as per policy without signs/symptoms or physical distress.   Resistance Training   Training Prescription Yes   Weight 3   Reps 10-12      Perform Capillary Blood Glucose checks as needed.  Exercise Prescription Changes:   Exercise Comments:   Discharge Exercise Prescription (Final Exercise Prescription Changes):   Nutrition:  Target Goals: Understanding of nutrition guidelines, daily intake of sodium 1500mg , cholesterol 200mg , calories 30% from fat and 7% or less from saturated fats, daily to have 5 or more servings of fruits and vegetables.  Biometrics:     Pre Biometrics - 04/22/15 1424    Pre Biometrics   Height 6' 0.9" (1.852 m)   Weight 158 lb 3.2 oz (71.759 kg)   Waist Circumference 34.75 inches   Hip Circumference 40 inches   Waist to Hip Ratio 0.87 %   BMI (Calculated) 21       Nutrition Therapy Plan and Nutrition Goals:     Nutrition Therapy & Goals - 04/22/15 1501    Personal Nutrition Goals   Personal Goal #1 To cont to eat healthy "I feel like I eat healthy so I don't see the need to meet individually with the [facility] Registered Dietician.    Intervention Plan  Intervention Prescribe, educate and counsel regarding individualized specific dietary modifications aiming  towards targeted core components such as weight, hypertension, lipid management, diabetes, heart failure and other comorbidities.   Expected Outcomes Short Term Goal: Understand basic principles of dietary content, such as calories, fat, sodium, cholesterol and nutrients.;Long Term Goal: Adherence to prescribed nutrition plan.      Nutrition Discharge: Rate Your Plate Scores:   Nutrition Goals Re-Evaluation:   Psychosocial: Target Goals: Acknowledge presence or absence of depression, maximize coping skills, provide positive support system. Participant is able to verbalize types and ability to use techniques and skills needed for reducing stress and depression.  Initial Review & Psychosocial Screening:     Initial Psych Review & Screening - 04/22/15 1504    Family Dynamics   Good Support System? Yes   Barriers   Psychosocial barriers to participate in program There are no identifiable barriers or psychosocial needs.   Screening Interventions   Interventions Encouraged to exercise      Quality of Life Scores:   PHQ-9:     Recent Review Flowsheet Data    Depression screen [facility] 2/9 04/22/2015   Decreased Interest 1   Down, Depressed, Hopeless 0   PHQ - 2 Score 1   Altered sleeping 1   Tired, decreased energy 2   Change in appetite 0   Feeling bad or failure about yourself  0   Trouble concentrating 0   Moving slowly or fidgety/restless 0   Suicidal thoughts 0   PHQ-9 Score 4   Difficult doing work/chores Somewhat difficult      Psychosocial Evaluation and Intervention:   Psychosocial Re-Evaluation:   Vocational Rehabilitation: Provide vocational rehab assistance to qualifying candidates.   Vocational Rehab Evaluation & Intervention:     Vocational Rehab - 04/22/15 1455    Initial Vocational Rehab Evaluation & Intervention   Assessment shows need for Vocational Rehabilitation No      Education: Education Goals: Education classes will be provided on a  weekly basis, covering required topics. Participant will state understanding/return demonstration of topics presented.  Learning Barriers/Preferences:     Learning Barriers/Preferences - 04/22/15 1453    Learning Barriers/Preferences   Learning Barriers Hearing   Learning Preferences None      Education Topics: General Nutrition Guidelines/Fats and Fiber: -Group instruction provided by verbal, written material, models and posters to present the general guidelines for heart healthy nutrition. Gives an explanation and review of dietary fats and fiber.   Controlling Sodium/Reading Food Labels: -Group verbal and written material supporting the discussion of sodium use in heart healthy nutrition. Review and explanation with models, verbal and written materials for utilization of the food label.   Exercise Physiology & Risk Factors: - Group verbal and written instruction with models to review the exercise physiology of the cardiovascular system and associated critical values. Details cardiovascular disease risk factors and the goals associated with each risk factor.   Aerobic Exercise & Resistance Training: - Gives group verbal and written discussion on the health impact of inactivity. On the components of aerobic and resistive training programs and the benefits of this training and how to safely progress through these programs.   Flexibility, Balance, General Exercise Guidelines: - Provides group verbal and written instruction on the benefits of flexibility and balance training programs. Provides general exercise guidelines with specific guidelines to those with heart or lung disease. Demonstration and skill practice provided.   Stress Management: - Provides group verbal and written instruction about the health  risks of elevated stress, cause of high stress, and healthy ways to reduce stress.   Depression: - Provides group verbal and written instruction on the correlation between  heart/lung disease and depressed mood, treatment options, and the stigmas associated with seeking treatment.   Anatomy & Physiology of the Heart: - Group verbal and written instruction and models provide basic cardiac anatomy and physiology, with the coronary electrical and arterial systems. Review of: AMI, Angina, Valve disease, Heart Failure, Cardiac Arrhythmia, Pacemakers, and the ICD.   Cardiac Procedures: - Group verbal and written instruction and models to describe the testing methods done to diagnose heart disease. Reviews the outcomes of the test results. Describes the treatment choices: Medical Management, Angioplasty, or Coronary Bypass Surgery.   Cardiac Medications: - Group verbal and written instruction to review commonly prescribed medications for heart disease. Reviews the medication, class of the drug, and side effects. Includes the steps to properly store meds and maintain the prescription regimen.   Go Sex-Intimacy & Heart Disease, Get SMART - Goal Setting: - Group verbal and written instruction through game format to discuss heart disease and the return to sexual intimacy. Provides group verbal and written material to discuss and apply goal setting through the application of the S.M.A.R.T. Method.   Other Matters of the Heart: - Provides group verbal, written materials and models to describe Heart Failure, Angina, Valve Disease, and Diabetes in the realm of heart disease. Includes description of the disease process and treatment options available to the cardiac patient.   Exercise & Equipment Safety: - Individual verbal instruction and demonstration of equipment use and safety with use of the equipment.          Cardiac Rehab from 04/26/2015 in [facility] Cardiac and Pulmonary Rehab   Date  04/26/15   Educator  C. EnterkinRN   Instruction Review Code  1- partially meets, needs review/practice [Ennio almost fell on the treadmill so we got him on the [place]]      Infection  Prevention: - Provides verbal and written material to individual with discussion of infection control including proper hand washing and proper equipment cleaning during exercise session.      Cardiac Rehab from 04/22/2015 in [facility] Cardiac and Pulmonary Rehab   Date  04/22/15   Educator  C. EnterkinRN   Instruction Review Code  2- meets goals/outcomes      Falls Prevention: - Provides verbal and written material to individual with discussion of falls prevention and safety.      Cardiac Rehab from 04/22/2015 in [facility] Cardiac and Pulmonary Rehab   Date  04/22/15   Educator  C. [place]   Instruction Review Code  2- meets goals/outcomes      Diabetes: - Individual verbal and written instruction to review signs/symptoms of diabetes, desired ranges of glucose level fasting, after meals and with exercise. Advice that pre and post exercise glucose checks will be done for 3 sessions at entry of program.    Knowledge Questionnaire Score:   Core Components/Risk Factors/Patient Goals at Admission:     Personal Goals and Risk Factors at Admission - 04/22/15 1502    Core Components/Risk Factors/Patient Goals on Admission   Sedentary Yes   Intervention Provide advice, education, support and counseling about physical activity/exercise needs.;Develop an individualized exercise prescription for aerobic and resistive training based on initial evaluation findings, risk stratification, comorbidities and participant's personal goals.   Expected Outcomes Achievement of increased cardiorespiratory fitness and enhanced flexibility, muscular endurance and strength shown through measurements  of functional capacity and personal statement of participant.   Increase Strength and Stamina Yes   Intervention Provide advice, education, support and counseling about physical activity/exercise needs.;Develop an individualized exercise prescription for aerobic and resistive training based on initial evaluation findings,  risk stratification, comorbidities and participant's personal goals.  "I really want to get back to be able to get back on the golf course.   Expected Outcomes Achievement of increased cardiorespiratory fitness and enhanced flexibility, muscular endurance and strength shown through measurements of functional capacity and personal statement of participant.   Tobacco Cessation Yes   Number of packs per day 0.25   Intervention Assist the participant in steps to quit. Provide individualized education and counseling about committing to Tobacco Cessation, relapse prevention, and pharmacological support that can be provided by physician.;Advice worker, assist with locating and accessing local/national Quit Smoking programs, and support quit date choice.   Expected Outcomes Short Term: Will quit all tobacco product use, adhering to prevention of relapse plan.;Long Term: Complete abstinence from all tobacco products for at least 12 months from quit date.   Intervention Provide education, individualized exercise plan and daily activity instruction to help decrease symptoms of SOB with activities of daily living.   Expected Outcomes Short Term: Achieves a reduction of symptoms when performing activities of daily living.   Develop more efficient breathing techniques such as purse lipped breathing and diaphragmatic breathing; and practicing self-pacing with activity Yes      Core Components/Risk Factors/Patient Goals Review:      Goals and Risk Factor Review      04/26/15 0927           Core Components/Risk Factors/Patient Goals Review   Personal Goals Review Sedentary       Review Jaydian said he wanted to do more today but we instructed him first day to keep Rate of Perceived exertion at 11-12.        Expected Outcomes Goal to increase his exercise ability.           Core Components/Risk Factors/Patient Goals at Discharge (Final Review):      Goals and Risk Factor Review - 04/26/15  0927    Core Components/Risk Factors/Patient Goals Review   Personal Goals Review Sedentary   Review Kamerin said he wanted to do more today but we instructed him first day to keep Rate of Perceived exertion at 11-12.    Expected Outcomes Goal to increase his exercise ability.       ITP Comments:     ITP Comments      04/26/15 0926           ITP Comments Welcome's first day and he admitted that he has never been on a treadmill before. He almost fell so we changed him to Nustep for now. After class I got on a treadmill and explained it to him.           Comments:

## 2015-04-29 ENCOUNTER — Encounter: Payer: PPO | Admitting: *Deleted

## 2015-04-29 DIAGNOSIS — Z952 Presence of prosthetic heart valve: Secondary | ICD-10-CM

## 2015-04-29 NOTE — Progress Notes (Signed)
Daily Session Note  Patient Details  Name: MONTEY EBEL Sr. MRN: [id number] Date of Birth: 05/17/1940 Referring Provider:  Sherren Mocha, MD  Encounter Date: 04/29/2015  Check In:     Session Check In - 04/29/15 0843    Check-In   Location [facility]-Cardiac & Pulmonary Rehab   Staff Present Nyoka Cowden, RN;Fredrica Capano, RN, Moises Blood, BS, ACSM CEP, Exercise Physiologist   Supervising physician immediately available to respond to emergencies See telemetry face sheet for immediately available ER MD   Medication changes reported     No   Fall or balance concerns reported    No   Warm-up and Cool-down Performed on first and last piece of equipment   Resistance Training Performed Yes   VAD Patient? No   Pain Assessment   Currently in Pain? No/denies         Goals Met:  Proper associated with RPD/PD & O2 Sat Exercise tolerated well  Goals Unmet:  Not Applicable  Comments:    Dr. Emily Filbert is Medical Director for [place] and [facility].

## 2015-05-02 DIAGNOSIS — R5383 Other fatigue: Secondary | ICD-10-CM | POA: Diagnosis not present

## 2015-05-02 DIAGNOSIS — I359 Nonrheumatic aortic valve disorder, unspecified: Secondary | ICD-10-CM | POA: Diagnosis not present

## 2015-05-02 DIAGNOSIS — F17200 Nicotine dependence, unspecified, uncomplicated: Secondary | ICD-10-CM | POA: Diagnosis not present

## 2015-05-02 DIAGNOSIS — R39198 Other difficulties with micturition: Secondary | ICD-10-CM | POA: Diagnosis not present

## 2015-05-02 DIAGNOSIS — N401 Enlarged prostate with lower urinary tract symptoms: Secondary | ICD-10-CM | POA: Diagnosis not present

## 2015-05-02 DIAGNOSIS — J302 Other seasonal allergic rhinitis: Secondary | ICD-10-CM | POA: Diagnosis not present

## 2015-05-03 ENCOUNTER — Encounter: Payer: PPO | Admitting: *Deleted

## 2015-05-03 DIAGNOSIS — Z952 Presence of prosthetic heart valve: Secondary | ICD-10-CM | POA: Diagnosis not present

## 2015-05-03 NOTE — Progress Notes (Signed)
Daily Session Note  Patient Details  Name: AIDENN SKELLENGER Sr. MRN: [id number] Date of Birth: Mar 02, 1940 Referring Provider:    Encounter Date: 05/03/2015  Check In:     Session Check In - 05/03/15 0858    Check-In   Staff Present Heath Lark, RN, BSN, CCRP;Carroll Enterkin, RN, Alex Gardener, DPT, CEEA   Supervising physician immediately available to respond to emergencies See telemetry face sheet for immediately available ER MD   Medication changes reported     No   Fall or balance concerns reported    No   Warm-up and Cool-down Performed on first and last piece of equipment   VAD Patient? No   Pain Assessment   Currently in Pain? No/denies         Goals Met:  Exercise tolerated well Personal goals reviewed No report of cardiac concerns or symptoms Strength training completed today  Goals Unmet:  Not Applicable  Comments: Doing well with exercise prescription progression.    Dr. Emily Filbert is Medical Director for [place] and [facility].

## 2015-05-06 ENCOUNTER — Encounter: Payer: PPO | Admitting: *Deleted

## 2015-05-06 DIAGNOSIS — Z952 Presence of prosthetic heart valve: Secondary | ICD-10-CM

## 2015-05-06 NOTE — Progress Notes (Signed)
Daily Session Note  Patient Details  Name: Daniel AHART Sr. MRN: [id number] Date of Birth: [birth year] Referring Provider:    Encounter Date: 05/06/2015  Check In:     Session Check In - 05/06/15 0843    Check-In   Location [facility]-Cardiac & Pulmonary Rehab   Staff Present Heath Lark, RN, BSN, Laveda Norman, BS, ACSM CEP, Exercise Physiologist;Carroll Enterkin, RN, BSN   Supervising physician immediately available to respond to emergencies See telemetry face sheet for immediately available ER MD   Medication changes reported     No   Fall or balance concerns reported    No   Warm-up and Cool-down Performed on first and last piece of equipment   Resistance Training Performed Yes   VAD Patient? No   Pain Assessment   Currently in Pain? No/denies   Multiple Pain Sites No         Goals Met:  Independence with exercise equipment Exercise tolerated well No report of cardiac concerns or symptoms Strength training completed today  Goals Unmet:  Not Applicable  Comments: Patient completed exercise prescription and all exercise goals during rehab session. The exercise was tolerated well and the patient is progressing in the program.     Dr. Emily Filbert is Medical Director for [place] and [facility].

## 2015-05-10 ENCOUNTER — Encounter: Payer: PPO | Admitting: *Deleted

## 2015-05-10 DIAGNOSIS — Z952 Presence of prosthetic heart valve: Secondary | ICD-10-CM | POA: Diagnosis not present

## 2015-05-10 NOTE — Progress Notes (Signed)
Daily Session Note  Patient Details  Name: Daniel ENRIQUEZ Sr. MRN: [id number] Date of Birth: [birth year] Referring Provider:    Encounter Date: 05/10/2015  Check In:     Session Check In - 05/10/15 0911    Check-In   Staff Present Heath Lark, RN, BSN, CCRP;Carroll Enterkin, RN, Alex Gardener, DPT, CEEA   Supervising physician immediately available to respond to emergencies See telemetry face sheet for immediately available ER MD   Medication changes reported     No   Fall or balance concerns reported    No   Warm-up and Cool-down Performed on first and last piece of equipment   VAD Patient? No   Pain Assessment   Currently in Pain? No/denies         Goals Met:  Exercise tolerated well No report of cardiac concerns or symptoms Strength training completed today  Goals Unmet:  Not Applicable  Comments: Doing well with exercise prescription progression.    Dr. Emily Filbert is Medical Director for [place] and [facility].

## 2015-05-12 ENCOUNTER — Encounter: Payer: Self-pay | Admitting: *Deleted

## 2015-05-12 DIAGNOSIS — Z952 Presence of prosthetic heart valve: Secondary | ICD-10-CM

## 2015-05-12 NOTE — Progress Notes (Signed)
Cardiac Individual Treatment Plan  Patient Details  Name: Daniel TINOCO Sr. MRN: [id number] Date of Birth: [birth year] Referring Provider:    Initial Encounter Date:       Cardiac Rehab from 04/22/2015 in [facility] Cardiac and Pulmonary Rehab   Date  04/22/15      Visit Diagnosis: S/P AVR (aortic valve replacement)  Patient's Home Medications on Admission:  Current outpatient prescriptions:  .  aspirin EC 81 MG EC tablet, Take 1 tablet (81 mg total) by mouth daily., Disp: , Rfl:  .  cephALEXin (KEFLEX) 500 MG capsule, Take 1 capsule (500 mg total) by mouth 3 (three) times daily. (Patient not taking: Reported on 04/22/2015), Disp: 30 capsule, Rfl: 0 .  ferrous sulfate 325 (65 FE) MG tablet, Take 1 tablet by mouth daily., Disp: , Rfl:  .  levocetirizine (XYZAL) 5 MG tablet, Take 5 mg by mouth daily., Disp: , Rfl:  .  metoprolol tartrate (LOPRESSOR) 25 MG tablet, Take 0.5 tablets (12.5 mg total) by mouth 2 (two) times daily., Disp: 31 tablet, Rfl: 1 .  montelukast (SINGULAIR) 10 MG tablet, Take 10 mg by mouth at bedtime., Disp: , Rfl:  .  Multiple Vitamin (MULTI-VITAMINS) TABS, Take 1 tablet by mouth daily., Disp: , Rfl:  .  omeprazole (PRILOSEC) 20 MG capsule, Take 20 mg by mouth daily., Disp: , Rfl:  .  tamsulosin (FLOMAX) 0.4 MG CAPS capsule, Take 0.4 mg by mouth daily., Disp: , Rfl:  .  vitamin C (ASCORBIC ACID) 500 MG tablet, Take 500 mg by mouth daily., Disp: , Rfl:  .  vitamin E (VITAMIN E) 400 UNIT capsule, Take 400 Units by mouth daily., Disp: , Rfl:   Past Medical History: Past Medical History  Diagnosis Date  . BPH (benign prostatic hyperplasia)   . Asthma   . Aortic stenosis     EF 50% echo 2010  . Heart murmur   . Shortness of breath dyspnea     WITH EXERTION   . GERD (gastroesophageal reflux disease)   . C. difficile colitis     Tobacco Use: History  Smoking status  . Current Every Day Smoker -- 0.33 packs/day for 58 years  . Types: Cigarettes  Smokeless tobacco  .  Not on file    Labs: Recent Review Flowsheet Data    Labs for ITP Cardiac and Pulmonary Rehab Latest Ref Rng 11/08/2014 11/08/2014 11/08/2014 11/08/2014 11/08/2014   PHART 7.350 - 7.450 - 7.301(L) 7.317(L) 7.315(L) -   PCO2ART 35.0 - 45.0 mmHg - 54.1(H) 40.7 39.9 -   HCO3 20.0 - 24.0 mEq/L - 26.9(H) 21.0 20.5 -   TCO2 0 - 100 mmol/L _0 ACIDBASEDEF 0.0 - 2.0 mmol/L - 1.0 5.0(H) 6.0(H) -   O2SAT - - 99.0 97.0 98.0 -       Exercise Target Goals:    Exercise Program Goal: Individual exercise prescription set with THRR, safety & activity barriers. Participant demonstrates ability to understand and report RPE using BORG scale, to self-measure pulse accurately, and to acknowledge the importance of the exercise prescription.  Exercise Prescription Goal: Starting with aerobic activity 30 plus minutes a day, 3 days per week for initial exercise prescription. Provide home exercise prescription and guidelines that participant acknowledges understanding prior to discharge.  Activity Barriers & Risk Stratification:     Activity Barriers & Cardiac Risk Stratification - 04/22/15 1452    Activity Barriers & Cardiac Risk Stratification   Activity Barriers Shortness of Breath  Cardiac Risk Stratification High      6 Minute Walk:     6 Minute Walk      04/22/15 1418       6 Minute Walk   Phase Initial     Distance 1345 feet     Walk Time 6 minutes     # of Rest Breaks 0     RPE 9     Symptoms No     Resting HR 84 bpm     Resting BP 128/80 mmHg     Max Ex. HR 108 bpm     Max Ex. BP 130/60 mmHg        Initial Exercise Prescription:     Initial Exercise Prescription - 04/22/15 1400    Date of Initial Exercise RX and Referring Provider   Date 04/22/15   Treadmill   MPH 2.5   Grade 0   Minutes 15   Bike   Level 1   Minutes 15   Recumbant Bike   Level 3   Watts 30   Minutes 15   NuStep   Level 3   Watts 40   Minutes 15   Arm Ergometer   Level 1    Watts 10   Minutes 15   Arm/Foot Ergometer   Level 1   Watts 15   Minutes 15   Cybex   Level 2   RPM 40   Minutes 15   Recumbant Elliptical   Level 1   Watts 20   Minutes 15   Elliptical   Level 1   Speed 3   Minutes 5   REL-XR   Level 3   Watts 40   Minutes 15   T5 Nustep   Level 2   Watts 20   Minutes 15   Biostep-RELP   Level 3   Watts 30   Minutes 15   Prescription Details   Frequency (times per week) 3   Duration Progress to 30 minutes of continuous aerobic without signs/symptoms of physical distress   Intensity   THRR REST +  30   Ratings of Perceived Exertion 11-13   Perceived Dyspnea 0-4   Progression   Progression Continue progressive overload as per policy without signs/symptoms or physical distress.   Resistance Training   Training Prescription Yes   Weight 3   Reps 10-12      Perform Capillary Blood Glucose checks as needed.  Exercise Prescription Changes:     Exercise Prescription Changes      04/29/15 0647           Exercise Review   Progression Yes       Response to Exercise   Blood Pressure (Admit) 128/54 mmHg       Blood Pressure (Exercise) 142/74 mmHg       Blood Pressure (Exit) 110/62 mmHg       Heart Rate (Admit) 99 bpm       Heart Rate (Exercise) 108 bpm       Heart Rate (Exit) 93 bpm       Rating of Perceived Exertion (Exercise) 12       Symptoms none       Comments Patient is just beginning program but has tolerated exercise well with no signs or symptoms during his first few exercise sessions.        Duration Progress to 30 minutes of continuous aerobic without signs/symptoms of physical distress       Intensity  Rest + 30       Progression   Progression Continue progressive overload as per policy without signs/symptoms or physical distress.       Resistance Training   Training Prescription Yes       Weight 3       Reps 10-12       Treadmill   MPH 2.5       Grade 0       Minutes 12       T5 Nustep   Level 5        Watts 45       Minutes 15       Biostep-RELP   Level 3       Watts 40       Minutes 15          Exercise Comments:     Exercise Comments      04/29/15 1201 04/29/15 1202         Exercise Comments Daniel Kennedy has  Daniel Kennedy has just begun the program and is tolerating exercise well with no symptoms. He had some difficulty on his first day walking on the TM only due to his gate and never being on a TM before. He was able to get more comfortable on the machine during his next exercise session and was able to walk  the  intensity and duration of his exercise prescription.          Discharge Exercise Prescription (Final Exercise Prescription Changes):     Exercise Prescription Changes - 04/29/15 0647    Exercise Review   Progression Yes   Response to Exercise   Blood Pressure (Admit) 128/54 mmHg   Blood Pressure (Exercise) 142/74 mmHg   Blood Pressure (Exit) 110/62 mmHg   Heart Rate (Admit) 99 bpm   Heart Rate (Exercise) 108 bpm   Heart Rate (Exit) 93 bpm   Rating of Perceived Exertion (Exercise) 12   Symptoms none   Comments Patient is just beginning program but has tolerated exercise well with no signs or symptoms during his first few exercise sessions.    Duration Progress to 30 minutes of continuous aerobic without signs/symptoms of physical distress   Intensity Rest + 30   Progression   Progression Continue progressive overload as per policy without signs/symptoms or physical distress.   Resistance Training   Training Prescription Yes   Weight 3   Reps 10-12   Treadmill   MPH 2.5   Grade 0   Minutes 12   T5 Nustep   Level 5   Watts 45   Minutes 15   Biostep-RELP   Level 3   Watts 40   Minutes 15      Nutrition:  Target Goals: Understanding of nutrition guidelines, daily intake of sodium <1556m, cholesterol <2010m calories 30% from fat and 7% or less from saturated fats, daily to have 5 or more servings of fruits and vegetables.  Biometrics:     Pre  Biometrics - 04/22/15 1424    Pre Biometrics   Height 6' 0.9" (1.852 m)   Weight 158 lb 3.2 oz (71.759 kg)   Waist Circumference 34.75 inches   Hip Circumference 40 inches   Waist to Hip Ratio 0.87 %   BMI (Calculated) 21       Nutrition Therapy Plan and Nutrition Goals:     Nutrition Therapy & Goals - 04/22/15 1501    Personal Nutrition Goals   Personal Goal #1 To cont to  eat healthy "I feel like I eat healthy so I don't see the need to meet individually with the [facility] Registered Dietician.    Intervention Plan   Intervention Prescribe, educate and counsel regarding individualized specific dietary modifications aiming towards targeted core components such as weight, hypertension, lipid management, diabetes, heart failure and other comorbidities.   Expected Outcomes Short Term Goal: Understand basic principles of dietary content, such as calories, fat, sodium, cholesterol and nutrients.;Long Term Goal: Adherence to prescribed nutrition plan.      Nutrition Discharge: Rate Your Plate Scores:   Nutrition Goals Re-Evaluation:     Nutrition Goals Re-Evaluation      04/29/15 0952           Personal Goal #1 Re-Evaluation   Goal Progress Seen Yes       Comments Daniel Kennedy drinks 16 oz a day plus one fruit at least.           Psychosocial: Target Goals: Acknowledge presence or absence of depression, maximize coping skills, provide positive support system. Participant is able to verbalize types and ability to use techniques and skills needed for reducing stress and depression.  Initial Review & Psychosocial Screening:     Initial Psych Review & Screening - 04/22/15 1504    Family Dynamics   Good Support System? Yes   Barriers   Psychosocial barriers to participate in program There are no identifiable barriers or psychosocial needs.   Screening Interventions   Interventions Encouraged to exercise      Quality of Life Scores:   PHQ-9:     Recent Review Flowsheet Data     Depression screen [facility] 2/9 04/22/2015   Decreased Interest 1   Down, Depressed, Hopeless 0   PHQ - 2 Score 1   Altered sleeping 1   Tired, decreased energy 2   Change in appetite 0   Feeling bad or failure about yourself  0   Trouble concentrating 0   Moving slowly or fidgety/restless 0   Suicidal thoughts 0   PHQ-9 Score 4   Difficult doing work/chores Somewhat difficult      Psychosocial Evaluation and Intervention:     Psychosocial Evaluation - 04/29/15 1015    Psychosocial Evaluation & Interventions   Interventions Encouraged to exercise with the program and follow exercise prescription;Stress management education;Relaxation education   Comments Counselor met with Daniel Kennedy today for the initial psychosocial evaluation.  He is a 74 year old who had aortic valve replacement in October and has had subsequent health issues following this that prevented him from attending this program earlier.  He has a strong support system with a spouse of 12 years and (3) adult children who live close by.  Daniel Kennedy is also actively involved in his local church.  He states he is very healthy and has never had surgery until this valve replacement.  He reports that he sleeps well and has a good appetite.  Daniel Kennedy states that his doctor just recently prescribed him something for his mood that he has been on for approximately 3 weeks.  He says he "can't tell any difference" in his mood since then.  Daniel Kennedy denies a history of depression or anxiety but reports his current mood is a "4" on a scale of 1-10 with "10" being the best.  When asked about this, he reports his lack of energy impacts his mood.  He states his current stressors are his wife's recovery from knee replacement surgery and his own personal  health at this time.  His goals are to get his energy, stamina and strength back and get back on the golf course.  Counselor will follow with Daniel Kennedy and continue to assess his mood to see if exercising  consistently will improve this in the near future.     Continued Psychosocial Services Needed Yes  Consistent exercise and attending the psychoeducational components of this program will be beneficial for Daniel Kennedy.  Counselor will continue to assess his mood.      Psychosocial Re-Evaluation:   Vocational Rehabilitation: Provide vocational rehab assistance to qualifying candidates.   Vocational Rehab Evaluation & Intervention:     Vocational Rehab - 04/22/15 1455    Initial Vocational Rehab Evaluation & Intervention   Assessment shows need for Vocational Rehabilitation No      Education: Education Goals: Education classes will be provided on a weekly basis, covering required topics. Participant will state understanding/return demonstration of topics presented.  Learning Barriers/Preferences:     Learning Barriers/Preferences - 04/22/15 1453    Learning Barriers/Preferences   Learning Barriers Hearing   Learning Preferences None      Education Topics: General Nutrition Guidelines/Fats and Fiber: -Group instruction provided by verbal, written material, models and posters to present the general guidelines for heart healthy nutrition. Gives an explanation and review of dietary fats and fiber.   Controlling Sodium/Reading Food Labels: -Group verbal and written material supporting the discussion of sodium use in heart healthy nutrition. Review and explanation with models, verbal and written materials for utilization of the food label.          Cardiac Rehab from 05/06/2015 in [facility] Cardiac and Pulmonary Rehab   Date  04/29/15   Educator  C. Joneen Caraway, RD   Instruction Review Code  2- meets goals/outcomes      Exercise Physiology & Risk Factors: - Group verbal and written instruction with models to review the exercise physiology of the cardiovascular system and associated critical values. Details cardiovascular disease risk factors and the goals associated with each risk  factor.   Aerobic Exercise & Resistance Training: - Gives group verbal and written discussion on the health impact of inactivity. On the components of aerobic and resistive training programs and the benefits of this training and how to safely progress through these programs.   Flexibility, Balance, General Exercise Guidelines: - Provides group verbal and written instruction on the benefits of flexibility and balance training programs. Provides general exercise guidelines with specific guidelines to those with heart or lung disease. Demonstration and skill practice provided.   Stress Management: - Provides group verbal and written instruction about the health risks of elevated stress, cause of high stress, and healthy ways to reduce stress.   Depression: - Provides group verbal and written instruction on the correlation between heart/lung disease and depressed mood, treatment options, and the stigmas associated with seeking treatment.   Anatomy & Physiology of the Heart: - Group verbal and written instruction and models provide basic cardiac anatomy and physiology, with the coronary electrical and arterial systems. Review of: AMI, Angina, Valve disease, Heart Failure, Cardiac Arrhythmia, Pacemakers, and the ICD.   Cardiac Procedures: - Group verbal and written instruction and models to describe the testing methods done to diagnose heart disease. Reviews the outcomes of the test results. Describes the treatment choices: Medical Management, Angioplasty, or Coronary Bypass Surgery.   Cardiac Medications: - Group verbal and written instruction to review commonly prescribed medications for heart disease. Reviews the medication, class of the  drug, and side effects. Includes the steps to properly store meds and maintain the prescription regimen.   Go Sex-Intimacy & Heart Disease, Get SMART - Goal Setting: - Group verbal and written instruction through game format to discuss heart disease and  the return to sexual intimacy. Provides group verbal and written material to discuss and apply goal setting through the application of the S.M.A.R.T. Method.   Other Matters of the Heart: - Provides group verbal, written materials and models to describe Heart Failure, Angina, Valve Disease, and Diabetes in the realm of heart disease. Includes description of the disease process and treatment options available to the cardiac patient.      Cardiac Rehab from 05/06/2015 in [facility] Cardiac and Pulmonary Rehab   Date  05/06/15   Educator  SB   Instruction Review Code  2- meets goals/outcomes      Exercise & Equipment Safety: - Individual verbal instruction and demonstration of equipment use and safety with use of the equipment.      Cardiac Rehab from 05/06/2015 in [facility] Cardiac and Pulmonary Rehab   Date  04/26/15   Educator  Daniel Kennedy   Instruction Review Code  1- partially meets, needs review/practice [Daniel Kennedy almost fell on the treadmill so we got him on the Nuste]      Infection Prevention: - Provides verbal and written material to individual with discussion of infection control including proper hand washing and proper equipment cleaning during exercise session.      Cardiac Rehab from 04/22/2015 in [facility] Cardiac and Pulmonary Rehab   Date  04/22/15   Educator  Daniel Kennedy   Instruction Review Code  2- meets goals/outcomes      Falls Prevention: - Provides verbal and written material to individual with discussion of falls prevention and safety.      Cardiac Rehab from 04/22/2015 in [facility] Cardiac and Pulmonary Rehab   Date  04/22/15   Educator  Daniel Kennedy   Instruction Review Code  2- meets goals/outcomes      Diabetes: - Individual verbal and written instruction to review signs/symptoms of diabetes, desired ranges of glucose level fasting, after meals and with exercise. Advice that pre and post exercise glucose checks will be done for 3 sessions at entry of program.    Knowledge  Questionnaire Score:   Core Components/Risk Factors/Patient Goals at Admission:     Personal Goals and Risk Factors at Admission - 04/22/15 1502    Core Components/Risk Factors/Patient Goals on Admission   Sedentary Yes   Intervention Provide advice, education, support and counseling about physical activity/exercise needs.;Develop an individualized exercise prescription for aerobic and resistive training based on initial evaluation findings, risk stratification, comorbidities and participant's personal goals.   Expected Outcomes Achievement of increased cardiorespiratory fitness and enhanced flexibility, muscular endurance and strength shown through measurements of functional capacity and personal statement of participant.   Increase Strength and Stamina Yes   Intervention Provide advice, education, support and counseling about physical activity/exercise needs.;Develop an individualized exercise prescription for aerobic and resistive training based on initial evaluation findings, risk stratification, comorbidities and participant's personal goals.  "I really want to get back to be able to get back on the golf course.   Expected Outcomes Achievement of increased cardiorespiratory fitness and enhanced flexibility, muscular endurance and strength shown through measurements of functional capacity and personal statement of participant.   Tobacco Cessation Yes   Number of packs per day 0.25   Intervention Assist the participant in steps to quit.  Provide individualized education and counseling about committing to Tobacco Cessation, relapse prevention, and pharmacological support that can be provided by physician.;Advice worker, assist with locating and accessing local/national Quit Smoking programs, and support quit date choice.   Expected Outcomes Short Term: Will quit all tobacco product use, adhering to prevention of relapse plan.;Long Term: Complete abstinence from all tobacco products  for at least 12 months from quit date.   Intervention Provide education, individualized exercise plan and daily activity instruction to help decrease symptoms of SOB with activities of daily living.   Expected Outcomes Short Term: Achieves a reduction of symptoms when performing activities of daily living.   Develop more efficient breathing techniques such as purse lipped breathing and diaphragmatic breathing; and practicing self-pacing with activity Yes      Core Components/Risk Factors/Patient Goals Review:      Goals and Risk Factor Review      04/26/15 0927 04/29/15 0949 05/03/15 0852       Core Components/Risk Factors/Patient Goals Review   Personal Goals Review Sedentary Sedentary Tobacco Cessation     Review Daniel Kennedy said he wanted to do more today but we instructed him first day to keep Rate of Perceived exertion at 11-12.  Daniel Kennedy said he has been able to work out in his yard some which was his goal. Daniel Kennedy had 6 cigarettes in the past 24 hours. Daniel Kennedy reports he really doesn't see the need to quit smoking since what he heard from his doctor that his heart problem didn't have  any thing to do with his lifestyle. Daniel Kennedy states that he is not ready to quit smoking.  He had valve disease and no cardiac risk factors except the tobacco use.  He stated that when he wants to quit, he will be able to readdily quit.      Expected Outcomes Goal to increase his exercise ability.    Short term: Daniel Kennedy decides to quit smoking and is able to quit and maintain.cessation        Core Components/Risk Factors/Patient Goals at Discharge (Final Review):      Goals and Risk Factor Review - 05/03/15 0852    Core Components/Risk Factors/Patient Goals Review   Personal Goals Review Tobacco Cessation   Review Daniel Kennedy states that he is not ready to quit smoking.  He had valve disease and no cardiac risk factors except the tobacco use.  He stated that when he wants to quit, he will be able to readdily quit.    Expected  Outcomes  Short term: Daniel Kennedy decides to quit smoking and is able to quit and maintain.cessation      ITP Comments:     ITP Comments      04/26/15 0926 05/12/15 0938         ITP Comments Daniel Kennedy's first day and he admitted that he has never been on a treadmill before. He almost fell so we changed him to Nustep for now. After class I got on a treadmill and explained it to him.  30 day review.  Continue with ITP  has attended 6 sessions , new to program         Comments:

## 2015-05-13 ENCOUNTER — Encounter: Payer: PPO | Admitting: *Deleted

## 2015-05-13 DIAGNOSIS — Z952 Presence of prosthetic heart valve: Secondary | ICD-10-CM | POA: Diagnosis not present

## 2015-05-13 NOTE — Progress Notes (Signed)
Daily Session Note  Patient Details  Name: Daniel KLEVE Sr. MRN: [id number] Date of Birth: 1940-02-12 Referring Provider:    Encounter Date: 05/13/2015  Check In:     Session Check In - 05/13/15 0848    Check-In   Location [facility]-Cardiac & Pulmonary Rehab   Staff Present Heath Lark, RN, BSN, Laveda Norman, BS, ACSM CEP, Exercise Physiologist;Rebecca Brayton El, DPT, CEEA   Supervising physician immediately available to respond to emergencies See telemetry face sheet for immediately available ER MD   Medication changes reported     No   Fall or balance concerns reported    No   Warm-up and Cool-down Performed on first and last piece of equipment   Resistance Training Performed Yes   VAD Patient? No   Pain Assessment   Currently in Pain? No/denies   Multiple Pain Sites No         Goals Met:  Independence with exercise equipment Exercise tolerated well No report of cardiac concerns or symptoms Strength training completed today  Goals Unmet:  Not Applicable  Comments: Patient completed exercise prescription and all exercise goals during rehab session. The exercise was tolerated well and the patient is progressing in the program.     Dr. Emily Filbert is Medical Director for [place] and [facility].

## 2015-05-17 ENCOUNTER — Encounter: Payer: PPO | Admitting: *Deleted

## 2015-05-17 DIAGNOSIS — Z952 Presence of prosthetic heart valve: Secondary | ICD-10-CM

## 2015-05-17 NOTE — Progress Notes (Signed)
Daily Session Note  Patient Details  Name: Daniel YELLIN Sr. MRN: [id number] Date of Birth: [birth year] Referring Provider:    Encounter Date: 05/17/2015  Check In:     Session Check In - 05/17/15 0911    Check-In   Staff Present Nyoka Cowden, RN;Susanne Bice, RN, BSN, CCRP;Rebecca Sickles, DPT, CEEA   Supervising physician immediately available to respond to emergencies See telemetry face sheet for immediately available ER MD   Medication changes reported     No   Fall or balance concerns reported    No   Warm-up and Cool-down Performed on first and last piece of equipment   VAD Patient? No   Pain Assessment   Currently in Pain? No/denies         Goals Met:  Changing diet to healthy choices, watching portion sizes No report of cardiac concerns or symptoms Strength training completed today  Goals Unmet:  Not Applicable  Comments: Doing well with exercise prescription progression.    Dr. Emily Filbert is Medical Director for [facility] and [facility].

## 2015-05-20 ENCOUNTER — Encounter: Payer: PPO | Attending: Cardiovascular Disease | Admitting: *Deleted

## 2015-05-20 DIAGNOSIS — Z952 Presence of prosthetic heart valve: Secondary | ICD-10-CM | POA: Insufficient documentation

## 2015-05-20 NOTE — Progress Notes (Signed)
Daily Session Note  Patient Details  Name: DONTAYE HUR Sr. MRN: [id number] Date of Birth: August 09, 1940 Referring Provider:    Encounter Date: 05/20/2015  Check In:     Session Check In - 05/20/15 1025    Check-In   Staff Present Heath Lark, RN, BSN, CCRP;Carroll Enterkin, RN, Moises Blood, BS, ACSM CEP, Exercise Physiologist   Supervising physician immediately available to respond to emergencies See telemetry face sheet for immediately available ER MD   Medication changes reported     No   Fall or balance concerns reported    No   Warm-up and Cool-down Performed on first and last piece of equipment   VAD Patient? No   Pain Assessment   Currently in Pain? No/denies         Goals Met:  Independence with exercise equipment Exercise tolerated well No report of cardiac concerns or symptoms  Goals Unmet:  Not Applicable  Comments: Doing well with exercise prescription progression.    Dr. Emily Filbert is Medical Director for [facility].

## 2015-05-24 DIAGNOSIS — Z952 Presence of prosthetic heart valve: Secondary | ICD-10-CM

## 2015-05-24 NOTE — Progress Notes (Signed)
Daily Session Note  Patient Details  Name: Daniel TWEED Sr. MRN: [id number] Date of Birth: 15-Mar-1940 Referring Provider:    Encounter Date: 05/24/2015  Check In:     Session Check In - 05/24/15 0946    Check-In   Location [facility]   Staff Present Heath Lark, RN, BSN, CCRP;Carroll Enterkin, RN, Alex Gardener, DPT, CEEA   Supervising physician immediately available to respond to emergencies See telemetry face sheet for immediately available ER MD   Medication changes reported     No   Fall or balance concerns reported    No   Warm-up and Cool-down Performed on first and last piece of equipment   Resistance Training Performed Yes   VAD Patient? No         Goals Met:  Independence with exercise equipment Exercise tolerated well No report of cardiac concerns or symptoms Strength training completed today  Goals Unmet:  Not Applicable  Comments: Patient completed exercise prescription and all exercise goals during rehab session. The exercise was tolerated well and the patient is progressing in the program.     Dr. Emily Filbert is Medical Director for [facility].

## 2015-05-27 ENCOUNTER — Encounter: Payer: PPO | Admitting: *Deleted

## 2015-05-27 ENCOUNTER — Ambulatory Visit (INDEPENDENT_AMBULATORY_CARE_PROVIDER_SITE_OTHER): Payer: PPO | Admitting: Urology

## 2015-05-27 ENCOUNTER — Encounter: Payer: Self-pay | Admitting: Urology

## 2015-05-27 VITALS — BP 124/75 | HR 87 | Ht 73.0 in | Wt 160.0 lb

## 2015-05-27 DIAGNOSIS — R350 Frequency of micturition: Secondary | ICD-10-CM

## 2015-05-27 DIAGNOSIS — IMO0002 Reserved for concepts with insufficient information to code with codable children: Secondary | ICD-10-CM

## 2015-05-27 DIAGNOSIS — Z952 Presence of prosthetic heart valve: Secondary | ICD-10-CM | POA: Diagnosis not present

## 2015-05-27 DIAGNOSIS — N359 Urethral stricture, unspecified: Secondary | ICD-10-CM | POA: Diagnosis not present

## 2015-05-27 LAB — URINALYSIS, COMPLETE
Bilirubin, UA: NEGATIVE
GLUCOSE, UA: NEGATIVE
KETONES UA: NEGATIVE
Leukocytes, UA: NEGATIVE
Nitrite, UA: NEGATIVE
PROTEIN UA: NEGATIVE
RBC, UA: NEGATIVE
SPEC GRAV UA: 1.01 (ref 1.005–1.030)
UUROB: 0.2 mg/dL (ref 0.2–1.0)
pH, UA: 6 (ref 5.0–7.5)

## 2015-05-27 LAB — MICROSCOPIC EXAMINATION: RBC, UA: NONE SEEN /hpf (ref 0–?)

## 2015-05-27 LAB — BLADDER SCAN AMB NON-IMAGING

## 2015-05-27 MED ORDER — SULFAMETHOXAZOLE-TRIMETHOPRIM 800-160 MG PO TABS
1.0000 | ORAL_TABLET | Freq: Once | ORAL | Status: AC
  Administered 2015-05-27: 1 via ORAL

## 2015-05-27 MED ORDER — LIDOCAINE HCL 2 % EX GEL
1.0000 "application " | Freq: Once | CUTANEOUS | Status: AC
  Administered 2015-05-27: 1 via URETHRAL

## 2015-05-27 NOTE — Procedures (Signed)
    Cystoscopy Procedure Note  Patient identification was confirmed, informed consent was obtained, and patient was prepped using Betadine solution.  Lidocaine jelly was administered per urethral meatus.    Preoperative abx where received prior to procedure.     Pre-Procedure: - Inspection reveals a normal caliber ureteral meatus, However, I was unable to advance the scope beyond the fossa navicularis. As such, I used a low urethral meatus dilator and was able to dilate the urethral meatus atraumatically with relatively minimal pain.  Procedure: The flexible cystoscope was introduced without difficulty - A web-like opening was visualized at the fossa navicularis, No other urethral strictures/lesions are present. - Enlarged prostate lateral obstructing lobes and a prominent middle lobe - Normal bladder neck - Bilateral ureteral orifices identified - Bladder mucosa  reveals no ulcers, tumors, or lesions - No bladder stones - mild-moderate trabeculation  Retroflexion shows slight projection of the intravesical median lobe.   Post-Procedure: - Patient tolerated the procedure well

## 2015-05-27 NOTE — Progress Notes (Signed)
05/27/2015 11:29 AM   Daniel Sims Sr. 13-Sep-1940 [id number]  Referring provider: Tracie Harrier, MD [address] [facility] [place], [place] [address]  Chief Complaint  Patient presents with  . Urinary Frequency    new pt    HPI: Patient presents today for progressive lower urinary tract symptoms. The patient's dates that baseline he had nocturia times 2 or 3, intermittent urinary urgency, and a weaker urinary tract stream. However, following his cardiac surgery in October 2016 his symptoms have changed and have been somewhat progressive. The patient states that it this time he has a very narrow stream with a reasonable force. He does have postvoid dribbling. He has the inability to stop his urinary stream. He feels if he has incomplete bladder emptying. He has at times a splayed urinary stream. He complains of worsening urinary urgency and nocturia times 3. He denies any real penile or suprapubic pain. The patient has been taking tamsulosin 4 3 years approximately. He was started on finasteride several months ago. He has not noted any significant improvement while on finasteride. The patient has not had a rectal exam for quite some time that he can remember. No PSAs are readily available. The patient has no family history of prostate cancer. He has no history of recurrent urinary tract infections, gross hematuria, or kidney stones.  In addition to the above, the patient states that his ejaculate is now clear, and the volume is diminished compared to prior to his surgery.  The patient did struggle with C. Difficile in the postoperative period from his valve replacement surgery. He also was diagnosed with a Escherichia coli urinary tract infection in January of this year.  PMH: Past Medical History  Diagnosis Date  . BPH (benign prostatic hyperplasia)   . Asthma   . Aortic stenosis     EF 50% echo 2010  . Heart murmur   . Shortness of breath dyspnea     WITH  EXERTION   . GERD (gastroesophageal reflux disease)   . C. difficile colitis   . Depression     Surgical History: Past Surgical History  Procedure Laterality Date  . Cardiac catheterization N/A 10/19/2014    Procedure: Right/Left Heart Cath and Coronary Angiography;  Surgeon: Sherren Mocha, MD;  Location: [facility];  Service: Cardiovascular;  Laterality: N/A;  . 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 Tee without cardioversion N/A 11/08/2014    Procedure: TRANSESOPHAGEAL ECHOCARDIOGRAM (TEE);  Surgeon: Gaye Pollack, MD;  Location: [facility];  Service: Open Heart Surgery;  Laterality: N/A;    Home Medications:    Medication List       This list is accurate as of: 05/27/15 11:29 AM.  Always use your most recent med list.               aspirin 81 MG EC tablet  Take 1 tablet (81 mg total) by mouth daily.     citalopram 10 MG tablet  Commonly known as:  CELEXA  Take by mouth.     ferrous sulfate 325 (65 FE) MG tablet  Take 1 tablet by mouth daily.     finasteride 5 MG tablet  Commonly known as:  PROSCAR  Take by mouth.     levocetirizine 5 MG  tablet  Commonly known as:  XYZAL  Take 5 mg by mouth daily.     montelukast 10 MG tablet  Commonly known as:  SINGULAIR  Take 10 mg by mouth at bedtime.     MULTI-VITAMINS Tabs  Take 1 tablet by mouth daily.     omeprazole 20 MG capsule  Commonly known as:  PRILOSEC  Take 20 mg by mouth daily.     sildenafil 20 MG tablet  Commonly known as:  REVATIO     tamsulosin 0.4 MG Caps capsule  Commonly known as:  FLOMAX  Take 0.4 mg by mouth daily.     vitamin C 500 MG tablet  Commonly known as:  ASCORBIC ACID  Take 500 mg by mouth daily.     vitamin E 400 UNIT capsule  Generic drug:  vitamin E  Take 400 Units by mouth  daily.        Allergies:  Allergies  Allergen Reactions  . Penicillin G Other (See Comments)    Has patient had a PCN reaction causing immediate rash, facial/tongue/throat swelling, SOB or lightheadedness with hypotension: No Has patient had a PCN reaction causing severe rash involving mucus membranes or skin necrosis: No Has patient had a PCN reaction that required hospitalization No Has patient had a PCN reaction occurring within the last 10 years: Yes If all of the above answers are "NO", then may proceed with Cephalosporin    Family History: Family History  Problem Relation Age of Onset  . Breast cancer Mother   . Lung cancer Mother   . Heart disease Father   . Diabetes Father   . Kidney cancer Neg Hx   . Kidney disease Neg Hx   . Prostate cancer Neg Hx     Social History:  reports that he has been smoking Cigarettes.  He has a 19.14 pack-year smoking history. He does not have any smokeless tobacco history on file. He reports that he does not drink alcohol or use illicit drugs.  ROS: UROLOGY Frequent Urination?: Yes Hard to postpone urination?: Yes Burning/pain with urination?: No Get up at night to urinate?: Yes Leakage of urine?: Yes Urine stream starts and stops?: No Trouble starting stream?: No Do you have to strain to urinate?: No Blood in urine?: No Urinary tract infection?: Yes Sexually transmitted disease?: No Injury to kidneys or bladder?: No Painful intercourse?: No Weak stream?: Yes Erection problems?: Yes Penile pain?: No  Gastrointestinal Nausea?: No Vomiting?: No Indigestion/heartburn?: No Diarrhea?: Yes Constipation?: No  Constitutional Fever: No Night sweats?: No Weight loss?: No Fatigue?: Yes  Skin Skin rash/lesions?: No Itching?: Yes  Eyes Blurred vision?: No Double vision?: No  Ears/Nose/Throat Sore throat?: No Sinus problems?: Yes  Hematologic/Lymphatic Swollen glands?: No Easy bruising?: No  Cardiovascular Leg  swelling?: No Chest pain?: No  Respiratory Cough?: Yes Shortness of breath?: Yes  Endocrine Excessive thirst?: No  Musculoskeletal Back pain?: No Joint pain?: No  Neurological Headaches?: No Dizziness?: No  Psychologic Depression?: Yes Anxiety?: No  Physical Exam: BP 124/75 mmHg  Pulse 87  Ht 6\' 1"  (1.854 m)  Wt 160 lb (72.576 kg)  BMI 21.11 kg/m2  Constitutional:  Alert and oriented, No acute distress. HEENT: [place] AT, moist mucus membranes.  Trachea midline, no masses. Cardiovascular: No clubbing, cyanosis, or edema. Respiratory: Normal respiratory effort, no increased work of breathing. GI: Abdomen is soft, nontender, nondistended, no abdominal masses GU: No CVA tenderness.  D RE: 40gm prostate, smooth, no nodules Skin: No rashes, bruises  or suspicious lesions. Lymph: No cervical or inguinal adenopathy. Neurologic: Grossly intact, no focal deficits, moving all 4 extremities. Psychiatric: Normal mood and affect.  Laboratory Data: Lab Results  Component Value Date   WBC 29.9* 02/05/2015   HGB 14.6 02/05/2015   HCT 44.3 02/05/2015   MCV 97.6 02/05/2015   PLT 217 02/05/2015    Lab Results  Component Value Date   CREATININE 1.28* 02/05/2015    No results found for: PSA  No results found for: TESTOSTERONE  Lab Results  Component Value Date   HGBA1C 5.5 11/06/2014    Urinalysis    Component Value Date/Time   COLORURINE AMBER* 02/05/2015 1842   APPEARANCEUR HAZY* 02/05/2015 1842   LABSPEC 1.018 02/05/2015 1842   PHURINE 5.0 02/05/2015 1842   GLUCOSEU NEGATIVE 02/05/2015 1842   HGBUR NEGATIVE 02/05/2015 1842   BILIRUBINUR NEGATIVE 02/05/2015 1842   KETONESUR NEGATIVE 02/05/2015 1842   PROTEINUR 30* 02/05/2015 1842   UROBILINOGEN 0.2 11/06/2014 1033   NITRITE NEGATIVE 02/05/2015 1842   LEUKOCYTESUR 2+* 02/05/2015 1842    Pertinent Imaging: Postvoid residual: 60 mL's  Assessment & Plan:  The patient has obstructive urinary tract symptoms,  please see his cystoscopic evaluation for more details, but 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 Otherwise normal.  1. Urinary frequency Our plan is to have the patient use the urethral meatus dilator daily for the next 3 months and then switch to once a week. I suggested that the patient stop taking finasteride if his symptoms after today's dilation are improved. We will plan a follow-up in 6 months. If the patient's symptoms have not improved over the next 3 or 4 weeks I recommended that he follow-up sooner. Further, I recommended the patient discussed PSA screening with his primary care doctor. His digital rectal exam was normal, he has no family history of prostate cancer, minimal risk for prostate cancer.  - Urinalysis, Complete - BLADDER SCAN AMB NON-IMAGING - Cystoscopy   No Follow-up on file.  Ardis Hughs, [facility] [address], [place], [place] [address] [phone]

## 2015-05-27 NOTE — Progress Notes (Signed)
Daily Session Note  Patient Details  Name: Daniel Kennedy Sr. MRN: [id number] Date of Birth: Dec 15, 1940 Referring Provider:    Encounter Date: 05/27/2015  Check In:     Session Check In - 05/27/15 0840    Check-In   Location [facility]   Staff Present Heath Lark, RN, BSN, CCRP;Carroll Enterkin, RN, Moises Blood, BS, ACSM CEP, Exercise Physiologist;Other   Supervising physician immediately available to respond to emergencies See telemetry face sheet for immediately available ER MD   Medication changes reported     No   Fall or balance concerns reported    No   Warm-up and Cool-down Performed on first and last piece of equipment   Resistance Training Performed Yes   VAD Patient? No   Pain Assessment   Currently in Pain? No/denies   Multiple Pain Sites No         Goals Met:  Independence with exercise equipment Exercise tolerated well No report of cardiac concerns or symptoms Strength training completed today  Goals Unmet:  Not Applicable  Comments: Patient completed exercise prescription and all exercise goals during rehab session. The exercise was tolerated well and the patient is progressing in the program.     Dr. Emily Filbert is Medical Director for [facility].

## 2015-05-29 DIAGNOSIS — Z954 Presence of other heart-valve replacement: Secondary | ICD-10-CM | POA: Diagnosis not present

## 2015-05-29 DIAGNOSIS — I359 Nonrheumatic aortic valve disorder, unspecified: Secondary | ICD-10-CM | POA: Diagnosis not present

## 2015-05-29 DIAGNOSIS — R5383 Other fatigue: Secondary | ICD-10-CM | POA: Diagnosis not present

## 2015-05-31 ENCOUNTER — Encounter: Payer: PPO | Admitting: *Deleted

## 2015-05-31 DIAGNOSIS — Z952 Presence of prosthetic heart valve: Secondary | ICD-10-CM | POA: Diagnosis not present

## 2015-05-31 NOTE — Progress Notes (Signed)
Daily Session Note  Patient Details  Name: Daniel TURGEON Sr. MRN: [id number] Date of Birth: Aug 06, 1940 Referring Provider:    Encounter Date: 05/31/2015  Check In:     Session Check In - 05/31/15 0919    Check-In   Location [facility]   Staff Present Heath Lark, RN, BSN, CCRP;Kaleyah Labreck, RN, Moises Blood, BS, ACSM CEP, Exercise Physiologist   Supervising physician immediately available to respond to emergencies See telemetry face sheet for immediately available ER MD   Medication changes reported     No   Fall or balance concerns reported    No   Warm-up and Cool-down Performed on first and last piece of equipment   Resistance Training Performed Yes   VAD Patient? No   Pain Assessment   Currently in Pain? No/denies         Goals Met:  Proper associated with RPD/PD & O2 Sat Exercise tolerated well  Goals Unmet:  Not Applicable  Comments:     Dr. Emily Filbert is Medical Director for [facility].

## 2015-06-03 ENCOUNTER — Encounter: Payer: PPO | Admitting: *Deleted

## 2015-06-03 DIAGNOSIS — Z952 Presence of prosthetic heart valve: Secondary | ICD-10-CM

## 2015-06-03 NOTE — Progress Notes (Signed)
Daily Session Note  Patient Details  Name: Daniel CREAN Sr. MRN: [id number] Date of Birth: [birth year] Referring Provider:    Encounter Date: 06/03/2015  Check In:     Session Check In - 06/03/15 0842    Check-In   Location [facility]   Staff Present Gerlene Burdock, RN, BSN;Susanne Bice, RN, BSN, Laveda Norman, BS, ACSM CEP, Exercise Physiologist;Other   Supervising physician immediately available to respond to emergencies See telemetry face sheet for immediately available ER MD   Medication changes reported     No   Fall or balance concerns reported    No   Warm-up and Cool-down Performed on first and last piece of equipment   Resistance Training Performed Yes   VAD Patient? No   Pain Assessment   Currently in Pain? No/denies   Multiple Pain Sites No         Goals Met:  Independence with exercise equipment Exercise tolerated well No report of cardiac concerns or symptoms Strength training completed today  Goals Unmet:  Not Applicable  Comments: Patient completed exercise prescription and all exercise goals during rehab session. The exercise was tolerated well and the patient is progressing in the program.     Dr. Emily Filbert is Medical Director for [facility].

## 2015-06-07 DIAGNOSIS — Z952 Presence of prosthetic heart valve: Secondary | ICD-10-CM

## 2015-06-07 NOTE — Progress Notes (Signed)
Daily Session Note  Patient Details  Name: Daniel TESTA Sr. MRN: [id number] Date of Birth: March 03, 1940 Referring Provider:    Encounter Date: 06/07/2015  Check In:     Session Check In - 06/07/15 0921    Check-In   Location [facility]   Staff Present Heath Lark, RN, BSN, CCRP;Carroll Enterkin, RN, BSN;Jessica Luan Pulling, MA, ACSM RCEP, Exercise Physiologist;Rashae Rother Oletta Darter, BA, ACSM CEP, Exercise Physiologist   Supervising physician immediately available to respond to emergencies See telemetry face sheet for immediately available ER MD   Medication changes reported     No   Fall or balance concerns reported    No   Warm-up and Cool-down Performed on first and last piece of equipment   Resistance Training Performed Yes   VAD Patient? No   Pain Assessment   Currently in Pain? No/denies         Goals Met:  Independence with exercise equipment Exercise tolerated well No report of cardiac concerns or symptoms Strength training completed today  Goals Unmet:  Not Applicable  Comments: Daniel Kennedy is progressing well with exercise   Dr. Emily Filbert is Medical Director for [facility].

## 2015-06-09 ENCOUNTER — Encounter: Payer: Self-pay | Admitting: *Deleted

## 2015-06-09 DIAGNOSIS — Z952 Presence of prosthetic heart valve: Secondary | ICD-10-CM

## 2015-06-09 NOTE — Progress Notes (Signed)
Cardiac Individual Treatment Plan  Patient Details  Name: Daniel GENDRON Sr. MRN: [id number] Date of Birth: 30-Nov-1940 Referring Provider:    Initial Encounter Date:       Cardiac Rehab from 04/22/2015 in [facility] Cardiac and Pulmonary Rehab   Date  04/22/15      Visit Diagnosis: S/P AVR (aortic valve replacement)  Patient's Home Medications on Admission:  Current outpatient prescriptions:  .  aspirin EC 81 MG EC tablet, Take 1 tablet (81 mg total) by mouth daily., Disp: , Rfl:  .  citalopram (CELEXA) 10 MG tablet, Take by mouth., Disp: , Rfl:  .  ferrous sulfate 325 (65 FE) MG tablet, Take 1 tablet by mouth daily., Disp: , Rfl:  .  finasteride (PROSCAR) 5 MG tablet, Take by mouth., Disp: , Rfl:  .  levocetirizine (XYZAL) 5 MG tablet, Take 5 mg by mouth daily., Disp: , Rfl:  .  montelukast (SINGULAIR) 10 MG tablet, Take 10 mg by mouth at bedtime., Disp: , Rfl:  .  Multiple Vitamin (MULTI-VITAMINS) TABS, Take 1 tablet by mouth daily., Disp: , Rfl:  .  omeprazole (PRILOSEC) 20 MG capsule, Take 20 mg by mouth daily., Disp: , Rfl:  .  sildenafil (REVATIO) 20 MG tablet, , Disp: , Rfl:  .  tamsulosin (FLOMAX) 0.4 MG CAPS capsule, Take 0.4 mg by mouth daily., Disp: , Rfl:  .  vitamin C (ASCORBIC ACID) 500 MG tablet, Take 500 mg by mouth daily., Disp: , Rfl:  .  vitamin E (VITAMIN E) 400 UNIT capsule, Take 400 Units by mouth daily., Disp: , Rfl:   Past Medical History: Past Medical History  Diagnosis Date  . BPH (benign prostatic hyperplasia)   . Asthma   . Aortic stenosis     EF 50% echo 2010  . Heart murmur   . Shortness of breath dyspnea     WITH EXERTION   . GERD (gastroesophageal reflux disease)   . C. difficile colitis   . Depression     Tobacco Use: History  Smoking status  . Current Every Day Smoker -- 0.33 packs/day for 58 years  . Types: Cigarettes  Smokeless tobacco  . Not on file    Labs: Recent Review Flowsheet Data    Labs for ITP Cardiac and Pulmonary Rehab  Latest Ref Rng 11/08/2014 11/08/2014 11/08/2014 11/08/2014 11/08/2014   PHART 7.350 - 7.450 - 7.301(L) 7.317(L) 7.315(L) -   PCO2ART 35.0 - 45.0 mmHg - 54.1(H) 40.7 39.9 -   HCO3 20.0 - 24.0 mEq/L - 26.9(H) 21.0 20.5 -   TCO2 0 - 100 mmol/L 26 29 22 22 19    ACIDBASEDEF 0.0 - 2.0 mmol/L - 1.0 5.0(H) 6.0(H) -   O2SAT - - 99.0 97.0 98.0 -       Exercise Target Goals:    Exercise Program Goal: Individual exercise prescription set with THRR, safety & activity barriers. Participant demonstrates ability to understand and report RPE using BORG scale, to self-measure pulse accurately, and to acknowledge the importance of the exercise prescription.  Exercise Prescription Goal: Starting with aerobic activity 30 plus minutes a day, 3 days per week for initial exercise prescription. Provide home exercise prescription and guidelines that participant acknowledges understanding prior to discharge.  Activity Barriers & Risk Stratification:     Activity Barriers & Cardiac Risk Stratification - 04/22/15 1452    Activity Barriers & Cardiac Risk Stratification   Activity Barriers Shortness of Breath   Cardiac Risk Stratification High  6 Minute Walk:     6 Minute Walk      04/22/15 1418       6 Minute Walk   Phase Initial     Distance 1345 feet     Walk Time 6 minutes     # of Rest Breaks 0     RPE 9     Symptoms No     Resting HR 84 bpm     Resting BP 128/80 mmHg     Max Ex. HR 108 bpm     Max Ex. BP 130/60 mmHg        Initial Exercise Prescription:     Initial Exercise Prescription - 04/22/15 1400    Date of Initial Exercise RX and Referring Provider   Date 04/22/15   Treadmill   MPH 2.5   Grade 0   Minutes 15   Bike   Level 1   Minutes 15   Recumbant Bike   Level 3   Watts 30   Minutes 15   NuStep   Level 3   Watts 40   Minutes 15   Arm Ergometer   Level 1   Watts 10   Minutes 15   Arm/Foot Ergometer   Level 1   Watts 15   Minutes 15   Cybex   Level 2    RPM 40   Minutes 15   Recumbant Elliptical   Level 1   Watts 20   Minutes 15   Elliptical   Level 1   Speed 3   Minutes 5   REL-XR   Level 3   Watts 40   Minutes 15   T5 Nustep   Level 2   Watts 20   Minutes 15   Biostep-RELP   Level 3   Watts 30   Minutes 15   Prescription Details   Frequency (times per week) 3   Duration Progress to 30 minutes of continuous aerobic without signs/symptoms of physical distress   Intensity   THRR REST +  30   Ratings of Perceived Exertion 11-13   Perceived Dyspnea 0-4   Progression   Progression Continue progressive overload as per policy without signs/symptoms or physical distress.   Resistance Training   Training Prescription Yes   Weight 3   Reps 10-12      Perform Capillary Blood Glucose checks as needed.  Exercise Prescription Changes:     Exercise Prescription Changes      04/29/15 0647 05/29/15 0700         Exercise Review   Progression Yes Yes      Response to Exercise   Blood Pressure (Admit) 128/54 mmHg 120/60 mmHg      Blood Pressure (Exercise) 142/74 mmHg 168/88 mmHg      Blood Pressure (Exit) 110/62 mmHg 134/60 mmHg      Heart Rate (Admit) 99 bpm 73 bpm      Heart Rate (Exercise) 108 bpm 134 bpm      Heart Rate (Exit) 93 bpm 116 bpm      Rating of Perceived Exertion (Exercise) 12 14      Symptoms none none      Comments Patient is just beginning program but has tolerated exercise well with no signs or symptoms during his first few exercise sessions.        Duration Progress to 30 minutes of continuous aerobic without signs/symptoms of physical distress Progress to 30 minutes of continuous aerobic without signs/symptoms of  physical distress      Intensity Rest + 30 Rest + 40      Progression   Progression Continue progressive overload as per policy without signs/symptoms or physical distress. Continue progressive overload as per policy without signs/symptoms or physical distress.      Resistance Training    Training Prescription Yes Yes      Weight 3 4      Reps 10-12 10-12      Interval Training   Interval Training  Yes      Equipment  T5 Nustep      Comments  Intervals 2 min by 30 second. RPE should range between 13-15, watts on T5 range 65-75.       Treadmill   MPH 2.5 3      Grade 0 5      Minutes 12 15      T5 Nustep   Level 5 8      Watts 45 75  Intervals: watts from 65-75      Minutes 15 20      Biostep-RELP   Level 3 --      Watts 40 --      Minutes 15 --         Exercise Comments:     Exercise Comments      04/29/15 1201 04/29/15 1202 05/27/15 1223 05/29/15 0706 05/31/15 1013   Exercise Comments Chaske has  Troi has just begun the program and is tolerating exercise well with no symptoms. He had some difficulty on his first day walking on the TM only due to his gate and never being on a TM before. He was able to get more comfortable on the machine during his next exercise session and was able to walk  the  intensity and duration of his exercise prescription.  Loyde stated that he is feeling stronger and able to do more daily activities. He enjoys golf and is finally going to attempt to play tomorrow (05/28/15) with a group of seniors. He has not played since August 2016.  Reviewed individualized exercise prescription and made increases per departmental policy. Exercise increases were discussed with the patient and they were able to perform the new work loads without issue (no signs or symptoms).  Interval training reommendations and benefits discussed with patient.  Maxmillian was able to go golfing this week for the first time in a while. He tolerated it well physically and enjoyed his time.       Discharge Exercise Prescription (Final Exercise Prescription Changes):     Exercise Prescription Changes - 05/29/15 0700    Exercise Review   Progression Yes   Response to Exercise   Blood Pressure (Admit) 120/60 mmHg   Blood Pressure (Exercise) 168/88 mmHg   Blood Pressure (Exit)  134/60 mmHg   Heart Rate (Admit) 73 bpm   Heart Rate (Exercise) 134 bpm   Heart Rate (Exit) 116 bpm   Rating of Perceived Exertion (Exercise) 14   Symptoms none   Duration Progress to 30 minutes of continuous aerobic without signs/symptoms of physical distress   Intensity Rest + 40   Progression   Progression Continue progressive overload as per policy without signs/symptoms or physical distress.   Resistance Training   Training Prescription Yes   Weight 4   Reps 10-12   Interval Training   Interval Training Yes   Equipment T5 Nustep   Comments Intervals 2 min by 30 second. RPE should range between 13-15, watts on T5  range 65-75.    Treadmill   MPH 3   Grade 5   Minutes 15   T5 Nustep   Level 8   Watts 75  Intervals: watts from 65-75   Minutes 20   Biostep-RELP   Level --   Watts --   Minutes --      Nutrition:  Target Goals: Understanding of nutrition guidelines, daily intake of sodium <1560m, cholesterol <206m calories 30% from fat and 7% or less from saturated fats, daily to have 5 or more servings of fruits and vegetables.  Biometrics:     Pre Biometrics - 04/22/15 1424    Pre Biometrics   Height 6' 0.9" (1.852 m)   Weight 158 lb 3.2 oz (71.759 kg)   Waist Circumference 34.75 inches   Hip Circumference 40 inches   Waist to Hip Ratio 0.87 %   BMI (Calculated) 21       Nutrition Therapy Plan and Nutrition Goals:     Nutrition Therapy & Goals - 04/22/15 1501    Personal Nutrition Goals   Personal Goal #1 To cont to eat healthy "I feel like I eat healthy so I don't see the need to meet individually with the [facility] Registered Dietician.    Intervention Plan   Intervention Prescribe, educate and counsel regarding individualized specific dietary modifications aiming towards targeted core components such as weight, hypertension, lipid management, diabetes, heart failure and other comorbidities.   Expected Outcomes Short Term Goal: Understand basic principles of  dietary content, such as calories, fat, sodium, cholesterol and nutrients.;Long Term Goal: Adherence to prescribed nutrition plan.      Nutrition Discharge: Rate Your Plate Scores:   Nutrition Goals Re-Evaluation:     Nutrition Goals Re-Evaluation      04/29/15 0952           Personal Goal #1 Re-Evaluation   Goal Progress Seen Yes       Comments JaAlbierinks 16 oz a day plus one fruit at least.           Psychosocial: Target Goals: Acknowledge presence or absence of depression, maximize coping skills, provide positive support system. Participant is able to verbalize types and ability to use techniques and skills needed for reducing stress and depression.  Initial Review & Psychosocial Screening:     Initial Psych Review & Screening - 04/22/15 1504    Family Dynamics   Good Support System? Yes   Barriers   Psychosocial barriers to participate in program There are no identifiable barriers or psychosocial needs.   Screening Interventions   Interventions Encouraged to exercise      Quality of Life Scores:   PHQ-9:     Recent Review Flowsheet Data    [facility]   Decreased Interest 1   Down, Depressed, Hopeless 0   PHQ - 2 Score 1   Altered sleeping 1   Tired, decreased energy 2   Change in appetite 0   Feeling bad or failure about yourself  0   Trouble concentrating 0   Moving slowly or fidgety/restless 0   Suicidal thoughts 0   PHQ-9 Score 4   Difficult doing work/chores Somewhat difficult      Psychosocial Evaluation and Intervention:     Psychosocial Evaluation - 04/29/15 1015    Psychosocial Evaluation & Interventions   Interventions Encouraged to exercise with the program and follow exercise prescription;Stress management education;Relaxation education   Comments Counselor met with Mr. MaWeatheralloday for the  initial psychosocial evaluation.  He is a 75 year old who had aortic valve replacement in October and has had subsequent  health issues following this that prevented him from attending this program earlier.  He has a strong support system with a spouse of 64 years and (3) adult children who live close by.  Mr. Adkins is also actively involved in his local church.  He states he is very healthy and has never had surgery until this valve replacement.  He reports that he sleeps well and has a good appetite.  Mr. Pellerito states that his doctor just recently prescribed him something for his mood that he has been on for approximately 3 weeks.  He says he "can't tell any difference" in his mood since then.  Mr. Wile denies a history of depression or anxiety but reports his current mood is a "4" on a scale of 1-10 with "10" being the best.  When asked about this, he reports his lack of energy impacts his mood.  He states his current stressors are his wife's recovery from knee replacement surgery and his own personal health at this time.  His goals are to get his energy, stamina and strength back and get back on the golf course.  Counselor will follow with Mr. Carrick and continue to assess his mood to see if exercising consistently will improve this in the near future.     Continued Psychosocial Services Needed Yes  Consistent exercise and attending the psychoeducational components of this program will be beneficial for Mr. Carver.  Counselor will continue to assess his mood.      Psychosocial Re-Evaluation:     Psychosocial Re-Evaluation      05/20/15 0958           Psychosocial Re-Evaluation   Comments Counselor follow up with Mr. Demelo reporting feeling increased energy since coming into this program.  He states his mood has improved as well and when asked about the medications, he reports he is taking daily, but still "can't tell any difference."  Mr. Parcell attributes the improvement in mood to the consistent exercise and plans to speak to his doctor about discontinuing the medications.  He also reports now that the  weather has improved he feels better overall and is able to get outside more.  Counselor will continue to follow with Mr. Farnan in the future.          Vocational Rehabilitation: Provide vocational rehab assistance to qualifying candidates.   Vocational Rehab Evaluation & Intervention:     Vocational Rehab - 04/22/15 1455    Initial Vocational Rehab Evaluation & Intervention   Assessment shows need for Vocational Rehabilitation No      Education: Education Goals: Education classes will be provided on a weekly basis, covering required topics. Participant will state understanding/return demonstration of topics presented.  Learning Barriers/Preferences:     Learning Barriers/Preferences - 04/22/15 1453    Learning Barriers/Preferences   Learning Barriers Hearing   Learning Preferences None      Education Topics: General Nutrition Guidelines/Fats and Fiber: -Group instruction provided by verbal, written material, models and posters to present the general guidelines for heart healthy nutrition. Gives an explanation and review of dietary fats and fiber.   Controlling Sodium/Reading Food Labels: -Group verbal and written material supporting the discussion of sodium use in heart healthy nutrition. Review and explanation with models, verbal and written materials for utilization of the food label.  Cardiac Rehab from 06/03/2015 in [facility] Cardiac and Pulmonary Rehab   Date  04/29/15   Educator  C. Joneen Caraway, RD   Instruction Review Code  2- meets goals/outcomes      Exercise Physiology & Risk Factors: - Group verbal and written instruction with models to review the exercise physiology of the cardiovascular system and associated critical values. Details cardiovascular disease risk factors and the goals associated with each risk factor.   Aerobic Exercise & Resistance Training: - Gives group verbal and written discussion on the health impact of inactivity. On the components  of aerobic and resistive training programs and the benefits of this training and how to safely progress through these programs.      Cardiac Rehab from 06/03/2015 in [facility] Cardiac and Pulmonary Rehab   Date  05/13/15   Educator  RS   Instruction Review Code  2- meets goals/outcomes      Flexibility, Balance, General Exercise Guidelines: - Provides group verbal and written instruction on the benefits of flexibility and balance training programs. Provides general exercise guidelines with specific guidelines to those with heart or lung disease. Demonstration and skill practice provided.   Stress Management: - Provides group verbal and written instruction about the health risks of elevated stress, cause of high stress, and healthy ways to reduce stress.   Depression: - Provides group verbal and written instruction on the correlation between heart/lung disease and depressed mood, treatment options, and the stigmas associated with seeking treatment.   Anatomy & Physiology of the Heart: - Group verbal and written instruction and models provide basic cardiac anatomy and physiology, with the coronary electrical and arterial systems. Review of: AMI, Angina, Valve disease, Heart Failure, Cardiac Arrhythmia, Pacemakers, and the ICD.      Cardiac Rehab from 06/03/2015 in [facility] Cardiac and Pulmonary Rehab   Date  05/20/15   Educator  SB   Instruction Review Code  2- meets goals/outcomes      Cardiac Procedures: - Group verbal and written instruction and models to describe the testing methods done to diagnose heart disease. Reviews the outcomes of the test results. Describes the treatment choices: Medical Management, Angioplasty, or Coronary Bypass Surgery.      Cardiac Rehab from 06/03/2015 in [facility] Cardiac and Pulmonary Rehab   Date  05/27/15   Educator  CE   Instruction Review Code  2- meets goals/outcomes      Cardiac Medications: - Group verbal and written instruction to review commonly  prescribed medications for heart disease. Reviews the medication, class of the drug, and side effects. Includes the steps to properly store meds and maintain the prescription regimen.      Cardiac Rehab from 06/03/2015 in [facility] Cardiac and Pulmonary Rehab   Date  06/03/15   Educator  SB   Instruction Review Code  2- meets goals/outcomes      Go Sex-Intimacy & Heart Disease, Get SMART - Goal Setting: - Group verbal and written instruction through game format to discuss heart disease and the return to sexual intimacy. Provides group verbal and written material to discuss and apply goal setting through the application of the S.M.A.R.T. Method.      Cardiac Rehab from 06/03/2015 in [facility] Cardiac and Pulmonary Rehab   Date  05/27/15   Educator  CE   Instruction Review Code  2- meets goals/outcomes      Other Matters of the Heart: - Provides group verbal, written materials and models to describe Heart Failure, Angina, Valve Disease, and Diabetes  in the realm of heart disease. Includes description of the disease process and treatment options available to the cardiac patient.      Cardiac Rehab from 06/03/2015 in [facility] Cardiac and Pulmonary Rehab   Date  05/20/15   Educator  SB   Instruction Review Code  2- meets goals/outcomes      Exercise & Equipment Safety: - Individual verbal instruction and demonstration of equipment use and safety with use of the equipment.      Cardiac Rehab from 06/03/2015 in [facility] Cardiac and Pulmonary Rehab   Date  04/26/15   Educator  C. EnterkinRN   Instruction Review Code  1- partially meets, needs review/practice [Sid almost fell on the treadmill so we got him on the [place]]      Infection Prevention: - Provides verbal and written material to individual with discussion of infection control including proper hand washing and proper equipment cleaning during exercise session.      Cardiac Rehab from 04/22/2015 in [facility] Cardiac and Pulmonary Rehab   Date  04/22/15    Educator  C. EnterkinRN   Instruction Review Code  2- meets goals/outcomes      Falls Prevention: - Provides verbal and written material to individual with discussion of falls prevention and safety.      Cardiac Rehab from 04/22/2015 in [facility] Cardiac and Pulmonary Rehab   Date  04/22/15   Educator  C. T[place]   Instruction Review Code  2- meets goals/outcomes      Diabetes: - Individual verbal and written instruction to review signs/symptoms of diabetes, desired ranges of glucose level fasting, after meals and with exercise. Advice that pre and post exercise glucose checks will be done for 3 sessions at entry of program.    Knowledge Questionnaire Score:   Core Components/Risk Factors/Patient Goals at Admission:     Personal Goals and Risk Factors at Admission - 04/22/15 1502    Core Components/Risk Factors/Patient Goals on Admission   Sedentary Yes   Intervention Provide advice, education, support and counseling about physical activity/exercise needs.;Develop an individualized exercise prescription for aerobic and resistive training based on initial evaluation findings, risk stratification, comorbidities and participant's personal goals.   Expected Outcomes Achievement of increased cardiorespiratory fitness and enhanced flexibility, muscular endurance and strength shown through measurements of functional capacity and personal statement of participant.   Increase Strength and Stamina Yes   Intervention Provide advice, education, support and counseling about physical activity/exercise needs.;Develop an individualized exercise prescription for aerobic and resistive training based on initial evaluation findings, risk stratification, comorbidities and participant's personal goals.  "I really want to get back to be able to get back on the golf course.   Expected Outcomes Achievement of increased cardiorespiratory fitness and enhanced flexibility, muscular endurance and strength shown  through measurements of functional capacity and personal statement of participant.   Tobacco Cessation Yes   Number of packs per day 0.25   Intervention Assist the participant in steps to quit. Provide individualized education and counseling about committing to Tobacco Cessation, relapse prevention, and pharmacological support that can be provided by physician.;Advice worker, assist with locating and accessing local/national Quit Smoking programs, and support quit date choice.   Expected Outcomes Short Term: Will quit all tobacco product use, adhering to prevention of relapse plan.;Long Term: Complete abstinence from all tobacco products for at least 12 months from quit date.   Intervention Provide education, individualized exercise plan and daily activity instruction to help decrease symptoms of SOB with activities  of daily living.   Expected Outcomes Short Term: Achieves a reduction of symptoms when performing activities of daily living.   Develop more efficient breathing techniques such as purse lipped breathing and diaphragmatic breathing; and practicing self-pacing with activity Yes      Core Components/Risk Factors/Patient Goals Review:      Goals and Risk Factor Review      04/26/15 0927 04/29/15 0949 05/03/15 0852 05/27/15 1213     Core Components/Risk Factors/Patient Goals Review   Personal Goals Review Sedentary Sedentary Tobacco Cessation Sedentary    Review Mart said he wanted to do more today but we instructed him first day to keep Rate of Perceived exertion at 11-12.  Navjot said he has been able to work out in his yard some which was his goal. Jahsiah had 6 cigarettes in the past 24 hours. Jaquel reports he really doesn't see the need to quit smoking since what he heard from his doctor that his heart problem didn't have  any thing to do with his lifestyle. Joaquim states that he is not ready to quit smoking.  He had valve disease and no cardiac risk factors except the tobacco  use.  He stated that when he wants to quit, he will be able to readdily quit.  Tejas stated that he is feeling stronger and able to do more daily activities. He enjoys golf and is finally going to attempt to play tomorrow (05/28/15) with a group of seniors. He has not played since August 2016.     Expected Outcomes Goal to increase his exercise ability.    Short term: Salvadore decides to quit smoking and is able to quit and maintain.cessation Gram will be able to tolerate golfing again since he is increasing his strengh and stamina from consistant exercise.        Core Components/Risk Factors/Patient Goals at Discharge (Final Review):      Goals and Risk Factor Review - 05/27/15 1213    Core Components/Risk Factors/Patient Goals Review   Personal Goals Review Sedentary   Review Therron stated that he is feeling stronger and able to do more daily activities. He enjoys golf and is finally going to attempt to play tomorrow (05/28/15) with a group of seniors. He has not played since August 2016.    Expected Outcomes Harvin will be able to tolerate golfing again since he is increasing his strengh and stamina from consistant exercise.       ITP Comments:     ITP Comments      04/26/15 0926 05/12/15 6979 06/09/15 1206       ITP Comments Rowen's first day and he admitted that he has never been on a treadmill before. He almost fell so we changed him to Nustep for now. After class I got on a treadmill and explained it to him.  30 day review.  Continue with ITP  has attended 6 sessions , new to program 30 day review. Continue with ITP        Comments:

## 2015-06-10 ENCOUNTER — Encounter: Payer: PPO | Admitting: *Deleted

## 2015-06-10 DIAGNOSIS — Z952 Presence of prosthetic heart valve: Secondary | ICD-10-CM | POA: Diagnosis not present

## 2015-06-10 NOTE — Progress Notes (Signed)
Daily Session Note  Patient Details  Name: Daniel CROMBIE Sr. MRN: [id number] Date of Birth: [birth year] Referring Provider:    Encounter Date: 06/10/2015  Check In:     Session Check In - 06/10/15 0844    Check-In   Location [facility]   Staff Present Nyoka Cowden, RN;Carroll Enterkin, RN, Moises Blood, BS, ACSM CEP, Exercise Physiologist   Supervising physician immediately available to respond to emergencies See telemetry face sheet for immediately available ER MD   Medication changes reported     No   Fall or balance concerns reported    No   Warm-up and Cool-down Performed on first and last piece of equipment   Resistance Training Performed Yes   VAD Patient? No   Pain Assessment   Currently in Pain? No/denies   Multiple Pain Sites No         Goals Met:  Independence with exercise equipment Exercise tolerated well No report of cardiac concerns or symptoms Strength training completed today  Goals Unmet:  Not Applicable  Comments: Patient completed exercise prescription and all exercise goals during rehab session. The exercise was tolerated well and the patient is progressing in the program.     Dr. Emily Filbert is Medical Director for [place] and [facility].

## 2015-06-21 ENCOUNTER — Encounter: Payer: PPO | Attending: Cardiovascular Disease | Admitting: *Deleted

## 2015-06-21 DIAGNOSIS — Z952 Presence of prosthetic heart valve: Secondary | ICD-10-CM | POA: Insufficient documentation

## 2015-06-21 NOTE — Progress Notes (Addendum)
Daily Session Note  Patient Details  Name: Daniel CORDREY Sr. MRN: [id number] Date of Birth: 12-05-1940 Referring Provider:    Encounter Date: 06/21/2015  Check In:     Session Check In - 06/21/15 0915    Check-In   Location [facility]   Staff Present Heath Lark, RN, BSN, CCRP;Carroll Enterkin, RN, BSN;Stacey Blanch Media, RRT, RCP, Respiratory Lennie Hummer, MA, ACSM RCEP, Exercise Physiologist   Supervising physician immediately available to respond to emergencies See telemetry face sheet for immediately available ER MD   Medication changes reported     No   Fall or balance concerns reported    No   Warm-up and Cool-down Performed on first and last piece of equipment   VAD Patient? No   Pain Assessment   Currently in Pain? No/denies         Goals Met:  Independence with exercise equipment Exercise tolerated well No report of cardiac concerns or symptoms Strength training completed today  Goals Unmet:  Not Applicable  Comments: Doing well with exercise prescription progression. Reviewed individual exercise prescription with patient.  Changes were noted and patient was able to perform at new work load without signs or symptoms.   Dr. Emily Filbert is Medical Director for [place] and [facility].

## 2015-06-24 ENCOUNTER — Encounter: Payer: PPO | Admitting: *Deleted

## 2015-06-24 DIAGNOSIS — Z952 Presence of prosthetic heart valve: Secondary | ICD-10-CM

## 2015-06-24 NOTE — Progress Notes (Signed)
Daily Session Note  Patient Details  Name: LEGRAND LASSER Sr. MRN: [id number] Date of Birth: 22-Apr-1940 Referring Provider:    Encounter Date: 06/24/2015  Check In:     Session Check In - 06/24/15 0853    Check-In   Location [facility]   Staff Present Alberteen Sam, MA, ACSM RCEP, Exercise Physiologist;Susanne Bice, RN, BSN, Laveda Norman, BS, ACSM CEP, Exercise Physiologist   Supervising physician immediately available to respond to emergencies See telemetry face sheet for immediately available ER MD   Medication changes reported     No   Fall or balance concerns reported    No   Warm-up and Cool-down Performed on first and last piece of equipment   Resistance Training Performed No   VAD Patient? No   Pain Assessment   Currently in Pain? No/denies   Multiple Pain Sites No         Goals Met:  Independence with exercise equipment Exercise tolerated well No report of cardiac concerns or symptoms  Goals Unmet:  Not Applicable  Comments: Pt able to follow exercise prescription today without complaint.  Will continue to monitor for progression.    Dr. Emily Filbert is Medical Director for [place] and [facility].

## 2015-06-26 NOTE — Telephone Encounter (Signed)
Daniel Kennedy

## 2015-06-28 ENCOUNTER — Encounter: Payer: PPO | Admitting: *Deleted

## 2015-06-28 DIAGNOSIS — Z952 Presence of prosthetic heart valve: Secondary | ICD-10-CM

## 2015-06-28 NOTE — Progress Notes (Signed)
Daily Session Note  Patient Details  Name: Daniel NANNEY Sr. MRN: [id number] Date of Birth: 12/02/1940 Referring Provider:    Encounter Date: 06/28/2015  Check In:     Session Check In - 06/28/15 0808    Check-In   Location [facility]   Staff Present Gerlene Burdock, RN, Levie Heritage, MA, ACSM RCEP, Exercise Physiologist;Susanne Bice, RN, BSN, [employer] physician immediately available to respond to emergencies See telemetry face sheet for immediately available ER MD   Medication changes reported     No   Fall or balance concerns reported    No   Warm-up and Cool-down Performed on first and last piece of equipment   Resistance Training Performed Yes   VAD Patient? No   Pain Assessment   Currently in Pain? No/denies         Goals Met:  Proper associated with RPD/PD & O2 Sat Exercise tolerated well Personal goals reviewed  Goals Unmet:  Not Applicable  Comments:    Dr. Emily Filbert is Medical Director for [place] and [facility].

## 2015-07-01 ENCOUNTER — Encounter: Payer: PPO | Admitting: *Deleted

## 2015-07-01 DIAGNOSIS — Z952 Presence of prosthetic heart valve: Secondary | ICD-10-CM

## 2015-07-01 NOTE — Progress Notes (Signed)
Daily Session Note  Patient Details  Name: Daniel Kennedy. MRN: [id number] Date of Birth: May 21, 1940 Referring Provider:    Encounter Date: 07/01/2015  Check In:     Session Check In - 07/01/15 0857    Check-In   Location [facility]   Staff Present Alberteen Sam, MA, ACSM RCEP, Exercise Physiologist;Susanne Bice, RN, BSN, Laveda Norman, BS, ACSM CEP, Exercise Physiologist;Other   Supervising physician immediately available to respond to emergencies See telemetry face sheet for immediately available ER MD   Medication changes reported     No   Fall or balance concerns reported    No   Warm-up and Cool-down Performed on first and last piece of equipment   Resistance Training Performed No   Pain Assessment   Currently in Pain? No/denies   Multiple Pain Sites No         Goals Met:  Independence with exercise equipment Exercise tolerated well No report of cardiac concerns or symptoms  Goals Unmet:  Not Applicable  Comments: Pt able to follow exercise prescription today without complaint.  Will continue to monitor for progression.    Dr. Emily Filbert is Medical Director for [place] and [facility].

## 2015-07-08 ENCOUNTER — Encounter: Payer: PPO | Admitting: *Deleted

## 2015-07-08 DIAGNOSIS — Z952 Presence of prosthetic heart valve: Secondary | ICD-10-CM

## 2015-07-08 NOTE — Progress Notes (Signed)
Daily Session Note  Patient Details  Name: Daniel SCHUBRING Sr. MRN: [id number] Date of Birth: 18-Jun-1940 Referring Provider:        Cardiac Rehab from 07/01/2015 in [facility]   Referring Provider  Sherren Mocha MD      Encounter Date: 07/08/2015  Check In:     Session Check In - 07/08/15 0811    Check-In   Location [facility]   Staff Present Alberteen Sam, MA, ACSM RCEP, Exercise Physiologist;Susanne Bice, RN, BSN, Laveda Norman, BS, ACSM CEP, Exercise Physiologist   Supervising physician immediately available to respond to emergencies See telemetry face sheet for immediately available ER MD   Medication changes reported     No   Fall or balance concerns reported    No   Warm-up and Cool-down Performed on first and last piece of equipment   Resistance Training Performed Yes   VAD Patient? No   Pain Assessment   Currently in Pain? No/denies   Multiple Pain Sites No         Goals Met:  Independence with exercise equipment Exercise tolerated well No report of cardiac concerns or symptoms Strength training completed today  Goals Unmet:  Not Applicable  Comments: Pt able to follow exercise prescription today without complaint.  Will continue to monitor for progression.    Dr. Emily Filbert is Medical Director for [place] and [facility].

## 2015-07-10 ENCOUNTER — Encounter: Payer: PPO | Admitting: *Deleted

## 2015-07-10 ENCOUNTER — Encounter: Payer: Self-pay | Admitting: *Deleted

## 2015-07-10 DIAGNOSIS — Z952 Presence of prosthetic heart valve: Secondary | ICD-10-CM

## 2015-07-10 NOTE — Progress Notes (Signed)
This encounter was created in error - please disregard.

## 2015-07-10 NOTE — Progress Notes (Signed)
Cardiac Individual Treatment Plan  Patient Details  Name: Daniel EGELAND Sr. MRN: [id number] Date of Birth: Jun 11, 1940 Referring Provider:        Cardiac Rehab from 07/01/2015 in [facility] Cardiac and Pulmonary Rehab   Referring Provider  Sherren Mocha MD      Initial Encounter Date:       Cardiac Rehab from 07/01/2015 in [facility] Cardiac and Pulmonary Rehab   Referring Provider  Sherren Mocha MD      Visit Diagnosis: S/P AVR (aortic valve replacement)  Patient's Home Medications on Admission:  Current outpatient prescriptions:  .  aspirin EC 81 MG EC tablet, Take 1 tablet (81 mg total) by mouth daily., Disp: , Rfl:  .  citalopram (CELEXA) 10 MG tablet, Take by mouth., Disp: , Rfl:  .  ferrous sulfate 325 (65 FE) MG tablet, Take 1 tablet by mouth daily., Disp: , Rfl:  .  finasteride (PROSCAR) 5 MG tablet, Take by mouth., Disp: , Rfl:  .  levocetirizine (XYZAL) 5 MG tablet, Take 5 mg by mouth daily., Disp: , Rfl:  .  montelukast (SINGULAIR) 10 MG tablet, Take 10 mg by mouth at bedtime., Disp: , Rfl:  .  Multiple Vitamin (MULTI-VITAMINS) TABS, Take 1 tablet by mouth daily., Disp: , Rfl:  .  omeprazole (PRILOSEC) 20 MG capsule, Take 20 mg by mouth daily., Disp: , Rfl:  .  sildenafil (REVATIO) 20 MG tablet, , Disp: , Rfl:  .  tamsulosin (FLOMAX) 0.4 MG CAPS capsule, Take 0.4 mg by mouth daily., Disp: , Rfl:  .  vitamin C (ASCORBIC ACID) 500 MG tablet, Take 500 mg by mouth daily., Disp: , Rfl:  .  vitamin E (VITAMIN E) 400 UNIT capsule, Take 400 Units by mouth daily., Disp: , Rfl:   Past Medical History: Past Medical History  Diagnosis Date  . BPH (benign prostatic hyperplasia)   . Asthma   . Aortic stenosis     EF 50% echo 2010  . Heart murmur   . Shortness of breath dyspnea     WITH EXERTION   . GERD (gastroesophageal reflux disease)   . C. difficile colitis   . Depression     Tobacco Use: History  Smoking status  . Current Every Day Smoker -- 0.33 packs/day for 58 years  .  Types: Cigarettes  Smokeless tobacco  . Not on file    Labs: Recent Review Flowsheet Data    Labs for ITP Cardiac and Pulmonary Rehab Latest Ref Rng 11/08/2014 11/08/2014 11/08/2014 11/08/2014 11/08/2014   PHART 7.350 - 7.450 - 7.301(L) 7.317(L) 7.315(L) -   PCO2ART 35.0 - 45.0 mmHg - 54.1(H) 40.7 39.9 -   HCO3 20.0 - 24.0 mEq/L - 26.9(H) 21.0 20.5 -   TCO2 0 - 100 mmol/L 26 29 22 22 19    ACIDBASEDEF 0.0 - 2.0 mmol/L - 1.0 5.0(H) 6.0(H) -   O2SAT - - 99.0 97.0 98.0 -       Exercise Target Goals:    Exercise Program Goal: Individual exercise prescription set with THRR, safety & activity barriers. Participant demonstrates ability to understand and report RPE using BORG scale, to self-measure pulse accurately, and to acknowledge the importance of the exercise prescription.  Exercise Prescription Goal: Starting with aerobic activity 30 plus minutes a day, 3 days per week for initial exercise prescription. Provide home exercise prescription and guidelines that participant acknowledges understanding prior to discharge.  Activity Barriers & Risk Stratification:     Activity Barriers & Cardiac Risk Stratification -  04/22/15 1452    Activity Barriers & Cardiac Risk Stratification   Activity Barriers Shortness of Breath   Cardiac Risk Stratification High      6 Minute Walk:     6 Minute Walk      04/22/15 1418       6 Minute Walk   Phase Initial     Distance 1345 feet     Walk Time 6 minutes     # of Rest Breaks 0     RPE 9     Symptoms No     Resting HR 84 bpm     Resting BP 128/80 mmHg     Max Ex. HR 108 bpm     Max Ex. BP 130/60 mmHg        Initial Exercise Prescription:     Initial Exercise Prescription - 07/03/15 1000    Date of Initial Exercise RX and Referring Provider   Referring Provider Sherren Mocha MD      Perform Capillary Blood Glucose checks as needed.  Exercise Prescription Changes:     Exercise Prescription Changes      04/29/15 0647  05/29/15 0700 06/19/15 1300 06/28/15 0800 07/03/15 1000   Exercise Review   Progression Yes Yes Yes  Yes   Response to Exercise   Blood Pressure (Admit) 128/54 mmHg 120/60 mmHg 106/72 mmHg  130/82 mmHg   Blood Pressure (Exercise) 142/74 mmHg 168/88 mmHg 132/70 mmHg  132/74 mmHg   Blood Pressure (Exit) 110/62 mmHg 134/60 mmHg 94/62 mmHg  114/62 mmHg   Heart Rate (Admit) 99 bpm 73 bpm 88 bpm  76 bpm   Heart Rate (Exercise) 108 bpm 134 bpm 126 bpm  139 bpm   Heart Rate (Exit) 93 bpm 116 bpm 96 bpm  76 bpm   Rating of Perceived Exertion (Exercise) 12 14 14  13    Symptoms none none none  none   Comments Patient is just beginning program but has tolerated exercise well with no signs or symptoms during his first few exercise sessions.        Duration Progress to 30 minutes of continuous aerobic without signs/symptoms of physical distress Progress to 30 minutes of continuous aerobic without signs/symptoms of physical distress Progress to 30 minutes of continuous aerobic without signs/symptoms of physical distress  Progress to 45 minutes of aerobic exercise without signs/symptoms of physical distress   Intensity Rest + 30 Rest + 40 THRR New  111-134  THRR unchanged   Progression   Progression Continue progressive overload as per policy without signs/symptoms or physical distress. Continue progressive overload as per policy without signs/symptoms or physical distress. Continue to progress workloads to maintain intensity without signs/symptoms of physical distress.  Continue to progress workloads to maintain intensity without signs/symptoms of physical distress.   Average METs   4.5  4.5   Resistance Training   Training Prescription Yes Yes Yes  Yes   Weight 3 4 4  4    Reps 10-12 10-12 10-15  10-15   Interval Training   Interval Training  Yes Yes  Yes   Equipment  T5 Nustep T5 Nustep  Treadmill;Arm Ergometer;T5 Nustep   Comments  Intervals 2 min by 30 second. RPE should range between 13-15, watts on  T5 range 65-75.  Intervals 2 min by 30 second. RPE should range between 13-15, watts on T5 range 65-75.   Intervals 2 min by 30 second. RPE should range between 13-15, watts on T5 range 65-75.    Treadmill  MPH 2.5 3 3.2  3.2   Grade 0 5 5  9    Minutes 12 15 15  15    METs   5.44  7.4   Arm Ergometer   Level   5  5.4   Watts   33  54   Minutes   15  15   T5 Nustep   Level 5 8 8  7    Watts 45 75  Intervals: watts from 65-75 108  117   Minutes 15 20 15  10    METs   5.6  3.8   Biostep-RELP   Level 3 --      Watts 40 --      Minutes 15 --      Home Exercise Plan   Plans to continue exercise at    --  Daniel Kennedy has [employer] and will consider the use of the Ind       Exercise Comments:     Exercise Comments      04/29/15 1201 04/29/15 1202 05/27/15 1223 05/29/15 0706 05/31/15 1013   Exercise Comments Daniel Kennedy has  Seiya has just begun the program and is tolerating exercise well with no symptoms. He had some difficulty on his first day walking on the TM only due to his gate and never being on a TM before. He was able to get more comfortable on the machine during his next exercise session and was able to walk  the  intensity and duration of his exercise prescription.  Daniel Kennedy stated that he is feeling stronger and able to do more daily activities. He enjoys golf and is finally going to attempt to play tomorrow (05/28/15) with a group of seniors. He has not played since August 2016.  Reviewed individualized exercise prescription and made increases per departmental policy. Exercise increases were discussed with the patient and they were able to perform the new work loads without issue (no signs or symptoms).  Interval training reommendations and benefits discussed with patient.  Rigel was able to go golfing this week for the first time in a while. He tolerated it well physically and enjoyed his time.      06/19/15 1319 06/28/15 0844 07/03/15 1101       Exercise Comments Daniel Kennedy has continued to do well  with exercise progression.  He has been out of town for the holiday weekend. Will continue to monitor progress. Daniel Kennedy has [employer] and will consider the use of the [employer] since it is free to him wiht [employer].  Daniel Kennedy has been working hard on his intervals.  He will be doing his post 6 min walk test.  We wil continue to talk to him about joining the [employer] using his [place].        Discharge Exercise Prescription (Final Exercise Prescription Changes):     Exercise Prescription Changes - 07/03/15 1000    Exercise Review   Progression Yes   Response to Exercise   Blood Pressure (Admit) 130/82 mmHg   Blood Pressure (Exercise) 132/74 mmHg   Blood Pressure (Exit) 114/62 mmHg   Heart Rate (Admit) 76 bpm   Heart Rate (Exercise) 139 bpm   Heart Rate (Exit) 76 bpm   Rating of Perceived Exertion (Exercise) 13   Symptoms none   Duration Progress to 45 minutes of aerobic exercise without signs/symptoms of physical distress   Intensity THRR unchanged   Progression   Progression Continue to progress workloads to maintain intensity  without signs/symptoms of physical distress.   Average METs 4.5   Resistance Training   Training Prescription Yes   Weight 4   Reps 10-15   Interval Training   Interval Training Yes   Equipment Treadmill;Arm Ergometer;T5 Nustep   Comments Intervals 2 min by 30 second. RPE should range between 13-15, watts on T5 range 65-75.    Treadmill   MPH 3.2   Grade 9   Minutes 15   METs 7.4   Arm Ergometer   Level 5.4   Watts 54   Minutes 15   T5 Nustep   Level 7   Watts 117   Minutes 10   METs 3.8      Nutrition:  Target Goals: Understanding of nutrition guidelines, daily intake of sodium 1500mg , cholesterol 200mg , calories 30% from fat and 7% or less from saturated fats, daily to have 5 or more servings of fruits and vegetables.  Biometrics:     Pre Biometrics - 04/22/15 1424    Pre Biometrics   Height 6' 0.9" (1.852 m)    Weight 158 lb 3.2 oz (71.759 kg)   Waist Circumference 34.75 inches   Hip Circumference 40 inches   Waist to Hip Ratio 0.87 %   BMI (Calculated) 21       Nutrition Therapy Plan and Nutrition Goals:     Nutrition Therapy & Goals - 04/22/15 1501    Personal Nutrition Goals   Personal Goal #1 To cont to eat healthy "I feel like I eat healthy so I don't see the need to meet individually with the CR Registered Dietician.    Intervention Plan   Intervention Prescribe, educate and counsel regarding individualized specific dietary modifications aiming towards targeted core components such as weight, hypertension, lipid management, diabetes, heart failure and other comorbidities.   Expected Outcomes Short Term Goal: Understand basic principles of dietary content, such as calories, fat, sodium, cholesterol and nutrients.;Long Term Goal: Adherence to prescribed nutrition plan.      Nutrition Discharge: Rate Your Plate Scores:   Nutrition Goals Re-Evaluation:     Nutrition Goals Re-Evaluation      04/29/15 0952 06/28/15 [id number]         Personal Goal #1 Re-Evaluation   Goal Progress Seen Yes       Comments Daniel Kennedy drinks 16 oz a day plus one fruit at least.  Daniel Kennedy reported today when asked if he wanted to meet individually with the REgistered Dietician "what for. My primary care MD told me to keep doing what I am doing. My cholesterol has never been above 200 and my good cholestrol is 40 and exercise will help with that and the LDL has always been low.         Psychosocial: Target Goals: Acknowledge presence or absence of depression, maximize coping skills, provide positive support system. Participant is able to verbalize types and ability to use techniques and skills needed for reducing stress and depression.  Initial Review & Psychosocial Screening:     Initial Psych Review & Screening - 04/22/15 1504    Family Dynamics   Good Support System? Yes   Barriers   Psychosocial barriers to  participate in program There are no identifiable barriers or psychosocial needs.   Screening Interventions   Interventions Encouraged to exercise      Quality of Life Scores:   PHQ-9:     Recent Review Flowsheet Data    Depression screen [facility] 2/9 04/22/2015   Decreased Interest 1  Down, Depressed, Hopeless 0   PHQ - 2 Score 1   Altered sleeping 1   Tired, decreased energy 2   Change in appetite 0   Feeling bad or failure about yourself  0   Trouble concentrating 0   Moving slowly or fidgety/restless 0   Suicidal thoughts 0   PHQ-9 Score 4   Difficult doing work/chores Somewhat difficult      Psychosocial Evaluation and Intervention:     Psychosocial Evaluation - 07/08/15 0912    Discharge Psychosocial Assessment & Intervention   Comments Follow with Daniel Kennedy today stating he just "got a bill from this program" and may not be able to continue exercising here as he was unaware his insurance may not cover all of the sessions and he can't afford to keep coming.  He reports this program has accomplished the purpose for him to increase his stamina and strength and get his energy levels back up.  He states his mood is good and he plans to continue exercising by walking and playing golf.  Counselor commended Daniel Kennedy for his hard work and commitment to exercise.  He is going to try to see if the insurance issues can be worked out and he can continue this program as he states he has enjoyed it and its benefits.        Psychosocial Re-Evaluation:     Psychosocial Re-Evaluation      05/20/15 0958 06/28/15 0813         Psychosocial Re-Evaluation   Comments Counselor follow up with Daniel Kennedy reporting feeling increased energy since coming into this program.  He states his mood has improved as well and when asked about the medications, he reports he is taking daily, but still "can't tell any difference."  Daniel Kennedy attributes the improvement in mood to the consistent exercise and  plans to speak to his doctor about discontinuing the medications.  He also reports now that the weather has improved he feels better overall and is able to get outside more.  Counselor will continue to follow with Daniel Kennedy in the future. He belongs to [employer] and plays golf once a member $35/month. He is able to play gold once a month at least now. Daniel Kennedy said he is doing ok off the medicine that he talked to Daniel Kennedy about since he is exercising more and now he can work in his yard also.          Vocational Rehabilitation: Provide vocational rehab assistance to qualifying candidates.   Vocational Rehab Evaluation & Intervention:     Vocational Rehab - 04/22/15 1455    Initial Vocational Rehab Evaluation & Intervention   Assessment shows need for Vocational Rehabilitation No      Education: Education Goals: Education classes will be provided on a weekly basis, covering required topics. Participant will state understanding/return demonstration of topics presented.  Learning Barriers/Preferences:     Learning Barriers/Preferences - 04/22/15 1453    Learning Barriers/Preferences   Learning Barriers Hearing   Learning Preferences None      Education Topics: General Nutrition Guidelines/Fats and Fiber: -Group instruction provided by verbal, written material, models and posters to present the general guidelines for heart healthy nutrition. Gives an explanation and review of dietary fats and fiber.          Cardiac Rehab from 07/08/2015 in [facility] Cardiac and Pulmonary Rehab   Date  06/24/15   Educator  Jaclyn Shaggy   Instruction Review Code  2- meets goals/outcomes      Controlling Sodium/Reading Food Labels: -Group verbal and written material supporting the discussion of sodium use in heart healthy nutrition. Review and explanation with models, verbal and written materials for utilization of the food label.      Cardiac Rehab from 07/08/2015 in [facility] Cardiac and Pulmonary  Rehab   Date  07/01/15   Educator  C. Joneen Caraway, RD   Instruction Review Code  R- Review/reinforce [Second Class]      Exercise Physiology & Risk Factors: - Group verbal and written instruction with models to review the exercise physiology of the cardiovascular system and associated critical values. Details cardiovascular disease risk factors and the goals associated with each risk factor.   Aerobic Exercise & Resistance Training: - Gives group verbal and written discussion on the health impact of inactivity. On the components of aerobic and resistive training programs and the benefits of this training and how to safely progress through these programs.      Cardiac Rehab from 07/08/2015 in [facility] Cardiac and Pulmonary Rehab   Date  07/08/15   Educator  [facility]   Instruction Review Code  R- Review/reinforce [Second Class]      Flexibility, Balance, General Exercise Guidelines: - Provides group verbal and written instruction on the benefits of flexibility and balance training programs. Provides general exercise guidelines with specific guidelines to those with heart or lung disease. Demonstration and skill practice provided.   Stress Management: - Provides group verbal and written instruction about the health risks of elevated stress, cause of high stress, and healthy ways to reduce stress.   Depression: - Provides group verbal and written instruction on the correlation between heart/lung disease and depressed mood, treatment options, and the stigmas associated with seeking treatment.   Anatomy & Physiology of the Heart: - Group verbal and written instruction and models provide basic cardiac anatomy and physiology, with the coronary electrical and arterial systems. Review of: AMI, Angina, Valve disease, Heart Failure, Cardiac Arrhythmia, Pacemakers, and the ICD.      Cardiac Rehab from 07/08/2015 in [facility] Cardiac and Pulmonary Rehab   Date  05/20/15   Educator  SB   Instruction Review Code  2-  meets goals/outcomes      Cardiac Procedures: - Group verbal and written instruction and models to describe the testing methods done to diagnose heart disease. Reviews the outcomes of the test results. Describes the treatment choices: Medical Management, Angioplasty, or Coronary Bypass Surgery.      Cardiac Rehab from 07/08/2015 in [facility] Cardiac and Pulmonary Rehab   Date  05/27/15   Educator  CE   Instruction Review Code  2- meets goals/outcomes      Cardiac Medications: - Group verbal and written instruction to review commonly prescribed medications for heart disease. Reviews the medication, class of the drug, and side effects. Includes the steps to properly store meds and maintain the prescription regimen.      Cardiac Rehab from 07/08/2015 in [facility] Cardiac and Pulmonary Rehab   Date  06/03/15   Educator  SB   Instruction Review Code  2- meets goals/outcomes      Go Sex-Intimacy & Heart Disease, Get SMART - Goal Setting: - Group verbal and written instruction through game format to discuss heart disease and the return to sexual intimacy. Provides group verbal and written material to discuss and apply goal setting through the application of the S.M.A.R.T. Method.      Cardiac Rehab from 07/08/2015 in [facility] Cardiac  and Pulmonary Rehab   Date  05/27/15   Educator  CE   Instruction Review Code  2- meets goals/outcomes      Other Matters of the Heart: - Provides group verbal, written materials and models to describe Heart Failure, Angina, Valve Disease, and Diabetes in the realm of heart disease. Includes description of the disease process and treatment options available to the cardiac patient.      Cardiac Rehab from 07/08/2015 in [facility] Cardiac and Pulmonary Rehab   Date  05/20/15   Educator  SB   Instruction Review Code  2- meets goals/outcomes      Exercise & Equipment Safety: - Individual verbal instruction and demonstration of equipment use and safety with use of the equipment.       Cardiac Rehab from 07/08/2015 in [facility] Cardiac and Pulmonary Rehab   Date  04/26/15   Educator  C. EnterkinRN   Instruction Review Code  1- partially meets, needs review/practice [Narada almost fell on the treadmill so we got him on the [place]]      Infection Prevention: - Provides verbal and written material to individual with discussion of infection control including proper hand washing and proper equipment cleaning during exercise session.      Cardiac Rehab from 04/22/2015 in [facility] Cardiac and Pulmonary Rehab   Date  04/22/15   Educator  C. EnterkinRN   Instruction Review Code  2- meets goals/outcomes      Falls Prevention: - Provides verbal and written material to individual with discussion of falls prevention and safety.      Cardiac Rehab from 04/22/2015 in [facility] Cardiac and Pulmonary Rehab   Date  04/22/15   Educator  C. Kennedy   Instruction Review Code  2- meets goals/outcomes      Diabetes: - Individual verbal and written instruction to review signs/symptoms of diabetes, desired ranges of glucose level fasting, after meals and with exercise. Advice that pre and post exercise glucose checks will be done for 3 sessions at entry of program.    Knowledge Questionnaire Score:   Core Components/Risk Factors/Patient Goals at Admission:     Personal Goals and Risk Factors at Admission - 04/22/15 1502    Core Components/Risk Factors/Patient Goals on Admission   Sedentary Yes   Intervention Provide advice, education, support and counseling about physical activity/exercise needs.;Develop an individualized exercise prescription for aerobic and resistive training based on initial evaluation findings, risk stratification, comorbidities and participant's personal goals.   Expected Outcomes Achievement of increased cardiorespiratory fitness and enhanced flexibility, muscular endurance and strength shown through measurements of functional capacity and personal statement of participant.    Increase Strength and Stamina Yes   Intervention Provide advice, education, support and counseling about physical activity/exercise needs.;Develop an individualized exercise prescription for aerobic and resistive training based on initial evaluation findings, risk stratification, comorbidities and participant's personal goals.  "I really want to get back to be able to get back on the golf course.   Expected Outcomes Achievement of increased cardiorespiratory fitness and enhanced flexibility, muscular endurance and strength shown through measurements of functional capacity and personal statement of participant.   Tobacco Cessation Yes   Number of packs per day 0.25   Intervention Assist the participant in steps to quit. Provide individualized education and counseling about committing to Tobacco Cessation, relapse prevention, and pharmacological support that can be provided by physician.;Advice worker, assist with locating and accessing local/national Quit Smoking programs, and support quit date choice.   Expected Outcomes  Short Term: Will quit all tobacco product use, adhering to prevention of relapse plan.;Long Term: Complete abstinence from all tobacco products for at least 12 months from quit date.   Intervention Provide education, individualized exercise plan and daily activity instruction to help decrease symptoms of SOB with activities of daily living.   Expected Outcomes Short Term: Achieves a reduction of symptoms when performing activities of daily living.   Develop more efficient breathing techniques such as purse lipped breathing and diaphragmatic breathing; and practicing self-pacing with activity Yes      Core Components/Risk Factors/Patient Goals Review:      Goals and Risk Factor Review      04/26/15 0927 04/29/15 0949 05/03/15 0852 05/27/15 1213 06/28/15 0811   Core Components/Risk Factors/Patient Goals Review   Personal Goals Review Sedentary Sedentary Tobacco  Cessation Sedentary Sedentary   Review Daniel Kennedy said he wanted to do more today but we instructed him first day to keep Rate of Perceived exertion at 11-12.  Daniel Kennedy said he has been able to work out in his yard some which was his goal. Daniel Kennedy had 6 cigarettes in the past 24 hours. Daniel Kennedy reports he really doesn't see the need to quit smoking since what he heard from his doctor that his heart problem didn't have  any thing to do with his lifestyle. Daniel Kennedy states that he is not ready to quit smoking.  He had valve disease and no cardiac risk factors except the tobacco use.  He stated that when he wants to quit, he will be able to readdily quit.  Daniel Kennedy stated that he is feeling stronger and able to do more daily activities. He enjoys golf and is finally going to attempt to play tomorrow (05/28/15) with a group of seniors. He has not played since August 2016.  Playing Golf has gone well and he will play next Tuesday also. He was able to work out in his yard alot yesterday dividing his iris. He said he has 1/2 of this exercise room size of iris plants which he started with 6 plants.    Expected Outcomes Goal to increase his exercise ability.    Short term: Daniel Kennedy decides to quit smoking and is able to quit and maintain.cessation Demarious will be able to tolerate golfing again since he is increasing his strengh and stamina from consistant exercise.       06/28/15 0818           Core Components/Risk Factors/Patient Goals Review   Review Bennet reported today when asked if he wanted to meet individually with the REgistered Dietician "what for. My primary care MD told me to keep doing what I am doing. My cholesterol has never been above 200 and my good cholestrol is 40 and exercise will help with that and the LDL has always been low.  Howell is playing golf and worked on dividing his iris in his yard yesterday.        Expected Outcomes Cont to play golf and work in his yard and get aerobic exercise.           Core Components/Risk  Factors/Patient Goals at Discharge (Final Review):      Goals and Risk Factor Review - 06/28/15 0818    Core Components/Risk Factors/Patient Goals Review   Review Johnte reported today when asked if he wanted to meet individually with the REgistered Dietician "what for. My primary care MD told me to keep doing what I am doing. My cholesterol has never been above 200  and my good cholestrol is 40 and exercise will help with that and the LDL has always been low.  Jeyden is playing golf and worked on dividing his iris in his yard yesterday.    Expected Outcomes Cont to play golf and work in his yard and get aerobic exercise.       ITP Comments:     ITP Comments      04/26/15 0926 05/12/15 0938 06/09/15 1206 06/28/15 0819 06/28/15 0845   ITP Comments [place]'s first day and he admitted that he has never been on a treadmill before. He almost fell so we changed him to Nustep for now. After class I got on a treadmill and explained it to him.  30 day review.  Continue with ITP  has attended 6 sessions , new to program 30 day review. Continue with ITP Vance is playing golf and worked on dividing his iris in his yard yesterday Maddyn has [place] and will consider the use of the [place] since it is free to him wiht [employer].      07/10/15 0715           ITP Comments 30 day review.  Continue with ITP          Comments:

## 2015-07-17 ENCOUNTER — Encounter: Payer: PPO | Admitting: *Deleted

## 2015-07-17 NOTE — Patient Instructions (Signed)
Discharge Instructions  Patient Details  Name: Daniel BERTAGNOLLI Sr. MRN: [id number] Date of Birth: 14-May-1940 Referring Provider:  Tracie Harrier, MD   Number of Visits:   Reason for Discharge:  Patient reached a stable level of exercise. Patient independent in their exercise.  Smoking History:  History  Smoking status  . Current Every Day Smoker -- 0.33 packs/day for 58 years  . Types: Cigarettes  Smokeless tobacco  . Not on file    Diagnosis:  No diagnosis found.  Initial Exercise Prescription:     Initial Exercise Prescription - 07/03/15 1000    Date of Initial Exercise RX and Referring Provider   Referring Provider Sherren Mocha MD      Discharge Exercise Prescription (Final Exercise Prescription Changes):     Exercise Prescription Changes - 07/03/15 1000    Exercise Review   Progression Yes   Response to Exercise   Blood Pressure (Admit) 130/82 mmHg   Blood Pressure (Exercise) 132/74 mmHg   Blood Pressure (Exit) 114/62 mmHg   Heart Rate (Admit) 76 bpm   Heart Rate (Exercise) 139 bpm   Heart Rate (Exit) 76 bpm   Rating of Perceived Exertion (Exercise) 13   Symptoms none   Duration Progress to 45 minutes of aerobic exercise without signs/symptoms of physical distress   Intensity THRR unchanged   Progression   Progression Continue to progress workloads to maintain intensity without signs/symptoms of physical distress.   Average METs 4.5   Resistance Training   Training Prescription Yes   Weight 4   Reps 10-15   Interval Training   Interval Training Yes   Equipment Treadmill;Arm Ergometer;T5 Nustep   Comments Intervals 2 min by 30 second. RPE should range between 13-15, watts on T5 range 65-75.    Treadmill   MPH 3.2   Grade 9   Minutes 15   METs 7.4   Arm Ergometer   Level 5.4   Watts 54   Minutes 15   T5 Nustep   Level 7   Watts 117   Minutes 10   METs 3.8      Functional Capacity:     6 Minute Walk      04/22/15 1418       6  Minute Walk   Phase Initial     Distance 1345 feet     Walk Time 6 minutes     # of Rest Breaks 0     RPE 9     Symptoms No     Resting HR 84 bpm     Resting BP 128/80 mmHg     Max Ex. HR 108 bpm     Max Ex. BP 130/60 mmHg        Quality of Life:   Personal Goals: Goals established at orientation with interventions provided to work toward goal.     Personal Goals and Risk Factors at Admission - 04/22/15 1502    Core Components/Risk Factors/Patient Goals on Admission   Sedentary Yes   Intervention Provide advice, education, support and counseling about physical activity/exercise needs.;Develop an individualized exercise prescription for aerobic and resistive training based on initial evaluation findings, risk stratification, comorbidities and participant's personal goals.   Expected Outcomes Achievement of increased cardiorespiratory fitness and enhanced flexibility, muscular endurance and strength shown through measurements of functional capacity and personal statement of participant.   Increase Strength and Stamina Yes   Intervention Provide advice, education, support and counseling about physical activity/exercise needs.;Develop an individualized exercise prescription  for aerobic and resistive training based on initial evaluation findings, risk stratification, comorbidities and participant's personal goals.  "I really want to get back to be able to get back on the golf course.   Expected Outcomes Achievement of increased cardiorespiratory fitness and enhanced flexibility, muscular endurance and strength shown through measurements of functional capacity and personal statement of participant.   Tobacco Cessation Yes   Number of packs per day 0.25   Intervention Assist the participant in steps to quit. Provide individualized education and counseling about committing to Tobacco Cessation, relapse prevention, and pharmacological support that can be provided by physician.;Ship broker, assist with locating and accessing local/national Quit Smoking programs, and support quit date choice.   Expected Outcomes Short Term: Will quit all tobacco product use, adhering to prevention of relapse plan.;Long Term: Complete abstinence from all tobacco products for at least 12 months from quit date.   Intervention Provide education, individualized exercise plan and daily activity instruction to help decrease symptoms of SOB with activities of daily living.   Expected Outcomes Short Term: Achieves a reduction of symptoms when performing activities of daily living.   Develop more efficient breathing techniques such as purse lipped breathing and diaphragmatic breathing; and practicing self-pacing with activity Yes       Personal Goals Discharge:     Goals and Risk Factor Review - 06/28/15 0818    Core Components/Risk Factors/Patient Goals Review   Review Daniel Kennedy reported today when asked if he wanted to meet individually with the REgistered Dietician "what for. My primary care MD told me to keep doing what I am doing. My cholesterol has never been above 200 and my good cholestrol is 40 and exercise will help with that and the LDL has always been low.  Daniel Kennedy is playing golf and worked on dividing his iris in his yard yesterday.    Expected Outcomes Cont to play golf and work in his yard and get aerobic exercise.       Nutrition & Weight - Outcomes:     Pre Biometrics - 04/22/15 1424    Pre Biometrics   Height 6' 0.9" (1.852 m)   Weight 158 lb 3.2 oz (71.759 kg)   Waist Circumference 34.75 inches   Hip Circumference 40 inches   Waist to Hip Ratio 0.87 %   BMI (Calculated) 21       Nutrition:     Nutrition Therapy & Goals - 04/22/15 1501    Personal Nutrition Goals   Personal Goal #1 To cont to eat healthy "I feel like I eat healthy so I don't see the need to meet individually with the [facility] Registered Dietician.    Intervention Plan   Intervention Prescribe,  educate and counsel regarding individualized specific dietary modifications aiming towards targeted core components such as weight, hypertension, lipid management, diabetes, heart failure and other comorbidities.   Expected Outcomes Short Term Goal: Understand basic principles of dietary content, such as calories, fat, sodium, cholesterol and nutrients.;Long Term Goal: Adherence to prescribed nutrition plan.      Nutrition Discharge:   Education Questionnaire Score:   Goals reviewed with patient; copy given to patient.

## 2015-07-22 ENCOUNTER — Encounter: Payer: PPO | Attending: Cardiovascular Disease

## 2015-07-22 DIAGNOSIS — Z952 Presence of prosthetic heart valve: Secondary | ICD-10-CM | POA: Insufficient documentation

## 2015-07-31 ENCOUNTER — Encounter: Payer: Self-pay | Admitting: *Deleted

## 2015-07-31 DIAGNOSIS — Z952 Presence of prosthetic heart valve: Secondary | ICD-10-CM

## 2015-08-07 ENCOUNTER — Encounter: Payer: Self-pay | Admitting: *Deleted

## 2015-08-07 DIAGNOSIS — Z952 Presence of prosthetic heart valve: Secondary | ICD-10-CM

## 2015-08-07 NOTE — Progress Notes (Signed)
Cardiac Individual Treatment Plan  Patient Details  Name: Daniel Kennedy Sr. MRN: [id number] Date of Birth: January 03, 1941 Referring Provider:        Cardiac Rehab from 07/01/2015 in [facility] Cardiac and Pulmonary Rehab   Referring Provider  Sherren Mocha MD      Initial Encounter Date:       Cardiac Rehab from 07/01/2015 in [facility] Cardiac and Pulmonary Rehab   Referring Provider  Sherren Mocha MD      Visit Diagnosis: S/P AVR (aortic valve replacement)  Patient's Home Medications on Admission:  Current outpatient prescriptions:  .  aspirin EC 81 MG EC tablet, Take 1 tablet (81 mg total) by mouth daily., Disp: , Rfl:  .  citalopram (CELEXA) 10 MG tablet, Take by mouth., Disp: , Rfl:  .  ferrous sulfate 325 (65 FE) MG tablet, Take 1 tablet by mouth daily., Disp: , Rfl:  .  finasteride (PROSCAR) 5 MG tablet, Take by mouth., Disp: , Rfl:  .  levocetirizine (XYZAL) 5 MG tablet, Take 5 mg by mouth daily., Disp: , Rfl:  .  montelukast (SINGULAIR) 10 MG tablet, Take 10 mg by mouth at bedtime., Disp: , Rfl:  .  Multiple Vitamin (MULTI-VITAMINS) TABS, Take 1 tablet by mouth daily., Disp: , Rfl:  .  omeprazole (PRILOSEC) 20 MG capsule, Take 20 mg by mouth daily., Disp: , Rfl:  .  sildenafil (REVATIO) 20 MG tablet, , Disp: , Rfl:  .  tamsulosin (FLOMAX) 0.4 MG CAPS capsule, Take 0.4 mg by mouth daily., Disp: , Rfl:  .  vitamin C (ASCORBIC ACID) 500 MG tablet, Take 500 mg by mouth daily., Disp: , Rfl:  .  vitamin E (VITAMIN E) 400 UNIT capsule, Take 400 Units by mouth daily., Disp: , Rfl:   Past Medical History: Past Medical History  Diagnosis Date  . BPH (benign prostatic hyperplasia)   . Asthma   . Aortic stenosis     EF 50% echo 2010  . Heart murmur   . Shortness of breath dyspnea     WITH EXERTION   . GERD (gastroesophageal reflux disease)   . C. difficile colitis   . Depression     Tobacco Use: History  Smoking status  . Current Every Day Smoker -- 0.33 packs/day for 58 years  .  Types: Cigarettes  Smokeless tobacco  . Not on file    Labs: Recent Review Flowsheet Data    Labs for ITP Cardiac and Pulmonary Rehab Latest Ref Rng 11/08/2014 11/08/2014 11/08/2014 11/08/2014 11/08/2014   PHART 7.350 - 7.450 - 7.301(L) 7.317(L) 7.315(L) -   PCO2ART 35.0 - 45.0 mmHg - 54.1(H) 40.7 39.9 -   HCO3 20.0 - 24.0 mEq/L - 26.9(H) 21.0 20.5 -   TCO2 0 - 100 mmol/L 26 29 22 22 19    ACIDBASEDEF 0.0 - 2.0 mmol/L - 1.0 5.0(H) 6.0(H) -   O2SAT - - 99.0 97.0 98.0 -       Exercise Target Goals:    Exercise Program Goal: Individual exercise prescription set with THRR, safety & activity barriers. Participant demonstrates ability to understand and report RPE using BORG scale, to self-measure pulse accurately, and to acknowledge the importance of the exercise prescription.  Exercise Prescription Goal: Starting with aerobic activity 30 plus minutes a day, 3 days per week for initial exercise prescription. Provide home exercise prescription and guidelines that participant acknowledges understanding prior to discharge.  Activity Barriers & Risk Stratification:     Activity Barriers & Cardiac Risk Stratification -  04/22/15 1452    Activity Barriers & Cardiac Risk Stratification   Activity Barriers Shortness of Breath   Cardiac Risk Stratification High      6 Minute Walk:     6 Minute Walk      04/22/15 1418       6 Minute Walk   Phase Initial     Distance 1345 feet     Walk Time 6 minutes     # of Rest Breaks 0     RPE 9     Symptoms No     Resting HR 84 bpm     Resting BP 128/80 mmHg     Max Ex. HR 108 bpm     Max Ex. BP 130/60 mmHg        Initial Exercise Prescription:     Initial Exercise Prescription - 07/03/15 1000    Date of Initial Exercise RX and Referring Provider   Referring Provider Sherren Mocha MD      Perform Capillary Blood Glucose checks as needed.  Exercise Prescription Changes:     Exercise Prescription Changes      04/29/15 0647  05/29/15 0700 06/19/15 1300 06/28/15 0800 07/03/15 1000   Exercise Review   Progression Yes Yes Yes  Yes   Response to Exercise   Blood Pressure (Admit) 128/54 mmHg 120/60 mmHg 106/72 mmHg  130/82 mmHg   Blood Pressure (Exercise) 142/74 mmHg 168/88 mmHg 132/70 mmHg  132/74 mmHg   Blood Pressure (Exit) 110/62 mmHg 134/60 mmHg 94/62 mmHg  114/62 mmHg   Heart Rate (Admit) 99 bpm 73 bpm 88 bpm  76 bpm   Heart Rate (Exercise) 108 bpm 134 bpm 126 bpm  139 bpm   Heart Rate (Exit) 93 bpm 116 bpm 96 bpm  76 bpm   Rating of Perceived Exertion (Exercise) 12 14 14  13    Symptoms none none none  none   Comments Patient is just beginning program but has tolerated exercise well with no signs or symptoms during his first few exercise sessions.        Duration Progress to 30 minutes of continuous aerobic without signs/symptoms of physical distress Progress to 30 minutes of continuous aerobic without signs/symptoms of physical distress Progress to 30 minutes of continuous aerobic without signs/symptoms of physical distress  Progress to 45 minutes of aerobic exercise without signs/symptoms of physical distress   Intensity Rest + 30 Rest + 40 THRR New  111-134  THRR unchanged   Progression   Progression Continue progressive overload as per policy without signs/symptoms or physical distress. Continue progressive overload as per policy without signs/symptoms or physical distress. Continue to progress workloads to maintain intensity without signs/symptoms of physical distress.  Continue to progress workloads to maintain intensity without signs/symptoms of physical distress.   Average METs   4.5  4.5   Resistance Training   Training Prescription Yes Yes Yes  Yes   Weight 3 4 4  4    Reps 10-12 10-12 10-15  10-15   Interval Training   Interval Training  Yes Yes  Yes   Equipment  T5 Nustep T5 Nustep  Treadmill;Arm Ergometer;T5 Nustep   Comments  Intervals 2 min by 30 second. RPE should range between 13-15, watts on  T5 range 65-75.  Intervals 2 min by 30 second. RPE should range between 13-15, watts on T5 range 65-75.   Intervals 2 min by 30 second. RPE should range between 13-15, watts on T5 range 65-75.    Treadmill  MPH 2.5 3 3.2  3.2   Grade 0 5 5  9    Minutes 12 15 15  15    METs   5.44  7.4   Arm Ergometer   Level   5  5.4   Watts   33  54   Minutes   15  15   T5 Nustep   Level 5 8 8  7    Watts 45 75  Intervals: watts from 65-75 108  117   Minutes 15 20 15  10    METs   5.6  3.8   Biostep-RELP   Level 3 --      Watts 40 --      Minutes 15 --      Home Exercise Plan   Plans to continue exercise at    --  [place] has [employer] and will consider the use of the Ind       Exercise Comments:     Exercise Comments      04/29/15 1201 04/29/15 1202 05/27/15 1223 05/29/15 0706 05/31/15 1013   Exercise Comments Angelos has  Ogle has just begun the program and is tolerating exercise well with no symptoms. He had some difficulty on his first day walking on the TM only due to his gate and never being on a TM before. He was able to get more comfortable on the machine during his next exercise session and was able to walk  the  intensity and duration of his exercise prescription.  Onel stated that he is feeling stronger and able to do more daily activities. He enjoys golf and is finally going to attempt to play tomorrow (05/28/15) with a group of seniors. He has not played since August 2016.  Reviewed individualized exercise prescription and made increases per departmental policy. Exercise increases were discussed with the patient and they were able to perform the new work loads without issue (no signs or symptoms).  Interval training reommendations and benefits discussed with patient.  Kalique was able to go golfing this week for the first time in a while. He tolerated it well physically and enjoyed his time.      06/19/15 1319 06/28/15 0844 07/03/15 1101 07/18/15 0847 07/31/15 1619   Exercise Comments Tahari has  continued to do well with exercise progression.  He has been out of town for the holiday weekend. Will continue to monitor progress. [place] has [employer] and will consider the use of the [employer] since it is free to him wiht [employer].  Olamide has been working hard on his intervals.  He will be doing his post 6 min walk test.  We wil continue to talk to him about joining the [employer] using his [place]. Pirkle last visit was 07/08/15 Pt has been out since last review.      Discharge Exercise Prescription (Final Exercise Prescription Changes):     Exercise Prescription Changes - 07/03/15 1000    Exercise Review   Progression Yes   Response to Exercise   Blood Pressure (Admit) 130/82 mmHg   Blood Pressure (Exercise) 132/74 mmHg   Blood Pressure (Exit) 114/62 mmHg   Heart Rate (Admit) 76 bpm   Heart Rate (Exercise) 139 bpm   Heart Rate (Exit) 76 bpm   Rating of Perceived Exertion (Exercise) 13   Symptoms none   Duration Progress to 45 minutes of aerobic exercise without signs/symptoms of physical distress   Intensity THRR unchanged   Progression  Progression Continue to progress workloads to maintain intensity without signs/symptoms of physical distress.   Average METs 4.5   Resistance Training   Training Prescription Yes   Weight 4   Reps 10-15   Interval Training   Interval Training Yes   Equipment Treadmill;Arm Ergometer;T5 Nustep   Comments Intervals 2 min by 30 second. RPE should range between 13-15, watts on T5 range 65-75.    Treadmill   MPH 3.2   Grade 9   Minutes 15   METs 7.4   Arm Ergometer   Level 5.4   Watts 54   Minutes 15   T5 Nustep   Level 7   Watts 117   Minutes 10   METs 3.8      Nutrition:  Target Goals: Understanding of nutrition guidelines, daily intake of sodium 1500mg , cholesterol 200mg , calories 30% from fat and 7% or less from saturated fats, daily to have 5 or more servings of fruits and vegetables.  Biometrics:      Pre Biometrics - 04/22/15 1424    Pre Biometrics   Height 6' 0.9" (1.852 m)   Weight 158 lb 3.2 oz (71.759 kg)   Waist Circumference 34.75 inches   Hip Circumference 40 inches   Waist to Hip Ratio 0.87 %   BMI (Calculated) 21       Nutrition Therapy Plan and Nutrition Goals:     Nutrition Therapy & Goals - 04/22/15 1501    Personal Nutrition Goals   Personal Goal #1 To cont to eat healthy "I feel like I eat healthy so I don't see the need to meet individually with the [facility] Registered Dietician.    Intervention Plan   Intervention Prescribe, educate and counsel regarding individualized specific dietary modifications aiming towards targeted core components such as weight, hypertension, lipid management, diabetes, heart failure and other comorbidities.   Expected Outcomes Short Term Goal: Understand basic principles of dietary content, such as calories, fat, sodium, cholesterol and nutrients.;Long Term Goal: Adherence to prescribed nutrition plan.      Nutrition Discharge: Rate Your Plate Scores:   Nutrition Goals Re-Evaluation:     Nutrition Goals Re-Evaluation      04/29/15 0952 06/28/15 [id number]         Personal Goal #1 Re-Evaluation   Goal Progress Seen Yes       Comments Lonzy drinks 16 oz a day plus one fruit at least.  Timothy reported today when asked if he wanted to meet individually with the REgistered Dietician "what for. My primary care MD told me to keep doing what I am doing. My cholesterol has never been above 200 and my good cholestrol is 40 and exercise will help with that and the LDL has always been low.         Psychosocial: Target Goals: Acknowledge presence or absence of depression, maximize coping skills, provide positive support system. Participant is able to verbalize types and ability to use techniques and skills needed for reducing stress and depression.  Initial Review & Psychosocial Screening:     Initial Psych Review & Screening - 04/22/15 1504     Family Dynamics   Good Support System? Yes   Barriers   Psychosocial barriers to participate in program There are no identifiable barriers or psychosocial needs.   Screening Interventions   Interventions Encouraged to exercise      Quality of Life Scores:   PHQ-9:     Recent Review Flowsheet Data    Depression screen [facility] 2/9  04/22/2015   Decreased Interest 1   Down, Depressed, Hopeless 0   PHQ - 2 Score 1   Altered sleeping 1   Tired, decreased energy 2   Change in appetite 0   Feeling bad or failure about yourself  0   Trouble concentrating 0   Moving slowly or fidgety/restless 0   Suicidal thoughts 0   PHQ-9 Score 4   Difficult doing work/chores Somewhat difficult      Psychosocial Evaluation and Intervention:     Psychosocial Evaluation - 07/08/15 0912    Discharge Psychosocial Assessment & Intervention   Comments Follow with Mr. Tsou today stating he just "got a bill from this program" and may not be able to continue exercising here as he was unaware his insurance may not cover all of the sessions and he can't afford to keep coming.  He reports this program has accomplished the purpose for him to increase his stamina and strength and get his energy levels back up.  He states his mood is good and he plans to continue exercising by walking and playing golf.  Counselor commended Mr. Simmonds for his hard work and commitment to exercise.  He is going to try to see if the insurance issues can be worked out and he can continue this program as he states he has enjoyed it and its benefits.        Psychosocial Re-Evaluation:     Psychosocial Re-Evaluation      05/20/15 0958 06/28/15 0813         Psychosocial Re-Evaluation   Comments Counselor follow up with Mr. Frasier reporting feeling increased energy since coming into this program.  He states his mood has improved as well and when asked about the medications, he reports he is taking daily, but still "can't tell any  difference."  Mr. Nivar attributes the improvement in mood to the consistent exercise and plans to speak to his doctor about discontinuing the medications.  He also reports now that the weather has improved he feels better overall and is able to get outside more.  Counselor will continue to follow with Mr. Kleintop in the future. He belongs to [employer] and plays golf once a member $35/month. He is able to play gold once a month at least now. Marcelius said he is doing ok off the medicine that he talked to [place] about since he is exercising more and now he can work in his yard also.          Vocational Rehabilitation: Provide vocational rehab assistance to qualifying candidates.   Vocational Rehab Evaluation & Intervention:     Vocational Rehab - 04/22/15 1455    Initial Vocational Rehab Evaluation & Intervention   Assessment shows need for Vocational Rehabilitation No      Education: Education Goals: Education classes will be provided on a weekly basis, covering required topics. Participant will state understanding/return demonstration of topics presented.  Learning Barriers/Preferences:     Learning Barriers/Preferences - 04/22/15 1453    Learning Barriers/Preferences   Learning Barriers Hearing   Learning Preferences None      Education Topics: General Nutrition Guidelines/Fats and Fiber: -Group instruction provided by verbal, written material, models and posters to present the general guidelines for heart healthy nutrition. Gives an explanation and review of dietary fats and fiber.          Cardiac Rehab from 07/08/2015 in [facility] Cardiac and Pulmonary Rehab   Date  06/24/15   Educator  Colleen   Instruction Review Code  2- meets goals/outcomes      Controlling Sodium/Reading Food Labels: -Group verbal and written material supporting the discussion of sodium use in heart healthy nutrition. Review and explanation with models, verbal and written materials for  utilization of the food label.      Cardiac Rehab from 07/08/2015 in [facility] Cardiac and Pulmonary Rehab   Date  07/01/15   Educator  C. Joneen Caraway, RD   Instruction Review Code  R- Review/reinforce [Second Class]      Exercise Physiology & Risk Factors: - Group verbal and written instruction with models to review the exercise physiology of the cardiovascular system and associated critical values. Details cardiovascular disease risk factors and the goals associated with each risk factor.   Aerobic Exercise & Resistance Training: - Gives group verbal and written discussion on the health impact of inactivity. On the components of aerobic and resistive training programs and the benefits of this training and how to safely progress through these programs.      Cardiac Rehab from 07/08/2015 in [facility] Cardiac and Pulmonary Rehab   Date  07/08/15   Educator  [facility]   Instruction Review Code  R- Review/reinforce [Second Class]      Flexibility, Balance, General Exercise Guidelines: - Provides group verbal and written instruction on the benefits of flexibility and balance training programs. Provides general exercise guidelines with specific guidelines to those with heart or lung disease. Demonstration and skill practice provided.   Stress Management: - Provides group verbal and written instruction about the health risks of elevated stress, cause of high stress, and healthy ways to reduce stress.   Depression: - Provides group verbal and written instruction on the correlation between heart/lung disease and depressed mood, treatment options, and the stigmas associated with seeking treatment.   Anatomy & Physiology of the Heart: - Group verbal and written instruction and models provide basic cardiac anatomy and physiology, with the coronary electrical and arterial systems. Review of: AMI, Angina, Valve disease, Heart Failure, Cardiac Arrhythmia, Pacemakers, and the ICD.      Cardiac Rehab from 07/08/2015 in  [facility] Cardiac and Pulmonary Rehab   Date  05/20/15   Educator  SB   Instruction Review Code  2- meets goals/outcomes      Cardiac Procedures: - Group verbal and written instruction and models to describe the testing methods done to diagnose heart disease. Reviews the outcomes of the test results. Describes the treatment choices: Medical Management, Angioplasty, or Coronary Bypass Surgery.      Cardiac Rehab from 07/08/2015 in [facility] Cardiac and Pulmonary Rehab   Date  05/27/15   Educator  CE   Instruction Review Code  2- meets goals/outcomes      Cardiac Medications: - Group verbal and written instruction to review commonly prescribed medications for heart disease. Reviews the medication, class of the drug, and side effects. Includes the steps to properly store meds and maintain the prescription regimen.      Cardiac Rehab from 07/08/2015 in [facility] Cardiac and Pulmonary Rehab   Date  06/03/15   Educator  SB   Instruction Review Code  2- meets goals/outcomes      Go Sex-Intimacy & Heart Disease, Get SMART - Goal Setting: - Group verbal and written instruction through game format to discuss heart disease and the return to sexual intimacy. Provides group verbal and written material to discuss and apply goal setting through the application of the S.M.A.R.T. Method.  Cardiac Rehab from 07/08/2015 in [facility] Cardiac and Pulmonary Rehab   Date  05/27/15   Educator  CE   Instruction Review Code  2- meets goals/outcomes      Other Matters of the Heart: - Provides group verbal, written materials and models to describe Heart Failure, Angina, Valve Disease, and Diabetes in the realm of heart disease. Includes description of the disease process and treatment options available to the cardiac patient.      Cardiac Rehab from 07/08/2015 in [facility] Cardiac and Pulmonary Rehab   Date  05/20/15   Educator  SB   Instruction Review Code  2- meets goals/outcomes      Exercise & Equipment Safety: -  Individual verbal instruction and demonstration of equipment use and safety with use of the equipment.      Cardiac Rehab from 07/08/2015 in [facility] Cardiac and Pulmonary Rehab   Date  04/26/15   Educator  C. EnterkinRN   Instruction Review Code  1- partially meets, needs review/practice [Mance almost fell on the treadmill so we got him on the [place]]      Infection Prevention: - Provides verbal and written material to individual with discussion of infection control including proper hand washing and proper equipment cleaning during exercise session.      Cardiac Rehab from 04/22/2015 in [facility] Cardiac and Pulmonary Rehab   Date  04/22/15   Educator  C. EnterkinRN   Instruction Review Code  2- meets goals/outcomes      Falls Prevention: - Provides verbal and written material to individual with discussion of falls prevention and safety.      Cardiac Rehab from 04/22/2015 in [facility] Cardiac and Pulmonary Rehab   Date  04/22/15   Educator  C. [place]   Instruction Review Code  2- meets goals/outcomes      Diabetes: - Individual verbal and written instruction to review signs/symptoms of diabetes, desired ranges of glucose level fasting, after meals and with exercise. Advice that pre and post exercise glucose checks will be done for 3 sessions at entry of program.    Knowledge Questionnaire Score:   Core Components/Risk Factors/Patient Goals at Admission:     Personal Goals and Risk Factors at Admission - 04/22/15 1502    Core Components/Risk Factors/Patient Goals on Admission   Sedentary Yes   Intervention Provide advice, education, support and counseling about physical activity/exercise needs.;Develop an individualized exercise prescription for aerobic and resistive training based on initial evaluation findings, risk stratification, comorbidities and participant's personal goals.   Expected Outcomes Achievement of increased cardiorespiratory fitness and enhanced flexibility, muscular  endurance and strength shown through measurements of functional capacity and personal statement of participant.   Increase Strength and Stamina Yes   Intervention Provide advice, education, support and counseling about physical activity/exercise needs.;Develop an individualized exercise prescription for aerobic and resistive training based on initial evaluation findings, risk stratification, comorbidities and participant's personal goals.  "I really want to get back to be able to get back on the golf course.   Expected Outcomes Achievement of increased cardiorespiratory fitness and enhanced flexibility, muscular endurance and strength shown through measurements of functional capacity and personal statement of participant.   Tobacco Cessation Yes   Number of packs per day 0.25   Intervention Assist the participant in steps to quit. Provide individualized education and counseling about committing to Tobacco Cessation, relapse prevention, and pharmacological support that can be provided by physician.;Advice worker, assist with locating and accessing local/national Quit Smoking programs, and support  quit date choice.   Expected Outcomes Short Term: Will quit all tobacco product use, adhering to prevention of relapse plan.;Long Term: Complete abstinence from all tobacco products for at least 12 months from quit date.   Intervention Provide education, individualized exercise plan and daily activity instruction to help decrease symptoms of SOB with activities of daily living.   Expected Outcomes Short Term: Achieves a reduction of symptoms when performing activities of daily living.   Develop more efficient breathing techniques such as purse lipped breathing and diaphragmatic breathing; and practicing self-pacing with activity Yes      Core Components/Risk Factors/Patient Goals Review:      Goals and Risk Factor Review      04/26/15 0927 04/29/15 0949 05/03/15 0852 05/27/15 1213 06/28/15  0811   Core Components/Risk Factors/Patient Goals Review   Personal Goals Review Sedentary Sedentary Tobacco Cessation Sedentary Sedentary   Review Marquet said he wanted to do more today but we instructed him first day to keep Rate of Perceived exertion at 11-12.  Aodhan said he has been able to work out in his yard some which was his goal. Moss had 6 cigarettes in the past 24 hours. Gerold reports he really doesn't see the need to quit smoking since what he heard from his doctor that his heart problem didn't have  any thing to do with his lifestyle. Sargeant states that he is not ready to quit smoking.  He had valve disease and no cardiac risk factors except the tobacco use.  He stated that when he wants to quit, he will be able to readdily quit.  Traylen stated that he is feeling stronger and able to do more daily activities. He enjoys golf and is finally going to attempt to play tomorrow (05/28/15) with a group of seniors. He has not played since August 2016.  Playing Golf has gone well and he will play next Tuesday also. He was able to work out in his yard alot yesterday dividing his iris. He said he has 1/2 of this exercise room size of iris plants which he started with 6 plants.    Expected Outcomes Goal to increase his exercise ability.    Short term: Liron decides to quit smoking and is able to quit and maintain.cessation Chikezie will be able to tolerate golfing again since he is increasing his strengh and stamina from consistant exercise.       06/28/15 0818           Core Components/Risk Factors/Patient Goals Review   Review Bernett reported today when asked if he wanted to meet individually with the REgistered Dietician "what for. My primary care MD told me to keep doing what I am doing. My cholesterol has never been above 200 and my good cholestrol is 40 and exercise will help with that and the LDL has always been low.  Maritza is playing golf and worked on dividing his iris in his yard yesterday.        Expected  Outcomes Cont to play golf and work in his yard and get aerobic exercise.           Core Components/Risk Factors/Patient Goals at Discharge (Final Review):      Goals and Risk Factor Review - 06/28/15 0818    Core Components/Risk Factors/Patient Goals Review   Review Sequan reported today when asked if he wanted to meet individually with the REgistered Dietician "what for. My primary care MD told me to keep doing what I am doing.  My cholesterol has never been above 200 and my good cholestrol is 40 and exercise will help with that and the LDL has always been low.  Jerret is playing golf and worked on dividing his iris in his yard yesterday.    Expected Outcomes Cont to play golf and work in his yard and get aerobic exercise.       ITP Comments:     ITP Comments      04/26/15 0926 05/12/15 0938 06/09/15 1206 06/28/15 0819 06/28/15 0845   ITP Comments Gagandeep's first day and he admitted that he has never been on a treadmill before. He almost fell so we changed him to Nustep for now. After class I got on a treadmill and explained it to him.  30 day review.  Continue with ITP  has attended 6 sessions , new to program 30 day review. Continue with ITP Giavonni is playing golf and worked on dividing his iris in his yard yesterday Demetrias has [place] and will consider the use of the [place] since it is free to him wiht [employer].      07/10/15 0715 07/31/15 1619 08/07/15 0716       ITP Comments 30 day review.  Continue with ITP Pt has been out since last review. 30 day review. Continue with ITP        Comments:

## 2015-08-13 ENCOUNTER — Encounter: Payer: Self-pay | Admitting: *Deleted

## 2015-08-13 DIAGNOSIS — Z952 Presence of prosthetic heart valve: Secondary | ICD-10-CM

## 2015-08-14 NOTE — Progress Notes (Signed)
Cardiac Individual Treatment Plan  Patient Details  Name: Daniel YETTER Sr. MRN: [id number] Date of Birth: 08/21/1940 Referring Provider:   Flowsheet Row Cardiac Rehab from 07/01/2015 in [facility] Cardiac and Pulmonary Rehab  Referring Provider  Sherren Mocha MD      Initial Encounter Date:  Flowsheet Row Cardiac Rehab from 07/01/2015 in [facility] Cardiac and Pulmonary Rehab  Referring Provider  Sherren Mocha MD      Visit Diagnosis: S/P AVR (aortic valve replacement)  Patient's Home Medications on Admission:  Current Outpatient Prescriptions:  .  aspirin EC 81 MG EC tablet, Take 1 tablet (81 mg total) by mouth daily., Disp: , Rfl:  .  citalopram (CELEXA) 10 MG tablet, Take by mouth., Disp: , Rfl:  .  ferrous sulfate 325 (65 FE) MG tablet, Take 1 tablet by mouth daily., Disp: , Rfl:  .  finasteride (PROSCAR) 5 MG tablet, Take by mouth., Disp: , Rfl:  .  levocetirizine (XYZAL) 5 MG tablet, Take 5 mg by mouth daily., Disp: , Rfl:  .  montelukast (SINGULAIR) 10 MG tablet, Take 10 mg by mouth at bedtime., Disp: , Rfl:  .  Multiple Vitamin (MULTI-VITAMINS) TABS, Take 1 tablet by mouth daily., Disp: , Rfl:  .  omeprazole (PRILOSEC) 20 MG capsule, Take 20 mg by mouth daily., Disp: , Rfl:  .  sildenafil (REVATIO) 20 MG tablet, , Disp: , Rfl:  .  tamsulosin (FLOMAX) 0.4 MG CAPS capsule, Take 0.4 mg by mouth daily., Disp: , Rfl:  .  vitamin C (ASCORBIC ACID) 500 MG tablet, Take 500 mg by mouth daily., Disp: , Rfl:  .  vitamin E (VITAMIN E) 400 UNIT capsule, Take 400 Units by mouth daily., Disp: , Rfl:   Past Medical History: Past Medical History:  Diagnosis Date  . Aortic stenosis    EF 50% echo 2010  . Asthma   . BPH (benign prostatic hyperplasia)   . C. difficile colitis   . Depression   . GERD (gastroesophageal reflux disease)   . Heart murmur   . Shortness of breath dyspnea    WITH EXERTION     Tobacco Use: History  Smoking Status  . Current Every Day Smoker  . Packs/day: 0.33   . Years: 58.00  . Types: Cigarettes  Smokeless Tobacco  . Not on file    Labs: Recent Review Flowsheet Data    Labs for ITP Cardiac and Pulmonary Rehab Latest Ref Rng & Units 11/08/2014 11/08/2014 11/08/2014 11/08/2014 11/08/2014   Hemoglobin A1c 4.8 - 5.6 % - - - - -   PHART 7.350 - 7.450 - 7.301(L) 7.317(L) 7.315(L) -   PCO2ART 35.0 - 45.0 mmHg - 54.1(H) 40.7 39.9 -   HCO3 20.0 - 24.0 mEq/L - 26.9(H) 21.0 20.5 -   TCO2 0 - 100 mmol/L 26 29 22 22 19    ACIDBASEDEF 0.0 - 2.0 mmol/L - 1.0 5.0(H) 6.0(H) -   O2SAT % - 99.0 97.0 98.0 -       Exercise Target Goals:    Exercise Program Goal: Individual exercise prescription set with THRR, safety & activity barriers. Participant demonstrates ability to understand and report RPE using BORG scale, to self-measure pulse accurately, and to acknowledge the importance of the exercise prescription.  Exercise Prescription Goal: Starting with aerobic activity 30 plus minutes a day, 3 days per week for initial exercise prescription. Provide home exercise prescription and guidelines that participant acknowledges understanding prior to discharge.  Activity Barriers & Risk Stratification:  Activity Barriers & Cardiac Risk Stratification - 04/22/15 1452      Activity Barriers & Cardiac Risk Stratification   Activity Barriers Shortness of Breath   Cardiac Risk Stratification High      6 Minute Walk:     6 Minute Walk    Row Name 04/22/15 1418         6 Minute Walk   Phase Initial     Distance 1345 feet     Walk Time 6 minutes     # of Rest Breaks 0     RPE 9     Symptoms No     Resting HR 84 bpm     Resting BP 128/80     Max Ex. HR 108 bpm     Max Ex. BP 130/60        Initial Exercise Prescription:     Initial Exercise Prescription - 07/03/15 1000      Date of Initial Exercise RX and Referring Provider   Referring Provider Sherren Mocha MD      Perform Capillary Blood Glucose checks as needed.  Exercise  Prescription Changes:     Exercise Prescription Changes    Row Name 04/29/15 0647 05/29/15 0700 06/19/15 1300 06/28/15 0800 07/03/15 1000     Exercise Review   Progression Yes Yes Yes  - Yes     Response to Exercise   Blood Pressure (Admit) 128/54 120/60 106/72  - 130/82   Blood Pressure (Exercise) 142/74 168/88 132/70  - 132/74   Blood Pressure (Exit) 110/62 134/60 94/62  - 114/62   Heart Rate (Admit) 99 bpm 73 bpm 88 bpm  - 76 bpm   Heart Rate (Exercise) 108 bpm 134 bpm 126 bpm  - 139 bpm   Heart Rate (Exit) 93 bpm 116 bpm 96 bpm  - 76 bpm   Rating of Perceived Exertion (Exercise) 12 14 14   - 13   Symptoms none none none  - none   Comments Patient is just beginning program but has tolerated exercise well with no signs or symptoms during his first few exercise sessions.   -  -  -  -   Duration Progress to 30 minutes of continuous aerobic without signs/symptoms of physical distress Progress to 30 minutes of continuous aerobic without signs/symptoms of physical distress Progress to 30 minutes of continuous aerobic without signs/symptoms of physical distress  - Progress to 45 minutes of aerobic exercise without signs/symptoms of physical distress   Intensity Rest + 30 Rest + 40 THRR New  111-134  - THRR unchanged     Progression   Progression Continue progressive overload as per policy without signs/symptoms or physical distress. Continue progressive overload as per policy without signs/symptoms or physical distress. Continue to progress workloads to maintain intensity without signs/symptoms of physical distress.  - Continue to progress workloads to maintain intensity without signs/symptoms of physical distress.   Average METs  -  - 4.5  - 4.5     Resistance Training   Training Prescription Yes Yes Yes  - Yes   Weight 3 4 4   - 4   Reps 10-12 10-12 10-15  - 10-15     Interval Training   Interval Training  - Yes Yes  - Yes   Equipment  - T5 Nustep T5 Nustep  - Treadmill;Arm Ergometer;T5  Nustep   Comments  - Intervals 2 min by 30 second. RPE should range between 13-15, watts on T5 range 65-75.  Intervals 2  min by 30 second. RPE should range between 13-15, watts on T5 range 65-75.   - Intervals 2 min by 30 second. RPE should range between 13-15, watts on T5 range 65-75.      Treadmill   MPH 2.5 3 3.2  - 3.2   Grade 0 5 5  - 9   Minutes 12 15 15   - 15   METs  -  - 5.44  - 7.4     Arm Ergometer   Level  -  - 5  - 5.4   Watts  -  - 33  - 54   Minutes  -  - 15  - 15     T5 Nustep   Level 5 8 8   - 7   Watts 45 75  Intervals: watts from 65-75 108  - 117   Minutes 15 20 15   - 10   METs  -  - 5.6  - 3.8     Biostep-RELP   Level 3 -  -  -  -   Watts 40 -  -  -  -   Minutes 15 -  -  -  -     Home Exercise Plan   Plans to continue exercise at  -  -  - -  Daniel Kennedy has [employer] and will consider the use of the Ind  -      Exercise Comments:     Exercise Comments    Row Name 04/29/15 1201 04/29/15 1202 05/27/15 1223 05/29/15 0706 05/31/15 1013   Exercise Comments Daniel Kennedy has  [place] has just begun the program and is tolerating exercise well with no symptoms. He had some difficulty on his first day walking on the TM only due to his gate and never being on a TM before. He was able to get more comfortable on the machine during his next exercise session and was able to walk  the  intensity and duration of his exercise prescription.  Daniel Kennedy stated that he is feeling stronger and able to do more daily activities. He enjoys golf and is finally going to attempt to play tomorrow (05/28/15) with a group of seniors. He has not played since August 2016.  Reviewed individualized exercise prescription and made increases per departmental policy. Exercise increases were discussed with the patient and they were able to perform the new work loads without issue (no signs or symptoms).  Interval training reommendations and benefits discussed with patient.  Daniel Kennedy was able to go golfing this week for the  first time in a while. He tolerated it well physically and enjoyed his time.    Daniel Kennedy Name 06/19/15 1319 06/28/15 0844 07/03/15 1101 07/18/15 0847 07/31/15 1619   Exercise Comments Daniel Kennedy has continued to do well with exercise progression.  He has been out of town for the holiday weekend. Will continue to monitor progress. Daniel Kennedy has [employer] and will consider the use of the [employer] since it is free to him wiht [employer].  Daniel Kennedy has been working hard on his intervals.  He will be doing his post 6 min walk test.  We wil continue to talk to him about joining the [employer] using his [place]. Daniel Kennedy last visit was 07/08/15 Pt has been out since last review.   Daniel Kennedy Name 08/13/15 1403           Exercise Comments Pt has been out since last review.  Discharge Exercise Prescription (Final Exercise Prescription Changes):     Exercise Prescription Changes - 07/03/15 1000      Exercise Review   Progression Yes     Response to Exercise   Blood Pressure (Admit) 130/82   Blood Pressure (Exercise) 132/74   Blood Pressure (Exit) 114/62   Heart Rate (Admit) 76 bpm   Heart Rate (Exercise) 139 bpm   Heart Rate (Exit) 76 bpm   Rating of Perceived Exertion (Exercise) 13   Symptoms none   Duration Progress to 45 minutes of aerobic exercise without signs/symptoms of physical distress   Intensity THRR unchanged     Progression   Progression Continue to progress workloads to maintain intensity without signs/symptoms of physical distress.   Average METs 4.5     Resistance Training   Training Prescription Yes   Weight 4   Reps 10-15     Interval Training   Interval Training Yes   Equipment Treadmill;Arm Ergometer;T5 Nustep   Comments Intervals 2 min by 30 second. RPE should range between 13-15, watts on T5 range 65-75.      Treadmill   MPH 3.2   Grade 9   Minutes 15   METs 7.4     Arm Ergometer   Level 5.4   Watts 54   Minutes 15     T5 Nustep   Level 7    Watts 117   Minutes 10   METs 3.8      Nutrition:  Target Goals: Understanding of nutrition guidelines, daily intake of sodium 1500mg , cholesterol 200mg , calories 30% from fat and 7% or less from saturated fats, daily to have 5 or more servings of fruits and vegetables.  Biometrics:     Pre Biometrics - 04/22/15 1424      Pre Biometrics   Height 6' 0.9" (1.852 m)   Weight 158 lb 3.2 oz (71.8 kg)   Waist Circumference 34.75 inches   Hip Circumference 40 inches   Waist to Hip Ratio 0.87 %   BMI (Calculated) 21       Nutrition Therapy Plan and Nutrition Goals:     Nutrition Therapy & Goals - 04/22/15 1501      Personal Nutrition Goals   Personal Goal #1 To cont to eat healthy "I feel like I eat healthy so I don't see the need to meet individually with the [facility] Registered Dietician.      Intervention Plan   Intervention Prescribe, educate and counsel regarding individualized specific dietary modifications aiming towards targeted core components such as weight, hypertension, lipid management, diabetes, heart failure and other comorbidities.   Expected Outcomes Short Term Goal: Understand basic principles of dietary content, such as calories, fat, sodium, cholesterol and nutrients.;Long Term Goal: Adherence to prescribed nutrition plan.      Nutrition Discharge: Rate Your Plate Scores:   Nutrition Goals Re-Evaluation:     Nutrition Goals Re-Evaluation    Row Name 04/29/15 [id number] 06/28/15 [id number]           Personal Goal #1 Re-Evaluation   Goal Progress Seen Yes  -      Comments Daniel Kennedy drinks 16 oz a day plus one fruit at least.  Daniel Kennedy reported today when asked if he wanted to meet individually with the REgistered Dietician "what for. My primary care MD told me to keep doing what I am doing. My cholesterol has never been above 200 and my good cholestrol is 40 and exercise will help with that and the LDL has  always been low.         Psychosocial: Target Goals: Acknowledge  presence or absence of depression, maximize coping skills, provide positive support system. Participant is able to verbalize types and ability to use techniques and skills needed for reducing stress and depression.  Initial Review & Psychosocial Screening:     Initial Psych Review & Screening - 04/22/15 1504      Family Dynamics   Good Support System? Yes     Barriers   Psychosocial barriers to participate in program There are no identifiable barriers or psychosocial needs.     Screening Interventions   Interventions Encouraged to exercise      Quality of Life Scores:   PHQ-9: Recent Review Flowsheet Data    Depression screen [facility] 2/9 04/22/2015   Decreased Interest 1   Down, Depressed, Hopeless 0   PHQ - 2 Score 1   Altered sleeping 1   Tired, decreased energy 2   Change in appetite 0   Feeling bad or failure about yourself  0   Trouble concentrating 0   Moving slowly or fidgety/restless 0   Suicidal thoughts 0   PHQ-9 Score 4   Difficult doing work/chores Somewhat difficult      Psychosocial Evaluation and Intervention:     Psychosocial Evaluation - 07/08/15 0912      Discharge Psychosocial Assessment & Intervention   Comments Follow with Daniel Kennedy today stating he just "got a bill from this program" and may not be able to continue exercising here as he was unaware his insurance may not cover all of the sessions and he can't afford to keep coming.  He reports this program has accomplished the purpose for him to increase his stamina and strength and get his energy levels back up.  He states his mood is good and he plans to continue exercising by walking and playing golf.  Counselor commended Daniel Kennedy for his hard work and commitment to exercise.  He is going to try to see if the insurance issues can be worked out and he can continue this program as he states he has enjoyed it and its benefits.        Psychosocial Re-Evaluation:     Psychosocial Re-Evaluation     Row Name 05/20/15 [phone] 06/28/15 0813           Psychosocial Re-Evaluation   Comments Counselor follow up with Daniel Kennedy reporting feeling increased energy since coming into this program.  He states his mood has improved as well and when asked about the medications, he reports he is taking daily, but still "can't tell any difference."  Daniel Kennedy attributes the improvement in mood to the consistent exercise and plans to speak to his doctor about discontinuing the medications.  He also reports now that the weather has improved he feels better overall and is able to get outside more.  Counselor will continue to follow with Daniel Kennedy in the future. He belongs to [employer] and plays golf once a member $35/month. He is able to play gold once a month at least now. Daniel Kennedy said he is doing ok off the medicine that he talked to Daniel Kennedy about since he is exercising more and now he can work in his yard also.          Vocational Rehabilitation: Provide vocational rehab assistance to qualifying candidates.   Vocational Rehab Evaluation & Intervention:     Vocational Rehab - 04/22/15 1455  Initial Vocational Rehab Evaluation & Intervention   Assessment shows need for Vocational Rehabilitation No      Education: Education Goals: Education classes will be provided on a weekly basis, covering required topics. Participant will state understanding/return demonstration of topics presented.  Learning Barriers/Preferences:     Learning Barriers/Preferences - 04/22/15 1453      Learning Barriers/Preferences   Learning Barriers Hearing   Learning Preferences None      Education Topics: General Nutrition Guidelines/Fats and Fiber: -Group instruction provided by verbal, written material, models and posters to present the general guidelines for heart healthy nutrition. Gives an explanation and review of dietary fats and fiber. Flowsheet Row Cardiac Rehab from 07/08/2015 in [facility] Cardiac  and Pulmonary Rehab  Date  06/24/15  Educator  Jaclyn Shaggy  Instruction Review Code  2- meets goals/outcomes      Controlling Sodium/Reading Food Labels: -Group verbal and written material supporting the discussion of sodium use in heart healthy nutrition. Review and explanation with models, verbal and written materials for utilization of the food label. Flowsheet Row Cardiac Rehab from 07/08/2015 in [facility] Cardiac and Pulmonary Rehab  Date  07/01/15  Educator  C. Joneen Caraway, RD  Instruction Review Code  R- Review/reinforce [Second Class]      Exercise Physiology & Risk Factors: - Group verbal and written instruction with models to review the exercise physiology of the cardiovascular system and associated critical values. Details cardiovascular disease risk factors and the goals associated with each risk factor.   Aerobic Exercise & Resistance Training: - Gives group verbal and written discussion on the health impact of inactivity. On the components of aerobic and resistive training programs and the benefits of this training and how to safely progress through these programs. Flowsheet Row Cardiac Rehab from 07/08/2015 in [facility] Cardiac and Pulmonary Rehab  Date  07/08/15  Educator  [facility]  Instruction Review Code  R- Review/reinforce [Second Class]      Flexibility, Balance, General Exercise Guidelines: - Provides group verbal and written instruction on the benefits of flexibility and balance training programs. Provides general exercise guidelines with specific guidelines to those with heart or lung disease. Demonstration and skill practice provided.   Stress Management: - Provides group verbal and written instruction about the health risks of elevated stress, cause of high stress, and healthy ways to reduce stress.   Depression: - Provides group verbal and written instruction on the correlation between heart/lung disease and depressed mood, treatment options, and the stigmas associated with  seeking treatment.   Anatomy & Physiology of the Heart: - Group verbal and written instruction and models provide basic cardiac anatomy and physiology, with the coronary electrical and arterial systems. Review of: AMI, Angina, Valve disease, Heart Failure, Cardiac Arrhythmia, Pacemakers, and the ICD. Flowsheet Row Cardiac Rehab from 07/08/2015 in [facility] Cardiac and Pulmonary Rehab  Date  05/20/15  Educator  SB  Instruction Review Code  2- meets goals/outcomes      Cardiac Procedures: - Group verbal and written instruction and models to describe the testing methods done to diagnose heart disease. Reviews the outcomes of the test results. Describes the treatment choices: Medical Management, Angioplasty, or Coronary Bypass Surgery. Flowsheet Row Cardiac Rehab from 07/08/2015 in [facility] Cardiac and Pulmonary Rehab  Date  05/27/15  Educator  CE  Instruction Review Code  2- meets goals/outcomes      Cardiac Medications: - Group verbal and written instruction to review commonly prescribed medications for heart disease. Reviews the medication, class of the drug,  and side effects. Includes the steps to properly store meds and maintain the prescription regimen. Flowsheet Row Cardiac Rehab from 07/08/2015 in [facility] Cardiac and Pulmonary Rehab  Date  06/03/15  Educator  SB  Instruction Review Code  2- meets goals/outcomes      Go Sex-Intimacy & Heart Disease, Get SMART - Goal Setting: - Group verbal and written instruction through game format to discuss heart disease and the return to sexual intimacy. Provides group verbal and written material to discuss and apply goal setting through the application of the S.M.A.R.T. Method. Flowsheet Row Cardiac Rehab from 07/08/2015 in [facility] Cardiac and Pulmonary Rehab  Date  05/27/15  Educator  CE  Instruction Review Code  2- meets goals/outcomes      Other Matters of the Heart: - Provides group verbal, written materials and models to describe Heart Failure,  Angina, Valve Disease, and Diabetes in the realm of heart disease. Includes description of the disease process and treatment options available to the cardiac patient. Flowsheet Row Cardiac Rehab from 07/08/2015 in [facility] Cardiac and Pulmonary Rehab  Date  05/20/15  Educator  SB  Instruction Review Code  2- meets goals/outcomes      Exercise & Equipment Safety: - Individual verbal instruction and demonstration of equipment use and safety with use of the equipment. Flowsheet Row Cardiac Rehab from 07/08/2015 in [facility] Cardiac and Pulmonary Rehab  Date  04/26/15  Educator  C. EnterkinRN  Instruction Review Code  1- partially meets, needs review/practice [Anthonyjames almost fell on the treadmill so we got him on the [place]]      Infection Prevention: - Provides verbal and written material to individual with discussion of infection control including proper hand washing and proper equipment cleaning during exercise session. Flowsheet Row Cardiac Rehab from 04/22/2015 in [facility] Cardiac and Pulmonary Rehab  Date  04/22/15  Educator  C. EnterkinRN  Instruction Review Code  2- meets goals/outcomes      Falls Prevention: - Provides verbal and written material to individual with discussion of falls prevention and safety. Flowsheet Row Cardiac Rehab from 04/22/2015 in [facility] Cardiac and Pulmonary Rehab  Date  04/22/15  Educator  C. [place]ix  Instruction Review Code  2- meets goals/outcomes      Diabetes: - Individual verbal and written instruction to review signs/symptoms of diabetes, desired ranges of glucose level fasting, after meals and with exercise. Advice that pre and post exercise glucose checks will be done for 3 sessions at entry of program.    Knowledge Questionnaire Score:   Core Components/Risk Factors/Patient Goals at Admission:     Personal Goals and Risk Factors at Admission - 04/22/15 1502      Core Components/Risk Factors/Patient Goals on Admission   Sedentary Yes   Intervention  Provide advice, education, support and counseling about physical activity/exercise needs.;Develop an individualized exercise prescription for aerobic and resistive training based on initial evaluation findings, risk stratification, comorbidities and participant's personal goals.   Expected Outcomes Achievement of increased cardiorespiratory fitness and enhanced flexibility, muscular endurance and strength shown through measurements of functional capacity and personal statement of participant.   Increase Strength and Stamina Yes   Intervention Provide advice, education, support and counseling about physical activity/exercise needs.;Develop an individualized exercise prescription for aerobic and resistive training based on initial evaluation findings, risk stratification, comorbidities and participant's personal goals.  "I really want to get back to be able to get back on the golf course.   Expected Outcomes Achievement of increased cardiorespiratory fitness and enhanced  flexibility, muscular endurance and strength shown through measurements of functional capacity and personal statement of participant.   Tobacco Cessation Yes   Number of packs per day 0.25   Intervention Assist the participant in steps to quit. Provide individualized education and counseling about committing to Tobacco Cessation, relapse prevention, and pharmacological support that can be provided by physician.;Advice worker, assist with locating and accessing local/national Quit Smoking programs, and support quit date choice.   Expected Outcomes Short Term: Will quit all tobacco product use, adhering to prevention of relapse plan.;Long Term: Complete abstinence from all tobacco products for at least 12 months from quit date.   Intervention Provide education, individualized exercise plan and daily activity instruction to help decrease symptoms of SOB with activities of daily living.   Expected Outcomes Short Term: Achieves a  reduction of symptoms when performing activities of daily living.   Develop more efficient breathing techniques such as purse lipped breathing and diaphragmatic breathing; and practicing self-pacing with activity Yes      Core Components/Risk Factors/Patient Goals Review:      Goals and Risk Factor Review    Row Name 04/26/15 0927 04/29/15 0949 05/03/15 0852 05/27/15 1213 06/28/15 0811     Core Components/Risk Factors/Patient Goals Review   Personal Goals Review Sedentary Sedentary Tobacco Cessation Sedentary Sedentary   Review Lashan said he wanted to do more today but we instructed him first day to keep Rate of Perceived exertion at 11-12.  Arnet said he has been able to work out in his yard some which was his goal. Jakoda had 6 cigarettes in the past 24 hours. Kais reports he really doesn't see the need to quit smoking since what he heard from his doctor that his heart problem didn't have  any thing to do with his lifestyle. Saylor states that he is not ready to quit smoking.  He had valve disease and no cardiac risk factors except the tobacco use.  He stated that when he wants to quit, he will be able to readdily quit.  Davy stated that he is feeling stronger and able to do more daily activities. He enjoys golf and is finally going to attempt to play tomorrow (05/28/15) with a group of seniors. He has not played since August 2016.  Playing Golf has gone well and he will play next Tuesday also. He was able to work out in his yard alot yesterday dividing his iris. He said he has 1/2 of this exercise room size of iris plants which he started with 6 plants.    Expected Outcomes Goal to increase his exercise ability.   -  Short term: Sevan decides to quit smoking and is able to quit and maintain.cessation Arsalan will be able to tolerate golfing again since he is increasing his strengh and stamina from consistant exercise.   -   Bernie Name 06/28/15 0818             Core Components/Risk Factors/Patient Goals  Review   Review Cobin reported today when asked if he wanted to meet individually with the REgistered Dietician "what for. My primary care MD told me to keep doing what I am doing. My cholesterol has never been above 200 and my good cholestrol is 40 and exercise will help with that and the LDL has always been low.  Ziyan is playing golf and worked on dividing his iris in his yard yesterday.        Expected Outcomes Cont to play golf and work  in his yard and get aerobic exercise.           Core Components/Risk Factors/Patient Goals at Discharge (Final Review):      Goals and Risk Factor Review - 06/28/15 0818      Core Components/Risk Factors/Patient Goals Review   Review Waine reported today when asked if he wanted to meet individually with the REgistered Dietician "what for. My primary care MD told me to keep doing what I am doing. My cholesterol has never been above 200 and my good cholestrol is 40 and exercise will help with that and the LDL has always been low.  Lindbergh is playing golf and worked on dividing his iris in his yard yesterday.    Expected Outcomes Cont to play golf and work in his yard and get aerobic exercise.       ITP Comments:     ITP Comments    Row Name 04/26/15 [phone] 05/12/15 0938 06/09/15 1206 06/28/15 0819 06/28/15 0845   ITP Comments Yahshua's first day and he admitted that he has never been on a treadmill before. He almost fell so we changed him to Nustep for now. After class I got on a treadmill and explained it to him.  30 day review.  Continue with ITP  has attended 6 sessions , new to program 30 day review. Continue with ITP Ziv is playing golf and worked on dividing his iris in his yard yesterday Torez has [employer], jewellery and will consider the use of the [employer] since it is free to him wiht [employer].    Sebring Name 07/10/15 0715 07/31/15 1619 08/07/15 0716 08/13/15 1403 08/14/15 1450   ITP Comments 30 day review.  Continue with ITP Pt has been out since last  review. 30 day review. Continue with ITP Pt has been out since last review. Pt called to discharge.      Comments: Discharge ITP

## 2015-08-14 NOTE — Progress Notes (Signed)
Discharge Summary  Patient Details  Name: Daniel Kennedy Sr. MRN: [id number] Date of Birth: 09-08-1940 Referring Provider:   Flowsheet Row Cardiac Rehab from 07/01/2015 in [facility]  Referring Provider  Sherren Mocha MD       Number of Visits: 30  Reason for Discharge:  Patient reached a stable level of exercise. Patient independent in their exercise.  Smoking History:  History  Smoking Status  . Current Every Day Smoker  . Packs/day: 0.33  . Years: 58.00  . Types: Cigarettes  Smokeless Tobacco  . Not on file    Diagnosis:  S/P AVR (aortic valve replacement)  ADL UCSD:   Initial Exercise Prescription:     Initial Exercise Prescription - 07/03/15 1000      Date of Initial Exercise RX and Referring Provider   Referring Provider Sherren Mocha MD      Discharge Exercise Prescription (Final Exercise Prescription Changes):     Exercise Prescription Changes - 07/03/15 1000      Exercise Review   Progression Yes     Response to Exercise   Blood Pressure (Admit) 130/82   Blood Pressure (Exercise) 132/74   Blood Pressure (Exit) 114/62   Heart Rate (Admit) 76 bpm   Heart Rate (Exercise) 139 bpm   Heart Rate (Exit) 76 bpm   Rating of Perceived Exertion (Exercise) 13   Symptoms none   Duration Progress to 45 minutes of aerobic exercise without signs/symptoms of physical distress   Intensity THRR unchanged     Progression   Progression Continue to progress workloads to maintain intensity without signs/symptoms of physical distress.   Average METs 4.5     Resistance Training   Training Prescription Yes   Weight 4   Reps 10-15     Interval Training   Interval Training Yes   Equipment Treadmill;Arm Ergometer;T5 Nustep   Comments Intervals 2 min by 30 second. RPE should range between 13-15, watts on T5 range 65-75.      Treadmill   MPH 3.2   Grade 9   Minutes 15   METs 7.4     Arm Ergometer   Level 5.4   Watts 54   Minutes  15     T5 Nustep   Level 7   Watts 117   Minutes 10   METs 3.8      Functional Capacity:     6 Minute Walk    Row Name 04/22/15 1418         6 Minute Walk   Phase Initial     Distance 1345 feet     Walk Time 6 minutes     # of Rest Breaks 0     RPE 9     Symptoms No     Resting HR 84 bpm     Resting BP 128/80     Max Ex. HR 108 bpm     Max Ex. BP 130/60        Psychological, QOL, Others - Outcomes: PHQ 2/9: Depression screen PHQ 2/9 04/22/2015  Decreased Interest 1  Down, Depressed, Hopeless 0  PHQ - 2 Score 1  Altered sleeping 1  Tired, decreased energy 2  Change in appetite 0  Feeling bad or failure about yourself  0  Trouble concentrating 0  Moving slowly or fidgety/restless 0  Suicidal thoughts 0  PHQ-9 Score 4  Difficult doing work/chores Somewhat difficult    Quality of Life:   Personal Goals: Goals established  at orientation with interventions provided to work toward goal.     Personal Goals and Risk Factors at Admission - 04/22/15 1502      Core Components/Risk Factors/Patient Goals on Admission   Sedentary Yes   Intervention Provide advice, education, support and counseling about physical activity/exercise needs.;Develop an individualized exercise prescription for aerobic and resistive training based on initial evaluation findings, risk stratification, comorbidities and participant's personal goals.   Expected Outcomes Achievement of increased cardiorespiratory fitness and enhanced flexibility, muscular endurance and strength shown through measurements of functional capacity and personal statement of participant.   Increase Strength and Stamina Yes   Intervention Provide advice, education, support and counseling about physical activity/exercise needs.;Develop an individualized exercise prescription for aerobic and resistive training based on initial evaluation findings, risk stratification, comorbidities and participant's personal goals.  "I  really want to get back to be able to get back on the golf course.   Expected Outcomes Achievement of increased cardiorespiratory fitness and enhanced flexibility, muscular endurance and strength shown through measurements of functional capacity and personal statement of participant.   Tobacco Cessation Yes   Number of packs per day 0.25   Intervention Assist the participant in steps to quit. Provide individualized education and counseling about committing to Tobacco Cessation, relapse prevention, and pharmacological support that can be provided by physician.;Advice worker, assist with locating and accessing local/national Quit Smoking programs, and support quit date choice.   Expected Outcomes Short Term: Will quit all tobacco product use, adhering to prevention of relapse plan.;Long Term: Complete abstinence from all tobacco products for at least 12 months from quit date.   Intervention Provide education, individualized exercise plan and daily activity instruction to help decrease symptoms of SOB with activities of daily living.   Expected Outcomes Short Term: Achieves a reduction of symptoms when performing activities of daily living.   Develop more efficient breathing techniques such as purse lipped breathing and diaphragmatic breathing; and practicing self-pacing with activity Yes       Personal Goals Discharge:     Goals and Risk Factor Review    Row Name 04/26/15 0927 04/29/15 0949 05/03/15 0852 05/27/15 1213 06/28/15 0811     Core Components/Risk Factors/Patient Goals Review   Personal Goals Review Sedentary Sedentary Tobacco Cessation Sedentary Sedentary   Review Sayvon said he wanted to do more today but we instructed him first day to keep Rate of Perceived exertion at 11-12.  Ruairi said he has been able to work out in his yard some which was his goal. Rafeal had 6 cigarettes in the past 24 hours. Tilghman reports he really doesn't see the need to quit smoking since what he heard  from his doctor that his heart problem didn't have  any thing to do with his lifestyle. Rocio states that he is not ready to quit smoking.  He had valve disease and no cardiac risk factors except the tobacco use.  He stated that when he wants to quit, he will be able to readdily quit.  Coyote stated that he is feeling stronger and able to do more daily activities. He enjoys golf and is finally going to attempt to play tomorrow (05/28/15) with a group of seniors. He has not played since August 2016.  Playing Golf has gone well and he will play next Tuesday also. He was able to work out in his yard alot yesterday dividing his iris. He said he has 1/2 of this exercise room size of iris plants which he started  with 6 plants.    Expected Outcomes Goal to increase his exercise ability.   -  Short term: Cristofher decides to quit smoking and is able to quit and maintain.cessation Jedrick will be able to tolerate golfing again since he is increasing his strengh and stamina from consistant exercise.   -   Parole Name 06/28/15 0818             Core Components/Risk Factors/Patient Goals Review   Review Jacquees reported today when asked if he wanted to meet individually with the REgistered Dietician "what for. My primary care MD told me to keep doing what I am doing. My cholesterol has never been above 200 and my good cholestrol is 40 and exercise will help with that and the LDL has always been low.  Kyjuan is playing golf and worked on dividing his iris in his yard yesterday.        Expected Outcomes Cont to play golf and work in his yard and get aerobic exercise.           Nutrition & Weight - Outcomes:     Pre Biometrics - 04/22/15 1424      Pre Biometrics   Height 6' 0.9" (1.852 m)   Weight 158 lb 3.2 oz (71.8 kg)   Waist Circumference 34.75 inches   Hip Circumference 40 inches   Waist to Hip Ratio 0.87 %   BMI (Calculated) 21       Nutrition:     Nutrition Therapy & Goals - 04/22/15 1501      Personal  Nutrition Goals   Personal Goal #1 To cont to eat healthy "I feel like I eat healthy so I don't see the need to meet individually with the [facility] Registered Dietician.      Intervention Plan   Intervention Prescribe, educate and counsel regarding individualized specific dietary modifications aiming towards targeted core components such as weight, hypertension, lipid management, diabetes, heart failure and other comorbidities.   Expected Outcomes Short Term Goal: Understand basic principles of dietary content, such as calories, fat, sodium, cholesterol and nutrients.;Long Term Goal: Adherence to prescribed nutrition plan.      Nutrition Discharge:   Education Questionnaire Score:   Goals reviewed with patient; copy given to patient.

## 2015-09-02 DIAGNOSIS — R39198 Other difficulties with micturition: Secondary | ICD-10-CM | POA: Diagnosis not present

## 2015-09-02 DIAGNOSIS — J302 Other seasonal allergic rhinitis: Secondary | ICD-10-CM | POA: Diagnosis not present

## 2015-09-02 DIAGNOSIS — F17200 Nicotine dependence, unspecified, uncomplicated: Secondary | ICD-10-CM | POA: Diagnosis not present

## 2015-09-02 DIAGNOSIS — N401 Enlarged prostate with lower urinary tract symptoms: Secondary | ICD-10-CM | POA: Diagnosis not present

## 2015-09-02 DIAGNOSIS — I359 Nonrheumatic aortic valve disorder, unspecified: Secondary | ICD-10-CM | POA: Diagnosis not present

## 2015-09-02 DIAGNOSIS — R5383 Other fatigue: Secondary | ICD-10-CM | POA: Diagnosis not present

## 2015-09-05 DIAGNOSIS — N401 Enlarged prostate with lower urinary tract symptoms: Secondary | ICD-10-CM | POA: Diagnosis not present

## 2015-09-05 DIAGNOSIS — R3912 Poor urinary stream: Secondary | ICD-10-CM | POA: Diagnosis not present

## 2015-09-05 DIAGNOSIS — I359 Nonrheumatic aortic valve disorder, unspecified: Secondary | ICD-10-CM | POA: Diagnosis not present

## 2015-09-05 DIAGNOSIS — Z125 Encounter for screening for malignant neoplasm of prostate: Secondary | ICD-10-CM | POA: Diagnosis not present

## 2015-09-05 DIAGNOSIS — R42 Dizziness and giddiness: Secondary | ICD-10-CM | POA: Diagnosis not present

## 2015-09-05 DIAGNOSIS — F17200 Nicotine dependence, unspecified, uncomplicated: Secondary | ICD-10-CM | POA: Diagnosis not present

## 2015-09-05 DIAGNOSIS — Z0000 Encounter for general adult medical examination without abnormal findings: Secondary | ICD-10-CM | POA: Diagnosis not present

## 2015-10-23 NOTE — Progress Notes (Signed)
This encounter was created in error - please disregard.

## 2015-11-26 ENCOUNTER — Ambulatory Visit: Payer: Self-pay

## 2015-11-27 ENCOUNTER — Ambulatory Visit (INDEPENDENT_AMBULATORY_CARE_PROVIDER_SITE_OTHER): Payer: PPO | Admitting: Urology

## 2015-11-27 ENCOUNTER — Encounter: Payer: Self-pay | Admitting: Urology

## 2015-11-27 VITALS — BP 132/72 | HR 89 | Ht 73.0 in | Wt 161.4 lb

## 2015-11-27 DIAGNOSIS — Z87448 Personal history of other diseases of urinary system: Secondary | ICD-10-CM

## 2015-11-27 DIAGNOSIS — R350 Frequency of micturition: Secondary | ICD-10-CM | POA: Diagnosis not present

## 2015-11-27 DIAGNOSIS — R351 Nocturia: Secondary | ICD-10-CM | POA: Diagnosis not present

## 2015-11-27 LAB — BLADDER SCAN AMB NON-IMAGING: SCAN RESULT: 0

## 2015-11-27 NOTE — Progress Notes (Signed)
11/27/2015 4:15 PM   Daniel Sims Sr. Jan 03, 1941 [id number]  Referring provider: Tracie Harrier, MD [address] [facility] [place], [place] [address]  Chief Complaint  Patient presents with  . Urinary Frequency    6 month follow up     HPI: Patient is a 75 year old Caucasian male who was found to have urethral strictures during a cystoscopic examination 6 months ago who presents today for follow-up.  His IPSS score today is 13, which is moderate lower urinary tract symptomatology. He is mostly dissatisfied with his quality life due to his urinary symptoms.  His PVR is 0 mL.  His previous PVR was 60 mL.    His major complaint today frequency (unsure of how many times he voids during the day), urgency, nocturia x 3, urge incontinence and a weak stream.  He has had these symptoms for two years.  He denies any dysuria, hematuria or suprapubic pain.   He currently taking tamsulosin 0.4 mg daily.   His has had urethral dilation.  He also denies any recent fevers, chills, nausea or vomiting.  He does not have a family history of PCa.  He states that he drinks approximately 3 pints of water, 2 glasses of iced tea, 16 ounces of OJ and 20 ounces a 7-Up daily.      IPSS    Row Name 11/27/15 1500         International Prostate Symptom Score   How often have you had the sensation of not emptying your bladder? Not at All     How often have you had to urinate less than every two hours? Less than half the time     How often have you found you stopped and started again several times when you urinated? Not at All     How often have you found it difficult to postpone urination? More than half the time     How often have you had a weak urinary stream? About half the time     How often have you had to strain to start urination? Less than 1 in 5 times     How many times did you typically get up at night to urinate? 3 Times     Total IPSS Score 13       Quality of  Life due to urinary symptoms   If you were to spend the rest of your life with your urinary condition just the way it is now how would you feel about that? Mostly Disatisfied        Score:  1-7 Mild 8-19 Moderate 20-35 Severe     PMH: Past Medical History:  Diagnosis Date  . Aortic stenosis    EF 50% echo 2010  . Asthma   . BPH (benign prostatic hyperplasia)   . C. difficile colitis   . Depression   . GERD (gastroesophageal reflux disease)   . Heart murmur   . Shortness of breath dyspnea    WITH EXERTION     Surgical History: Past Surgical History:  Procedure Laterality Date  . AORTIC VALVE REPLACEMENT N/A 11/08/2014   Procedure: AORTIC VALVE REPLACEMENT (AVR) WITH 23MM MAGNA EASE AORTIC  PORCINE/TISSUE HEART VALVE;  Surgeon: Gaye Pollack, MD;  Location: [facility];  Service: Open Heart Surgery;  Laterality: N/A;  . CARDIAC CATHETERIZATION N/A 10/19/2014   Procedure: Right/Left Heart Cath and Coronary Angiography;  Surgeon: Sherren Mocha, MD;  Location: [facility];  Service:  Cardiovascular;  Laterality: N/A;  . TEE WITHOUT CARDIOVERSION N/A 11/08/2014   Procedure: TRANSESOPHAGEAL ECHOCARDIOGRAM (TEE);  Surgeon: Gaye Pollack, MD;  Location: [facility];  Service: Open Heart Surgery;  Laterality: N/A;    Home Medications:    Medication List       Accurate as of 11/27/15  4:15 PM. Always use your most recent med list.          aspirin 81 MG EC tablet Take 1 tablet (81 mg total) by mouth daily.   citalopram 10 MG tablet Commonly known as:  CELEXA Take by mouth.   dextromethorphan-guaiFENesin 30-600 MG 12hr tablet Commonly known as:  MUCINEX DM Take 1 tablet by mouth 2 (two) times daily.   ferrous sulfate 325 (65 FE) MG tablet Take 1 tablet by mouth daily.   levocetirizine 5 MG tablet Commonly known as:  XYZAL Take 5 mg by mouth daily.   levocetirizine 5 MG tablet Commonly known as:  XYZAL Take by mouth.   montelukast 10 MG tablet Commonly known as:   SINGULAIR Take 10 mg by mouth at bedtime.   MULTI-VITAMINS Tabs Take 1 tablet by mouth daily.   naproxen sodium 220 MG tablet Commonly known as:  ANAPROX Take by mouth.   omeprazole 20 MG capsule Commonly known as:  PRILOSEC Take 20 mg by mouth daily.   omeprazole 20 MG capsule Commonly known as:  PRILOSEC Take by mouth.   sildenafil 20 MG tablet Commonly known as:  REVATIO   tamsulosin 0.4 MG Caps capsule Commonly known as:  FLOMAX Take 0.4 mg by mouth daily.   vitamin C 500 MG tablet Commonly known as:  ASCORBIC ACID Take 500 mg by mouth daily.   vitamin E 400 UNIT capsule Generic drug:  vitamin E Take 400 Units by mouth daily.       Allergies:  Allergies  Allergen Reactions  . Penicillin G Other (See Comments)    Has patient had a PCN reaction causing immediate rash, facial/tongue/throat swelling, SOB or lightheadedness with hypotension: No Has patient had a PCN reaction causing severe rash involving mucus membranes or skin necrosis: No Has patient had a PCN reaction that required hospitalization No Has patient had a PCN reaction occurring within the last 10 years: Yes If all of the above answers are "NO", then may proceed with Cephalosporin    Family History: Family History  Problem Relation Age of Onset  . Breast cancer Mother   . Lung cancer Mother   . Heart disease Father   . Diabetes Father   . Kidney cancer Neg Hx   . Kidney disease Neg Hx   . Prostate cancer Neg Hx     Social History:  reports that he has been smoking Cigarettes.  He has a 19.14 pack-year smoking history. He has never used smokeless tobacco. He reports that he does not drink alcohol or use drugs.  ROS: UROLOGY Frequent Urination?: Yes Hard to postpone urination?: Yes Burning/pain with urination?: No Get up at night to urinate?: Yes Leakage of urine?: Yes Urine stream starts and stops?: No Trouble starting stream?: No Do you have to strain to urinate?: No Blood in  urine?: No Urinary tract infection?: No Sexually transmitted disease?: No Injury to kidneys or bladder?: No Painful intercourse?: No Weak stream?: Yes Erection problems?: Yes Penile pain?: No  Gastrointestinal Nausea?: No Vomiting?: No Indigestion/heartburn?: No Diarrhea?: No Constipation?: No  Constitutional Fever: No Night sweats?: No Weight loss?: No Fatigue?: No  Skin Skin rash/lesions?:  No Itching?: No  Eyes Blurred vision?: No Double vision?: No  Ears/Nose/Throat Sore throat?: No Sinus problems?: Yes  Hematologic/Lymphatic Swollen glands?: No Easy bruising?: No  Cardiovascular Leg swelling?: No Chest pain?: No  Respiratory Cough?: Yes Shortness of breath?: No  Endocrine Excessive thirst?: No  Musculoskeletal Back pain?: No Joint pain?: No  Neurological Headaches?: No Dizziness?: No  Psychologic Depression?: No Anxiety?: No  Physical Exam: BP 132/72   Pulse 89   Ht 6\' 1"  ([id number] m)   Wt 161 lb 6.4 oz (73.2 kg)   BMI 21.29 kg/m   Constitutional: Well nourished. Alert and oriented, No acute distress. HEENT: [place] AT, moist mucus membranes. Trachea midline, no masses. Cardiovascular: No clubbing, cyanosis, or edema. Respiratory: Normal respiratory effort, no increased work of breathing. GI: Abdomen is soft, non tender, non distended, no abdominal masses. Liver and spleen not palpable.  No hernias appreciated.  Stool sample for occult testing is not indicated.   GU: No CVA tenderness.  No bladder fullness or masses.  Patient with normal phallus. Urethral meatus is patent.  No penile discharge. No penile lesions or rashes. Scrotum without lesions, cysts, rashes and/or edema.  Testicles are located scrotally bilaterally. No masses are appreciated in the testicles. Left and right epididymis are normal. Rectal: Patient with  normal sphincter tone. Anus and perineum without scarring or rashes. No rectal masses are appreciated. Prostate is  approximately 40 grams, no nodules are appreciated. Seminal vesicles are normal. Skin: No rashes, bruises or suspicious lesions. Lymph: No cervical or inguinal adenopathy. Neurologic: Grossly intact, no focal deficits, moving all 4 extremities. Psychiatric: Normal mood and affect.  Laboratory Data: Lab Results  Component Value Date   WBC 29.9 (H) 02/05/2015   HGB 14.6 02/05/2015   HCT 44.3 02/05/2015   MCV 97.6 02/05/2015   PLT 217 02/05/2015    Lab Results  Component Value Date   CREATININE 1.28 (H) 02/05/2015    Lab Results  Component Value Date   HGBA1C 5.5 11/06/2014    Lab Results  Component Value Date   AST 17 11/06/2014   Lab Results  Component Value Date   ALT 14 (L) 11/06/2014     Pertinent Imaging: Results for DAHIR, BARBATI SR. (MRN [id number]) as of 12/08/2015 16:16  Ref. Range 11/27/2015 15:14  Scan Result Unknown 0    Assessment & Plan:    1. Urinary frequency  - IPSS score is 13/4  - Continue conservative management, avoiding bladder irritants and timed voiding's  - Continue tamsulosin 0.4 mg daily   - RTC after completing sleep study  - BLADDER SCAN AMB NON-IMAGING  2. Nocturia  - I explained to the patient that nocturia is often multi-factorial and difficult to treat.  Sleeping disorders, heart conditions, peripheral vascular disease, diabetes, an enlarged prostate for men, an urethral stricture causing bladder outlet obstruction and/or certain medications can contribute to nocturia.  - I have suggested that the patient avoid caffeine after noon and alcohol in the evening.  He or she may also benefit from fluid restrictions after 6:00 in the evening and voiding just prior to bedtime.  - I have explained that research studies have showed that over 84% of patients with sleep apnea reported frequent nighttime urination.   With sleep apnea, oxygen decreases, carbon dioxide increases, the blood become more acidic, the heart rate drops and blood vessels  in the lung constrict.  The body is then alerted that something is very wrong. The sleeper must wake enough  to reopen the airway. By this time, the heart is racing and experiences a false signal of fluid overload. The heart excretes a hormone-like protein that tells the body to get rid of sodium and water, resulting in nocturia.  -  I also informed the patient that a recent study noted that decreasing sodium intake to 2.3 grams daily, if they don't have issues with hyponatremia, can also reduce the number of nightly voids  - He has an upcoming sleep study  3. History of urethral strictures  - resolved   Return for pending sleep study.  These notes generated with voice recognition software. I apologize for typographical errors.  Zara Council, [facility] [address] [place], [place] [address] [phone]

## 2015-12-02 DIAGNOSIS — I4891 Unspecified atrial fibrillation: Secondary | ICD-10-CM | POA: Diagnosis not present

## 2015-12-02 DIAGNOSIS — Z954 Presence of other heart-valve replacement: Secondary | ICD-10-CM | POA: Diagnosis not present

## 2015-12-02 DIAGNOSIS — I359 Nonrheumatic aortic valve disorder, unspecified: Secondary | ICD-10-CM | POA: Diagnosis not present

## 2015-12-26 DIAGNOSIS — R3912 Poor urinary stream: Secondary | ICD-10-CM | POA: Diagnosis not present

## 2015-12-26 DIAGNOSIS — Z125 Encounter for screening for malignant neoplasm of prostate: Secondary | ICD-10-CM | POA: Diagnosis not present

## 2015-12-26 DIAGNOSIS — F17200 Nicotine dependence, unspecified, uncomplicated: Secondary | ICD-10-CM | POA: Diagnosis not present

## 2015-12-26 DIAGNOSIS — N401 Enlarged prostate with lower urinary tract symptoms: Secondary | ICD-10-CM | POA: Diagnosis not present

## 2015-12-26 DIAGNOSIS — R42 Dizziness and giddiness: Secondary | ICD-10-CM | POA: Diagnosis not present

## 2015-12-26 DIAGNOSIS — I359 Nonrheumatic aortic valve disorder, unspecified: Secondary | ICD-10-CM | POA: Diagnosis not present

## 2015-12-26 DIAGNOSIS — Z0000 Encounter for general adult medical examination without abnormal findings: Secondary | ICD-10-CM | POA: Diagnosis not present

## 2016-01-02 DIAGNOSIS — Z23 Encounter for immunization: Secondary | ICD-10-CM | POA: Diagnosis not present

## 2016-01-02 DIAGNOSIS — I359 Nonrheumatic aortic valve disorder, unspecified: Secondary | ICD-10-CM | POA: Diagnosis not present

## 2016-01-02 DIAGNOSIS — Z954 Presence of other heart-valve replacement: Secondary | ICD-10-CM | POA: Diagnosis not present

## 2016-01-02 DIAGNOSIS — Z0000 Encounter for general adult medical examination without abnormal findings: Secondary | ICD-10-CM | POA: Diagnosis not present

## 2016-01-02 DIAGNOSIS — F17200 Nicotine dependence, unspecified, uncomplicated: Secondary | ICD-10-CM | POA: Diagnosis not present

## 2016-01-02 DIAGNOSIS — N529 Male erectile dysfunction, unspecified: Secondary | ICD-10-CM | POA: Diagnosis not present

## 2016-01-02 DIAGNOSIS — J0191 Acute recurrent sinusitis, unspecified: Secondary | ICD-10-CM | POA: Diagnosis not present

## 2016-02-04 DIAGNOSIS — H25813 Combined forms of age-related cataract, bilateral: Secondary | ICD-10-CM | POA: Diagnosis not present

## 2016-03-12 IMAGING — CR DG CHEST 2V
3 series · 3 of 3 positions shown · non-contrast
Comparison: Chest x-ray of 11/10/2014, and chest x-ray of
11/06/2014

CLINICAL DATA: Post aortic valve replacement on 11/08/2014, some
mid upper chest pain, smoking history

EXAM:
CHEST  2 VIEW

[view not recorded (1 of 3)]
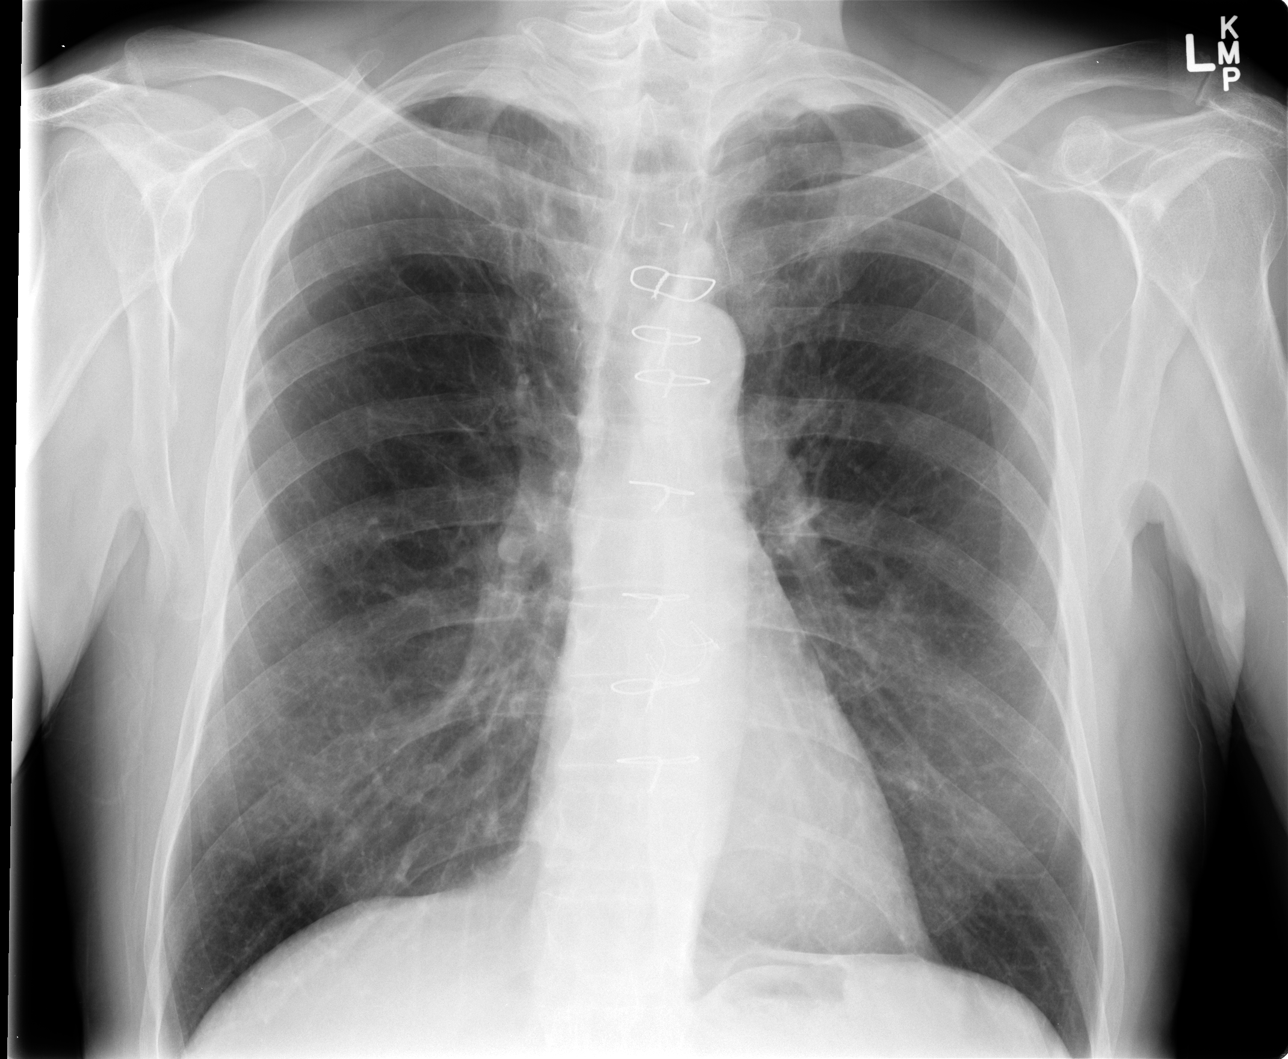

[view not recorded (2 of 3)]
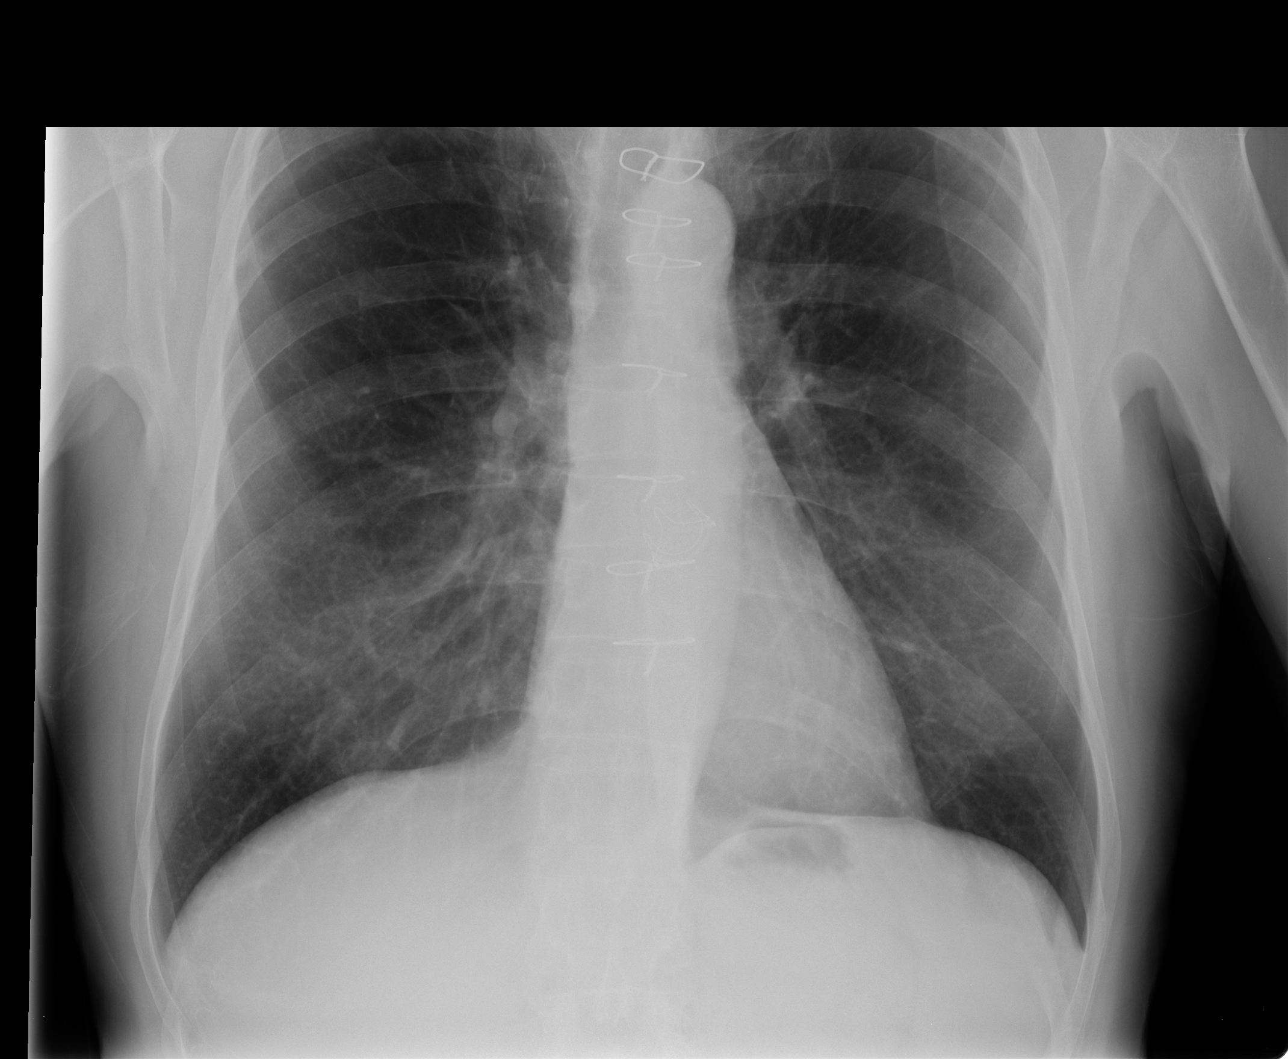

[view not recorded (3 of 3)]
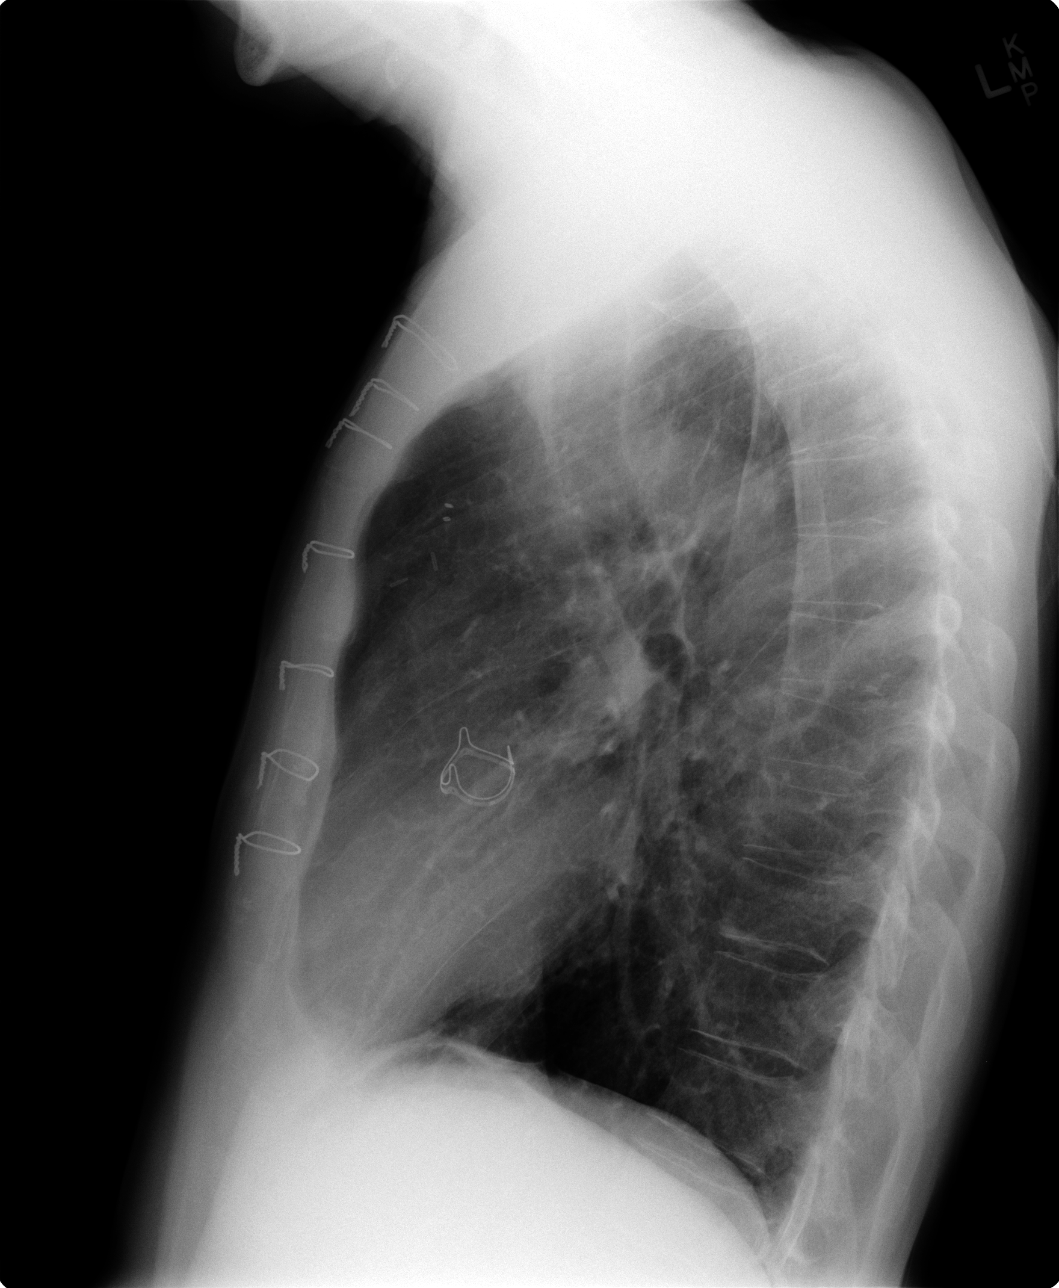

[3 of 3 positions shown; findings below may reference images not displayed]

FINDINGS: There is no evidence of parenchymal infiltrate and no pleural
effusion is seen. The lungs remain somewhat hyperaerated. Biapical
pleural thickening remains. An aortic valve replacement is noted.
Mediastinal and hilar contours are unremarkable and the heart is
within normal limits in size. Median sternotomy sutures are noted.
IMPRESSION: No active cardiopulmonary disease.

## 2016-06-01 DIAGNOSIS — I359 Nonrheumatic aortic valve disorder, unspecified: Secondary | ICD-10-CM | POA: Diagnosis not present

## 2016-06-01 DIAGNOSIS — Z954 Presence of other heart-valve replacement: Secondary | ICD-10-CM | POA: Diagnosis not present

## 2016-06-01 DIAGNOSIS — I779 Disorder of arteries and arterioles, unspecified: Secondary | ICD-10-CM | POA: Diagnosis not present

## 2016-07-02 DIAGNOSIS — Z0000 Encounter for general adult medical examination without abnormal findings: Secondary | ICD-10-CM | POA: Diagnosis not present

## 2016-07-02 DIAGNOSIS — J0191 Acute recurrent sinusitis, unspecified: Secondary | ICD-10-CM | POA: Diagnosis not present

## 2016-07-02 DIAGNOSIS — I359 Nonrheumatic aortic valve disorder, unspecified: Secondary | ICD-10-CM | POA: Diagnosis not present

## 2016-07-02 DIAGNOSIS — Z954 Presence of other heart-valve replacement: Secondary | ICD-10-CM | POA: Diagnosis not present

## 2016-07-02 DIAGNOSIS — Z87898 Personal history of other specified conditions: Secondary | ICD-10-CM | POA: Diagnosis not present

## 2016-07-02 DIAGNOSIS — N529 Male erectile dysfunction, unspecified: Secondary | ICD-10-CM | POA: Diagnosis not present

## 2016-07-02 DIAGNOSIS — F17200 Nicotine dependence, unspecified, uncomplicated: Secondary | ICD-10-CM | POA: Diagnosis not present

## 2016-07-08 DIAGNOSIS — Z0000 Encounter for general adult medical examination without abnormal findings: Secondary | ICD-10-CM | POA: Diagnosis not present

## 2016-07-08 DIAGNOSIS — N401 Enlarged prostate with lower urinary tract symptoms: Secondary | ICD-10-CM | POA: Diagnosis not present

## 2016-07-08 DIAGNOSIS — Z125 Encounter for screening for malignant neoplasm of prostate: Secondary | ICD-10-CM | POA: Diagnosis not present

## 2016-07-08 DIAGNOSIS — R3912 Poor urinary stream: Secondary | ICD-10-CM | POA: Diagnosis not present

## 2016-07-08 DIAGNOSIS — F17200 Nicotine dependence, unspecified, uncomplicated: Secondary | ICD-10-CM | POA: Diagnosis not present

## 2016-07-08 DIAGNOSIS — I359 Nonrheumatic aortic valve disorder, unspecified: Secondary | ICD-10-CM | POA: Diagnosis not present

## 2016-07-08 DIAGNOSIS — J302 Other seasonal allergic rhinitis: Secondary | ICD-10-CM | POA: Diagnosis not present

## 2016-07-10 DIAGNOSIS — I6523 Occlusion and stenosis of bilateral carotid arteries: Secondary | ICD-10-CM | POA: Diagnosis not present

## 2016-07-10 DIAGNOSIS — I779 Disorder of arteries and arterioles, unspecified: Secondary | ICD-10-CM | POA: Diagnosis not present

## 2016-07-10 DIAGNOSIS — I359 Nonrheumatic aortic valve disorder, unspecified: Secondary | ICD-10-CM | POA: Diagnosis not present

## 2016-12-31 DIAGNOSIS — Z0000 Encounter for general adult medical examination without abnormal findings: Secondary | ICD-10-CM | POA: Diagnosis not present

## 2016-12-31 DIAGNOSIS — N401 Enlarged prostate with lower urinary tract symptoms: Secondary | ICD-10-CM | POA: Diagnosis not present

## 2016-12-31 DIAGNOSIS — R3912 Poor urinary stream: Secondary | ICD-10-CM | POA: Diagnosis not present

## 2016-12-31 DIAGNOSIS — Z125 Encounter for screening for malignant neoplasm of prostate: Secondary | ICD-10-CM | POA: Diagnosis not present

## 2016-12-31 DIAGNOSIS — F17200 Nicotine dependence, unspecified, uncomplicated: Secondary | ICD-10-CM | POA: Diagnosis not present

## 2016-12-31 DIAGNOSIS — J302 Other seasonal allergic rhinitis: Secondary | ICD-10-CM | POA: Diagnosis not present

## 2017-01-05 DIAGNOSIS — N401 Enlarged prostate with lower urinary tract symptoms: Secondary | ICD-10-CM | POA: Diagnosis not present

## 2017-01-05 DIAGNOSIS — Z954 Presence of other heart-valve replacement: Secondary | ICD-10-CM | POA: Diagnosis not present

## 2017-01-05 DIAGNOSIS — R3912 Poor urinary stream: Secondary | ICD-10-CM | POA: Diagnosis not present

## 2017-01-05 DIAGNOSIS — N529 Male erectile dysfunction, unspecified: Secondary | ICD-10-CM | POA: Diagnosis not present

## 2017-01-05 DIAGNOSIS — J302 Other seasonal allergic rhinitis: Secondary | ICD-10-CM | POA: Diagnosis not present

## 2017-01-05 DIAGNOSIS — I359 Nonrheumatic aortic valve disorder, unspecified: Secondary | ICD-10-CM | POA: Diagnosis not present

## 2017-01-05 DIAGNOSIS — F17200 Nicotine dependence, unspecified, uncomplicated: Secondary | ICD-10-CM | POA: Diagnosis not present

## 2017-01-08 DIAGNOSIS — I359 Nonrheumatic aortic valve disorder, unspecified: Secondary | ICD-10-CM | POA: Diagnosis not present

## 2017-02-25 DIAGNOSIS — H25813 Combined forms of age-related cataract, bilateral: Secondary | ICD-10-CM | POA: Diagnosis not present

## 2017-04-02 DIAGNOSIS — R05 Cough: Secondary | ICD-10-CM | POA: Diagnosis not present

## 2017-04-02 DIAGNOSIS — F17200 Nicotine dependence, unspecified, uncomplicated: Secondary | ICD-10-CM | POA: Diagnosis not present

## 2017-04-02 DIAGNOSIS — M791 Myalgia, unspecified site: Secondary | ICD-10-CM | POA: Diagnosis not present

## 2017-04-02 DIAGNOSIS — R531 Weakness: Secondary | ICD-10-CM | POA: Diagnosis not present

## 2017-04-02 DIAGNOSIS — Z954 Presence of other heart-valve replacement: Secondary | ICD-10-CM | POA: Diagnosis not present

## 2017-04-02 DIAGNOSIS — R509 Fever, unspecified: Secondary | ICD-10-CM | POA: Diagnosis not present

## 2017-04-02 DIAGNOSIS — I359 Nonrheumatic aortic valve disorder, unspecified: Secondary | ICD-10-CM | POA: Diagnosis not present

## 2017-07-02 DIAGNOSIS — N401 Enlarged prostate with lower urinary tract symptoms: Secondary | ICD-10-CM | POA: Diagnosis not present

## 2017-07-02 DIAGNOSIS — I359 Nonrheumatic aortic valve disorder, unspecified: Secondary | ICD-10-CM | POA: Diagnosis not present

## 2017-07-02 DIAGNOSIS — N529 Male erectile dysfunction, unspecified: Secondary | ICD-10-CM | POA: Diagnosis not present

## 2017-07-02 DIAGNOSIS — R3912 Poor urinary stream: Secondary | ICD-10-CM | POA: Diagnosis not present

## 2017-07-02 DIAGNOSIS — F17200 Nicotine dependence, unspecified, uncomplicated: Secondary | ICD-10-CM | POA: Diagnosis not present

## 2017-07-02 DIAGNOSIS — Z954 Presence of other heart-valve replacement: Secondary | ICD-10-CM | POA: Diagnosis not present

## 2017-07-02 DIAGNOSIS — J302 Other seasonal allergic rhinitis: Secondary | ICD-10-CM | POA: Diagnosis not present

## 2017-07-09 DIAGNOSIS — Z954 Presence of other heart-valve replacement: Secondary | ICD-10-CM | POA: Diagnosis not present

## 2017-07-09 DIAGNOSIS — F17200 Nicotine dependence, unspecified, uncomplicated: Secondary | ICD-10-CM | POA: Diagnosis not present

## 2017-07-09 DIAGNOSIS — J302 Other seasonal allergic rhinitis: Secondary | ICD-10-CM | POA: Diagnosis not present

## 2017-07-09 DIAGNOSIS — N529 Male erectile dysfunction, unspecified: Secondary | ICD-10-CM | POA: Diagnosis not present

## 2017-07-09 DIAGNOSIS — Z0000 Encounter for general adult medical examination without abnormal findings: Secondary | ICD-10-CM | POA: Diagnosis not present

## 2017-07-09 DIAGNOSIS — I359 Nonrheumatic aortic valve disorder, unspecified: Secondary | ICD-10-CM | POA: Diagnosis not present

## 2017-07-12 ENCOUNTER — Telehealth: Payer: Self-pay | Admitting: *Deleted

## 2017-07-12 DIAGNOSIS — Z122 Encounter for screening for malignant neoplasm of respiratory organs: Secondary | ICD-10-CM

## 2017-07-12 DIAGNOSIS — Z87891 Personal history of nicotine dependence: Secondary | ICD-10-CM

## 2017-07-12 NOTE — Telephone Encounter (Signed)
Received referral for initial lung cancer screening scan. Contacted patient and obtained smoking history,(current, 45.75 pack year) as well as answering questions related to screening process. Patient denies signs of lung cancer such as weight loss or hemoptysis. Patient denies comorbidity that would prevent curative treatment if lung cancer were found. Patient is scheduled for shared decision making visit and CT scan on 07/16/17.

## 2017-07-15 ENCOUNTER — Telehealth: Payer: Self-pay | Admitting: Nurse Practitioner

## 2017-07-16 ENCOUNTER — Encounter (INDEPENDENT_AMBULATORY_CARE_PROVIDER_SITE_OTHER): Payer: Self-pay

## 2017-07-16 ENCOUNTER — Ambulatory Visit
Admission: RE | Admit: 2017-07-16 | Discharge: 2017-07-16 | Disposition: A | Discharge status: Home / Self Care | Payer: PPO | Source: Ambulatory Visit | Attending: Nurse Practitioner | Admitting: Nurse Practitioner

## 2017-07-16 ENCOUNTER — Inpatient Hospital Stay: Payer: PPO | Attending: Nurse Practitioner | Admitting: Nurse Practitioner

## 2017-07-16 DIAGNOSIS — I2510 Atherosclerotic heart disease of native coronary artery without angina pectoris: Secondary | ICD-10-CM | POA: Insufficient documentation

## 2017-07-16 DIAGNOSIS — F17210 Nicotine dependence, cigarettes, uncomplicated: Secondary | ICD-10-CM | POA: Diagnosis not present

## 2017-07-16 DIAGNOSIS — I359 Nonrheumatic aortic valve disorder, unspecified: Secondary | ICD-10-CM | POA: Diagnosis not present

## 2017-07-16 DIAGNOSIS — N289 Disorder of kidney and ureter, unspecified: Secondary | ICD-10-CM | POA: Insufficient documentation

## 2017-07-16 DIAGNOSIS — N529 Male erectile dysfunction, unspecified: Secondary | ICD-10-CM | POA: Diagnosis not present

## 2017-07-16 DIAGNOSIS — Z87891 Personal history of nicotine dependence: Secondary | ICD-10-CM | POA: Insufficient documentation

## 2017-07-16 DIAGNOSIS — Z122 Encounter for screening for malignant neoplasm of respiratory organs: Secondary | ICD-10-CM | POA: Insufficient documentation

## 2017-07-16 DIAGNOSIS — N401 Enlarged prostate with lower urinary tract symptoms: Secondary | ICD-10-CM | POA: Diagnosis not present

## 2017-07-16 DIAGNOSIS — J432 Centrilobular emphysema: Secondary | ICD-10-CM | POA: Insufficient documentation

## 2017-07-16 DIAGNOSIS — J438 Other emphysema: Secondary | ICD-10-CM | POA: Insufficient documentation

## 2017-07-16 DIAGNOSIS — Z9889 Other specified postprocedural states: Secondary | ICD-10-CM | POA: Diagnosis not present

## 2017-07-16 DIAGNOSIS — I700 Atherosclerosis of aorta: Secondary | ICD-10-CM | POA: Insufficient documentation

## 2017-07-16 DIAGNOSIS — Z954 Presence of other heart-valve replacement: Secondary | ICD-10-CM | POA: Diagnosis not present

## 2017-07-16 DIAGNOSIS — R3912 Poor urinary stream: Secondary | ICD-10-CM | POA: Diagnosis not present

## 2017-07-16 NOTE — Progress Notes (Signed)
In accordance with CMS guidelines, patient has met eligibility criteria including age, absence of signs or symptoms of lung cancer.  Social History   Tobacco Use  . Smoking status: Current Every Day Smoker    Packs/day: 0.33    Years: 58.00    Pack years: 19.14    Types: Cigarettes  . Smokeless tobacco: Never Used  Substance Use Topics  . Alcohol use: No    Alcohol/week: 0.0 oz  . Drug use: No      A shared decision-making session was conducted prior to the performance of CT scan. This includes one or more decision aids, includes benefits and harms of screening, follow-up diagnostic testing, over-diagnosis, false positive rate, and total radiation exposure.   Counseling on the importance of adherence to annual lung cancer LDCT screening, impact of co-morbidities, and ability or willingness to undergo diagnosis and treatment is imperative for compliance of the program.   Counseling on the importance of continued smoking cessation for former smokers; the importance of smoking cessation for current smokers, and information about tobacco cessation interventions have been given to patient including [place] and 1800 quit [place] programs.   Written order for lung cancer screening with LDCT has been given to the patient and any and all questions have been answered to the best of my abilities.    Yearly follow up will be coordinated by Burgess Estelle, Thoracic Navigator.  Beckey Rutter, DNP, AGNP-C [place] at [facility] [phone] (work cell) [phone] (office) 07/16/17 10:47 AM

## 2017-07-19 ENCOUNTER — Telehealth: Payer: Self-pay | Admitting: *Deleted

## 2017-07-19 NOTE — Telephone Encounter (Signed)
Notified patient of LDCT lung cancer screening program results with recommendation for 6 month follow up imaging. Also notified of incidental findings noted below and is encouraged to discuss further with PCP who will receive a copy of this note and/or the CT report. Patient verbalizes understanding. (please note that renal lesion noted appears to be present and same size on imaging from 2008. Message left with radiology to addend report) IMPRESSION: 1. Lung-RADS 3-S, probably benign findings. Short-term follow-up in 6 months is recommended with repeat low-dose chest CT without contrast (please use the following order, "CT CHEST LCS NODULE FOLLOW-UP W/O CM"). 2. The "S" modifier above refers to potentially clinically significant non lung cancer related findings. Specifically, exophytic 1.6 cm lateral upper left renal lesion with indeterminate density, cannot exclude renal cell carcinoma. Recommend further evaluation with MRI (preferred) or CT abdomen without and with IV contrast. 3. One vessel coronary atherosclerosis.  Aortic Atherosclerosis (ICD10-I70.0) and Emphysema (ICD10-J43.9).

## 2017-07-19 NOTE — Telephone Encounter (Signed)
Called report  IMPRESSION: 1. Lung-RADS 3-S, probably benign findings. Short-term follow-up in 6 months is recommended with repeat low-dose chest CT without contrast (please use the following order, "CT CHEST LCS NODULE FOLLOW-UP W/O CM"). 2. The "S" modifier above refers to potentially clinically significant non lung cancer related findings. Specifically, exophytic 1.6 cm lateral upper left renal lesion with indeterminate density, cannot exclude renal cell carcinoma. Recommend further evaluation with MRI (preferred) or CT abdomen without and with IV contrast. 3. One vessel coronary atherosclerosis.  Aortic Atherosclerosis (ICD10-I70.0) and Emphysema (ICD10-J43.9).  These results will be called to the ordering clinician or representative by the Radiologist Assistant, and communication documented in the PACS or zVision Dashboard.   Electronically Signed   By: Ilona Sorrel M.D.   On: 07/17/2017 18:36

## 2017-12-25 ENCOUNTER — Telehealth: Payer: Self-pay

## 2017-12-25 NOTE — Telephone Encounter (Signed)
Call pt regarding lung screening. Left message. 

## 2017-12-27 ENCOUNTER — Other Ambulatory Visit: Payer: Self-pay | Admitting: *Deleted

## 2017-12-27 DIAGNOSIS — Z122 Encounter for screening for malignant neoplasm of respiratory organs: Secondary | ICD-10-CM

## 2017-12-27 DIAGNOSIS — R918 Other nonspecific abnormal finding of lung field: Secondary | ICD-10-CM

## 2017-12-27 DIAGNOSIS — Z87891 Personal history of nicotine dependence: Secondary | ICD-10-CM

## 2017-12-31 DIAGNOSIS — I359 Nonrheumatic aortic valve disorder, unspecified: Secondary | ICD-10-CM | POA: Diagnosis not present

## 2017-12-31 DIAGNOSIS — I10 Essential (primary) hypertension: Secondary | ICD-10-CM | POA: Diagnosis not present

## 2017-12-31 DIAGNOSIS — F17200 Nicotine dependence, unspecified, uncomplicated: Secondary | ICD-10-CM | POA: Diagnosis not present

## 2017-12-31 DIAGNOSIS — Z0000 Encounter for general adult medical examination without abnormal findings: Secondary | ICD-10-CM | POA: Diagnosis not present

## 2017-12-31 DIAGNOSIS — N529 Male erectile dysfunction, unspecified: Secondary | ICD-10-CM | POA: Diagnosis not present

## 2017-12-31 DIAGNOSIS — Z954 Presence of other heart-valve replacement: Secondary | ICD-10-CM | POA: Diagnosis not present

## 2017-12-31 DIAGNOSIS — J302 Other seasonal allergic rhinitis: Secondary | ICD-10-CM | POA: Diagnosis not present

## 2017-12-31 DIAGNOSIS — Z125 Encounter for screening for malignant neoplasm of prostate: Secondary | ICD-10-CM | POA: Diagnosis not present

## 2017-12-31 DIAGNOSIS — Z1329 Encounter for screening for other suspected endocrine disorder: Secondary | ICD-10-CM | POA: Diagnosis not present

## 2018-01-11 DIAGNOSIS — Z954 Presence of other heart-valve replacement: Secondary | ICD-10-CM | POA: Diagnosis not present

## 2018-01-11 DIAGNOSIS — I359 Nonrheumatic aortic valve disorder, unspecified: Secondary | ICD-10-CM | POA: Diagnosis not present

## 2018-01-11 DIAGNOSIS — Z9889 Other specified postprocedural states: Secondary | ICD-10-CM | POA: Diagnosis not present

## 2018-01-13 DIAGNOSIS — N529 Male erectile dysfunction, unspecified: Secondary | ICD-10-CM | POA: Diagnosis not present

## 2018-01-13 DIAGNOSIS — Z9889 Other specified postprocedural states: Secondary | ICD-10-CM | POA: Diagnosis not present

## 2018-01-13 DIAGNOSIS — I359 Nonrheumatic aortic valve disorder, unspecified: Secondary | ICD-10-CM | POA: Diagnosis not present

## 2018-01-13 DIAGNOSIS — R3912 Poor urinary stream: Secondary | ICD-10-CM | POA: Diagnosis not present

## 2018-01-13 DIAGNOSIS — Z954 Presence of other heart-valve replacement: Secondary | ICD-10-CM | POA: Diagnosis not present

## 2018-01-13 DIAGNOSIS — J302 Other seasonal allergic rhinitis: Secondary | ICD-10-CM | POA: Diagnosis not present

## 2018-01-13 DIAGNOSIS — N401 Enlarged prostate with lower urinary tract symptoms: Secondary | ICD-10-CM | POA: Diagnosis not present

## 2018-01-13 DIAGNOSIS — F17200 Nicotine dependence, unspecified, uncomplicated: Secondary | ICD-10-CM | POA: Diagnosis not present

## 2018-01-13 DIAGNOSIS — E786 Lipoprotein deficiency: Secondary | ICD-10-CM | POA: Diagnosis not present

## 2018-01-13 DIAGNOSIS — N289 Disorder of kidney and ureter, unspecified: Secondary | ICD-10-CM | POA: Diagnosis not present

## 2018-01-17 ENCOUNTER — Ambulatory Visit
Admission: RE | Admit: 2018-01-17 | Discharge: 2018-01-17 | Disposition: A | Discharge status: Home / Self Care | Payer: PPO | Source: Ambulatory Visit | Attending: Oncology | Admitting: Oncology

## 2018-01-17 DIAGNOSIS — R918 Other nonspecific abnormal finding of lung field: Secondary | ICD-10-CM | POA: Diagnosis not present

## 2018-01-17 DIAGNOSIS — Z87891 Personal history of nicotine dependence: Secondary | ICD-10-CM | POA: Insufficient documentation

## 2018-01-17 DIAGNOSIS — J439 Emphysema, unspecified: Secondary | ICD-10-CM | POA: Diagnosis not present

## 2018-01-17 DIAGNOSIS — Z122 Encounter for screening for malignant neoplasm of respiratory organs: Secondary | ICD-10-CM | POA: Diagnosis not present

## 2018-01-18 ENCOUNTER — Encounter: Payer: Self-pay | Admitting: *Deleted

## 2018-01-28 ENCOUNTER — Other Ambulatory Visit: Payer: Self-pay | Admitting: Internal Medicine

## 2018-01-28 ENCOUNTER — Other Ambulatory Visit (HOSPITAL_COMMUNITY): Payer: Self-pay | Admitting: Internal Medicine

## 2018-01-28 DIAGNOSIS — J449 Chronic obstructive pulmonary disease, unspecified: Secondary | ICD-10-CM

## 2018-02-04 ENCOUNTER — Ambulatory Visit: Admission: RE | Admit: 2018-02-04 | Payer: PPO | Source: Ambulatory Visit

## 2018-03-28 DIAGNOSIS — H25813 Combined forms of age-related cataract, bilateral: Secondary | ICD-10-CM | POA: Diagnosis not present

## 2018-06-30 DIAGNOSIS — R300 Dysuria: Secondary | ICD-10-CM | POA: Diagnosis not present

## 2018-06-30 DIAGNOSIS — R39198 Other difficulties with micturition: Secondary | ICD-10-CM | POA: Diagnosis not present

## 2018-07-04 DIAGNOSIS — I359 Nonrheumatic aortic valve disorder, unspecified: Secondary | ICD-10-CM | POA: Diagnosis not present

## 2018-07-05 DIAGNOSIS — I359 Nonrheumatic aortic valve disorder, unspecified: Secondary | ICD-10-CM | POA: Diagnosis not present

## 2018-07-05 DIAGNOSIS — F17200 Nicotine dependence, unspecified, uncomplicated: Secondary | ICD-10-CM | POA: Diagnosis not present

## 2018-07-05 DIAGNOSIS — Z9889 Other specified postprocedural states: Secondary | ICD-10-CM | POA: Diagnosis not present

## 2018-07-05 DIAGNOSIS — R3912 Poor urinary stream: Secondary | ICD-10-CM | POA: Diagnosis not present

## 2018-07-05 DIAGNOSIS — J302 Other seasonal allergic rhinitis: Secondary | ICD-10-CM | POA: Diagnosis not present

## 2018-07-05 DIAGNOSIS — N401 Enlarged prostate with lower urinary tract symptoms: Secondary | ICD-10-CM | POA: Diagnosis not present

## 2018-07-05 DIAGNOSIS — Z954 Presence of other heart-valve replacement: Secondary | ICD-10-CM | POA: Diagnosis not present

## 2018-07-05 DIAGNOSIS — N289 Disorder of kidney and ureter, unspecified: Secondary | ICD-10-CM | POA: Diagnosis not present

## 2018-07-05 DIAGNOSIS — I10 Essential (primary) hypertension: Secondary | ICD-10-CM | POA: Diagnosis not present

## 2018-07-08 DIAGNOSIS — R3912 Poor urinary stream: Secondary | ICD-10-CM | POA: Diagnosis not present

## 2018-07-08 DIAGNOSIS — I359 Nonrheumatic aortic valve disorder, unspecified: Secondary | ICD-10-CM | POA: Diagnosis not present

## 2018-07-08 DIAGNOSIS — N401 Enlarged prostate with lower urinary tract symptoms: Secondary | ICD-10-CM | POA: Diagnosis not present

## 2018-07-08 DIAGNOSIS — Z954 Presence of other heart-valve replacement: Secondary | ICD-10-CM | POA: Diagnosis not present

## 2018-07-12 DIAGNOSIS — F17200 Nicotine dependence, unspecified, uncomplicated: Secondary | ICD-10-CM | POA: Diagnosis not present

## 2018-07-12 DIAGNOSIS — Z0000 Encounter for general adult medical examination without abnormal findings: Secondary | ICD-10-CM | POA: Diagnosis not present

## 2018-07-12 DIAGNOSIS — Z954 Presence of other heart-valve replacement: Secondary | ICD-10-CM | POA: Diagnosis not present

## 2018-07-12 DIAGNOSIS — J302 Other seasonal allergic rhinitis: Secondary | ICD-10-CM | POA: Diagnosis not present

## 2018-07-12 DIAGNOSIS — N401 Enlarged prostate with lower urinary tract symptoms: Secondary | ICD-10-CM | POA: Diagnosis not present

## 2018-07-12 DIAGNOSIS — R3912 Poor urinary stream: Secondary | ICD-10-CM | POA: Diagnosis not present

## 2018-07-12 DIAGNOSIS — I359 Nonrheumatic aortic valve disorder, unspecified: Secondary | ICD-10-CM | POA: Diagnosis not present

## 2018-12-19 ENCOUNTER — Other Ambulatory Visit: Payer: Self-pay

## 2018-12-19 DIAGNOSIS — Z20822 Contact with and (suspected) exposure to covid-19: Secondary | ICD-10-CM

## 2018-12-20 LAB — NOVEL CORONAVIRUS, NAA: SARS-CoV-2, NAA: NOT DETECTED

## 2019-01-03 ENCOUNTER — Telehealth: Payer: Self-pay | Admitting: *Deleted

## 2019-01-03 DIAGNOSIS — Z87891 Personal history of nicotine dependence: Secondary | ICD-10-CM

## 2019-01-03 NOTE — Telephone Encounter (Signed)
Patient has been notified that lung cancer screening CT scan is due currently or will be in near future. Confirmed that patient is within the appropriate age range, and asymptomatic, (no signs or symptoms of lung cancer). Patient denies illness that would prevent curative treatment for lung cancer if found. Verified smoking history. He is a current smoker that smokes 7 cigarettes per day. Patient is agreeable for CT scan being schedule. He would prefer the CT scan to be scheduled any day of the week except for Thursday.

## 2019-01-05 NOTE — Addendum Note (Signed)
Addended by: Lieutenant Diego on: 01/05/2019 09:46 AM   Modules accepted: Orders

## 2019-01-05 NOTE — Telephone Encounter (Signed)
Smoking history: current, 46 pack year 

## 2019-01-06 DIAGNOSIS — I359 Nonrheumatic aortic valve disorder, unspecified: Secondary | ICD-10-CM | POA: Diagnosis not present

## 2019-01-06 DIAGNOSIS — R5383 Other fatigue: Secondary | ICD-10-CM | POA: Diagnosis not present

## 2019-01-06 DIAGNOSIS — Z954 Presence of other heart-valve replacement: Secondary | ICD-10-CM | POA: Diagnosis not present

## 2019-01-18 ENCOUNTER — Ambulatory Visit
Admission: RE | Admit: 2019-01-18 | Discharge: 2019-01-18 | Disposition: A | Discharge status: Home / Self Care | Payer: PPO | Source: Ambulatory Visit | Attending: Oncology | Admitting: Oncology

## 2019-01-18 ENCOUNTER — Other Ambulatory Visit: Payer: Self-pay

## 2019-01-18 DIAGNOSIS — I700 Atherosclerosis of aorta: Secondary | ICD-10-CM | POA: Insufficient documentation

## 2019-01-18 DIAGNOSIS — Z954 Presence of other heart-valve replacement: Secondary | ICD-10-CM | POA: Diagnosis not present

## 2019-01-18 DIAGNOSIS — J439 Emphysema, unspecified: Secondary | ICD-10-CM | POA: Insufficient documentation

## 2019-01-18 DIAGNOSIS — J302 Other seasonal allergic rhinitis: Secondary | ICD-10-CM | POA: Diagnosis not present

## 2019-01-18 DIAGNOSIS — Z122 Encounter for screening for malignant neoplasm of respiratory organs: Secondary | ICD-10-CM | POA: Insufficient documentation

## 2019-01-18 DIAGNOSIS — K8020 Calculus of gallbladder without cholecystitis without obstruction: Secondary | ICD-10-CM | POA: Diagnosis not present

## 2019-01-18 DIAGNOSIS — Z136 Encounter for screening for cardiovascular disorders: Secondary | ICD-10-CM | POA: Diagnosis not present

## 2019-01-18 DIAGNOSIS — N401 Enlarged prostate with lower urinary tract symptoms: Secondary | ICD-10-CM | POA: Diagnosis not present

## 2019-01-18 DIAGNOSIS — Z0000 Encounter for general adult medical examination without abnormal findings: Secondary | ICD-10-CM | POA: Diagnosis not present

## 2019-01-18 DIAGNOSIS — Z125 Encounter for screening for malignant neoplasm of prostate: Secondary | ICD-10-CM | POA: Diagnosis not present

## 2019-01-18 DIAGNOSIS — F17200 Nicotine dependence, unspecified, uncomplicated: Secondary | ICD-10-CM | POA: Diagnosis not present

## 2019-01-18 DIAGNOSIS — I359 Nonrheumatic aortic valve disorder, unspecified: Secondary | ICD-10-CM | POA: Diagnosis not present

## 2019-01-18 DIAGNOSIS — F17210 Nicotine dependence, cigarettes, uncomplicated: Secondary | ICD-10-CM | POA: Diagnosis not present

## 2019-01-18 DIAGNOSIS — Z87891 Personal history of nicotine dependence: Secondary | ICD-10-CM

## 2019-01-18 DIAGNOSIS — R3912 Poor urinary stream: Secondary | ICD-10-CM | POA: Diagnosis not present

## 2019-01-23 ENCOUNTER — Telehealth: Payer: Self-pay | Admitting: *Deleted

## 2019-01-23 NOTE — Telephone Encounter (Signed)
Notified patient of LDCT lung cancer screening program results with recommendation for 6 month follow up imaging. Also notified of incidental findings noted below and is encouraged to discuss further with PCP who will receive a copy of this note and/or the CT report. Patient verbalizes understanding.   IMPRESSION: 1. Lung-RADS 3, probably benign findings. Short-term follow-up in 6 months is recommended with repeat low-dose chest CT without contrast (please use the following order, "CT CHEST LCS NODULE FOLLOW-UP W/O CM"). New left upper lobe endobronchial nodule versus area of mucoid impaction. 2. Aortic atherosclerosis (ICD10-I70.0) and emphysema (ICD10-J43.9). 3. Similar size of a upper pole left renal lesion, indeterminate but favored to represent a hemorrhagic/proteinaceous cyst. Recommend attention on follow-up. 4. Cholelithiasis.

## 2019-01-25 DIAGNOSIS — L989 Disorder of the skin and subcutaneous tissue, unspecified: Secondary | ICD-10-CM | POA: Diagnosis not present

## 2019-01-25 DIAGNOSIS — R3912 Poor urinary stream: Secondary | ICD-10-CM | POA: Diagnosis not present

## 2019-01-25 DIAGNOSIS — F17200 Nicotine dependence, unspecified, uncomplicated: Secondary | ICD-10-CM | POA: Diagnosis not present

## 2019-01-25 DIAGNOSIS — J3489 Other specified disorders of nose and nasal sinuses: Secondary | ICD-10-CM | POA: Diagnosis not present

## 2019-01-25 DIAGNOSIS — N401 Enlarged prostate with lower urinary tract symptoms: Secondary | ICD-10-CM | POA: Diagnosis not present

## 2019-01-25 DIAGNOSIS — J449 Chronic obstructive pulmonary disease, unspecified: Secondary | ICD-10-CM | POA: Diagnosis not present

## 2019-01-25 DIAGNOSIS — I359 Nonrheumatic aortic valve disorder, unspecified: Secondary | ICD-10-CM | POA: Diagnosis not present

## 2019-02-02 DIAGNOSIS — M79671 Pain in right foot: Secondary | ICD-10-CM | POA: Diagnosis not present

## 2019-02-02 DIAGNOSIS — D2371 Other benign neoplasm of skin of right lower limb, including hip: Secondary | ICD-10-CM | POA: Diagnosis not present

## 2019-02-16 DIAGNOSIS — M79671 Pain in right foot: Secondary | ICD-10-CM | POA: Diagnosis not present

## 2019-02-16 DIAGNOSIS — D2371 Other benign neoplasm of skin of right lower limb, including hip: Secondary | ICD-10-CM | POA: Diagnosis not present

## 2019-07-13 DIAGNOSIS — F17200 Nicotine dependence, unspecified, uncomplicated: Secondary | ICD-10-CM | POA: Diagnosis not present

## 2019-07-13 DIAGNOSIS — Z9889 Other specified postprocedural states: Secondary | ICD-10-CM | POA: Diagnosis not present

## 2019-07-13 DIAGNOSIS — I359 Nonrheumatic aortic valve disorder, unspecified: Secondary | ICD-10-CM | POA: Diagnosis not present

## 2019-07-13 DIAGNOSIS — Z954 Presence of other heart-valve replacement: Secondary | ICD-10-CM | POA: Diagnosis not present

## 2019-07-14 ENCOUNTER — Telehealth: Payer: Self-pay | Admitting: *Deleted

## 2019-07-14 DIAGNOSIS — R918 Other nonspecific abnormal finding of lung field: Secondary | ICD-10-CM

## 2019-07-14 DIAGNOSIS — Z87891 Personal history of nicotine dependence: Secondary | ICD-10-CM

## 2019-07-14 NOTE — Telephone Encounter (Signed)
Patient was notified that his Low Dose Lung Cancer screening CT is due after 07/18/19. He is currently smoking 1/3 pack of cigarettes per day for the last year. He has had no major health events this past year. He is not undergoing evaluation or treatment of cancer at this time.His Covid injs were in [place] Feb. No Insurance changes. He has [employer].Delwyn said to avoid Thursday appts and he will be on vacation at [facility] the week of July 4th.

## 2019-07-17 NOTE — Telephone Encounter (Signed)
Smoking history: current, 46.25 pack year

## 2019-07-17 NOTE — Addendum Note (Signed)
Addended by: Lieutenant Diego on: 07/17/2019 04:54 PM   Modules accepted: Orders

## 2019-07-20 DIAGNOSIS — J449 Chronic obstructive pulmonary disease, unspecified: Secondary | ICD-10-CM | POA: Diagnosis not present

## 2019-07-20 DIAGNOSIS — F17200 Nicotine dependence, unspecified, uncomplicated: Secondary | ICD-10-CM | POA: Diagnosis not present

## 2019-07-20 DIAGNOSIS — I359 Nonrheumatic aortic valve disorder, unspecified: Secondary | ICD-10-CM | POA: Diagnosis not present

## 2019-07-20 DIAGNOSIS — E786 Lipoprotein deficiency: Secondary | ICD-10-CM | POA: Diagnosis not present

## 2019-07-31 DIAGNOSIS — Z0000 Encounter for general adult medical examination without abnormal findings: Secondary | ICD-10-CM | POA: Diagnosis not present

## 2019-07-31 DIAGNOSIS — R5381 Other malaise: Secondary | ICD-10-CM | POA: Diagnosis not present

## 2019-07-31 DIAGNOSIS — Z954 Presence of other heart-valve replacement: Secondary | ICD-10-CM | POA: Diagnosis not present

## 2019-07-31 DIAGNOSIS — F17200 Nicotine dependence, unspecified, uncomplicated: Secondary | ICD-10-CM | POA: Diagnosis not present

## 2019-07-31 DIAGNOSIS — R3912 Poor urinary stream: Secondary | ICD-10-CM | POA: Diagnosis not present

## 2019-07-31 DIAGNOSIS — E786 Lipoprotein deficiency: Secondary | ICD-10-CM | POA: Diagnosis not present

## 2019-07-31 DIAGNOSIS — R5383 Other fatigue: Secondary | ICD-10-CM | POA: Diagnosis not present

## 2019-07-31 DIAGNOSIS — N401 Enlarged prostate with lower urinary tract symptoms: Secondary | ICD-10-CM | POA: Diagnosis not present

## 2019-07-31 DIAGNOSIS — J302 Other seasonal allergic rhinitis: Secondary | ICD-10-CM | POA: Diagnosis not present

## 2019-07-31 DIAGNOSIS — I359 Nonrheumatic aortic valve disorder, unspecified: Secondary | ICD-10-CM | POA: Diagnosis not present

## 2019-08-02 ENCOUNTER — Ambulatory Visit
Admission: RE | Admit: 2019-08-02 | Discharge: 2019-08-02 | Disposition: A | Discharge status: Home / Self Care | Payer: PPO | Source: Ambulatory Visit | Attending: Oncology | Admitting: Oncology

## 2019-08-02 ENCOUNTER — Other Ambulatory Visit: Payer: Self-pay

## 2019-08-02 DIAGNOSIS — I700 Atherosclerosis of aorta: Secondary | ICD-10-CM | POA: Insufficient documentation

## 2019-08-02 DIAGNOSIS — Z87891 Personal history of nicotine dependence: Secondary | ICD-10-CM

## 2019-08-02 DIAGNOSIS — I2510 Atherosclerotic heart disease of native coronary artery without angina pectoris: Secondary | ICD-10-CM | POA: Insufficient documentation

## 2019-08-02 DIAGNOSIS — J432 Centrilobular emphysema: Secondary | ICD-10-CM | POA: Diagnosis not present

## 2019-08-02 DIAGNOSIS — F17210 Nicotine dependence, cigarettes, uncomplicated: Secondary | ICD-10-CM | POA: Diagnosis not present

## 2019-08-02 DIAGNOSIS — R918 Other nonspecific abnormal finding of lung field: Secondary | ICD-10-CM | POA: Diagnosis not present

## 2019-08-02 DIAGNOSIS — Z122 Encounter for screening for malignant neoplasm of respiratory organs: Secondary | ICD-10-CM | POA: Insufficient documentation

## 2019-08-02 DIAGNOSIS — J9809 Other diseases of bronchus, not elsewhere classified: Secondary | ICD-10-CM | POA: Diagnosis not present

## 2019-08-07 NOTE — Progress Notes (Signed)
Daniel Larsson, NP 08/07/2019 8:57 AM

## 2019-08-08 ENCOUNTER — Telehealth: Payer: Self-pay | Admitting: *Deleted

## 2019-08-08 NOTE — Telephone Encounter (Signed)
After review of patient's imaging with pulmonologist, including slow growing lung nodule, emphysematous lungs, and size and location of nodule. Patient is notified of LDCT lung cancer screening program results with recommendation for 6 month follow up imaging. Also notified of incidental findings noted below and is encouraged to discuss further with PCP who will receive a copy of this note and/or the CT report. Patient verbalizes understanding.   IMPRESSION: 1. Lung-RADS 4BS, suspicious. Additional imaging evaluation or consultation with Pulmonology or Thoracic Surgery recommended. 2. The "S" modifier above refers to potentially clinically significant non lung cancer related findings. Specifically, there is aortic atherosclerosis, in addition to 2 vessel coronary artery disease. Assessment for potential risk factor modification, dietary therapy or pharmacologic therapy may be warranted, if clinically indicated. 3. Mild diffuse bronchial wall thickening with moderate centrilobular and paraseptal emphysema; imaging findings suggestive of underlying COPD.  These results will be called to the ordering clinician or

## 2019-08-10 DIAGNOSIS — H25813 Combined forms of age-related cataract, bilateral: Secondary | ICD-10-CM | POA: Diagnosis not present

## 2019-08-21 DIAGNOSIS — F17200 Nicotine dependence, unspecified, uncomplicated: Secondary | ICD-10-CM | POA: Diagnosis not present

## 2019-08-21 DIAGNOSIS — Z954 Presence of other heart-valve replacement: Secondary | ICD-10-CM | POA: Diagnosis not present

## 2019-08-21 DIAGNOSIS — R911 Solitary pulmonary nodule: Secondary | ICD-10-CM | POA: Diagnosis not present

## 2019-08-21 DIAGNOSIS — R05 Cough: Secondary | ICD-10-CM | POA: Diagnosis not present

## 2019-08-21 DIAGNOSIS — I359 Nonrheumatic aortic valve disorder, unspecified: Secondary | ICD-10-CM | POA: Diagnosis not present

## 2019-08-22 ENCOUNTER — Other Ambulatory Visit: Payer: Self-pay | Admitting: Internal Medicine

## 2019-08-22 DIAGNOSIS — R911 Solitary pulmonary nodule: Secondary | ICD-10-CM

## 2019-08-30 ENCOUNTER — Other Ambulatory Visit: Payer: Self-pay

## 2019-08-30 ENCOUNTER — Encounter
Admission: RE | Admit: 2019-08-30 | Discharge: 2019-08-30 | Disposition: A | Discharge status: Home / Self Care | Payer: PPO | Source: Ambulatory Visit | Attending: Internal Medicine | Admitting: Internal Medicine

## 2019-08-30 DIAGNOSIS — J439 Emphysema, unspecified: Secondary | ICD-10-CM | POA: Insufficient documentation

## 2019-08-30 DIAGNOSIS — R911 Solitary pulmonary nodule: Secondary | ICD-10-CM | POA: Diagnosis not present

## 2019-08-30 DIAGNOSIS — I2510 Atherosclerotic heart disease of native coronary artery without angina pectoris: Secondary | ICD-10-CM | POA: Insufficient documentation

## 2019-08-30 DIAGNOSIS — J432 Centrilobular emphysema: Secondary | ICD-10-CM | POA: Diagnosis not present

## 2019-08-30 DIAGNOSIS — I700 Atherosclerosis of aorta: Secondary | ICD-10-CM | POA: Insufficient documentation

## 2019-08-30 DIAGNOSIS — J8410 Pulmonary fibrosis, unspecified: Secondary | ICD-10-CM | POA: Diagnosis not present

## 2019-08-30 LAB — GLUCOSE, CAPILLARY: Glucose-Capillary: 91 mg/dL (ref 70–99)

## 2019-08-30 MED ORDER — FLUDEOXYGLUCOSE F - 18 (FDG) INJECTION
8.6000 | Freq: Once | INTRAVENOUS | Status: AC | PRN
  Administered 2019-08-30: 9.21 via INTRAVENOUS

## 2019-10-30 ENCOUNTER — Ambulatory Visit: Payer: PPO | Attending: Internal Medicine

## 2019-10-30 DIAGNOSIS — Z23 Encounter for immunization: Secondary | ICD-10-CM

## 2019-10-30 NOTE — Progress Notes (Signed)
   [id number]  Name:  Daniel BREIT Sr.    MRN: [id number] DOB: [birth year]  10/30/2019  Mr. Shelnutt was observed post Covid-19 immunization for 15 minutes without incident. He was provided with Vaccine Information Sheet and instruction to access the V-Safe system.   Mr. Maalouf was instructed to call 911 with any severe reactions post vaccine: Marland Kitchen Difficulty breathing  . Swelling of face and throat  . A fast heartbeat  . A bad rash all over body  . Dizziness and weakness

## 2019-11-15 ENCOUNTER — Other Ambulatory Visit: Payer: Self-pay | Admitting: Internal Medicine

## 2019-11-15 ENCOUNTER — Other Ambulatory Visit (HOSPITAL_COMMUNITY): Payer: Self-pay | Admitting: Internal Medicine

## 2019-11-15 DIAGNOSIS — I359 Nonrheumatic aortic valve disorder, unspecified: Secondary | ICD-10-CM

## 2019-11-15 DIAGNOSIS — Z954 Presence of other heart-valve replacement: Secondary | ICD-10-CM

## 2019-11-15 DIAGNOSIS — F17200 Nicotine dependence, unspecified, uncomplicated: Secondary | ICD-10-CM

## 2019-11-15 DIAGNOSIS — R911 Solitary pulmonary nodule: Secondary | ICD-10-CM

## 2019-12-29 DIAGNOSIS — Z20822 Contact with and (suspected) exposure to covid-19: Secondary | ICD-10-CM | POA: Diagnosis not present

## 2020-01-08 DIAGNOSIS — I359 Nonrheumatic aortic valve disorder, unspecified: Secondary | ICD-10-CM | POA: Diagnosis not present

## 2020-01-08 DIAGNOSIS — F17200 Nicotine dependence, unspecified, uncomplicated: Secondary | ICD-10-CM | POA: Diagnosis not present

## 2020-01-08 DIAGNOSIS — Z9889 Other specified postprocedural states: Secondary | ICD-10-CM | POA: Diagnosis not present

## 2020-01-08 DIAGNOSIS — Z954 Presence of other heart-valve replacement: Secondary | ICD-10-CM | POA: Diagnosis not present

## 2020-01-08 DIAGNOSIS — R5383 Other fatigue: Secondary | ICD-10-CM | POA: Diagnosis not present

## 2020-01-16 ENCOUNTER — Ambulatory Visit
Admission: RE | Admit: 2020-01-16 | Discharge: 2020-01-16 | Disposition: A | Discharge status: Home / Self Care | Payer: PPO | Source: Ambulatory Visit | Attending: Internal Medicine | Admitting: Internal Medicine

## 2020-01-16 ENCOUNTER — Other Ambulatory Visit: Payer: Self-pay

## 2020-01-16 DIAGNOSIS — Z954 Presence of other heart-valve replacement: Secondary | ICD-10-CM | POA: Diagnosis not present

## 2020-01-16 DIAGNOSIS — R911 Solitary pulmonary nodule: Secondary | ICD-10-CM | POA: Diagnosis not present

## 2020-01-16 DIAGNOSIS — I359 Nonrheumatic aortic valve disorder, unspecified: Secondary | ICD-10-CM | POA: Insufficient documentation

## 2020-01-16 DIAGNOSIS — N281 Cyst of kidney, acquired: Secondary | ICD-10-CM | POA: Diagnosis not present

## 2020-01-16 DIAGNOSIS — F17200 Nicotine dependence, unspecified, uncomplicated: Secondary | ICD-10-CM | POA: Diagnosis not present

## 2020-01-16 DIAGNOSIS — J984 Other disorders of lung: Secondary | ICD-10-CM | POA: Diagnosis not present

## 2020-01-16 DIAGNOSIS — J439 Emphysema, unspecified: Secondary | ICD-10-CM | POA: Diagnosis not present

## 2020-01-16 LAB — GLUCOSE, CAPILLARY: Glucose-Capillary: 101 mg/dL — ABNORMAL HIGH (ref 70–99)

## 2020-01-16 MED ORDER — FLUDEOXYGLUCOSE F - 18 (FDG) INJECTION
8.6000 | Freq: Once | INTRAVENOUS | Status: AC | PRN
  Administered 2020-01-16: 09:00:00 8.61 via INTRAVENOUS

## 2020-01-23 DIAGNOSIS — M2550 Pain in unspecified joint: Secondary | ICD-10-CM | POA: Diagnosis not present

## 2020-01-23 DIAGNOSIS — R911 Solitary pulmonary nodule: Secondary | ICD-10-CM | POA: Diagnosis not present

## 2020-02-06 DIAGNOSIS — R42 Dizziness and giddiness: Secondary | ICD-10-CM | POA: Diagnosis not present

## 2020-02-06 DIAGNOSIS — Z9889 Other specified postprocedural states: Secondary | ICD-10-CM | POA: Diagnosis not present

## 2020-02-06 DIAGNOSIS — R5382 Chronic fatigue, unspecified: Secondary | ICD-10-CM | POA: Diagnosis not present

## 2020-02-06 DIAGNOSIS — Z954 Presence of other heart-valve replacement: Secondary | ICD-10-CM | POA: Diagnosis not present

## 2020-02-06 DIAGNOSIS — R911 Solitary pulmonary nodule: Secondary | ICD-10-CM | POA: Diagnosis not present

## 2020-02-06 DIAGNOSIS — F17200 Nicotine dependence, unspecified, uncomplicated: Secondary | ICD-10-CM | POA: Diagnosis not present

## 2020-02-06 DIAGNOSIS — R5381 Other malaise: Secondary | ICD-10-CM | POA: Diagnosis not present

## 2020-05-17 DIAGNOSIS — M2550 Pain in unspecified joint: Secondary | ICD-10-CM | POA: Diagnosis not present

## 2020-05-17 DIAGNOSIS — R053 Chronic cough: Secondary | ICD-10-CM | POA: Diagnosis not present

## 2020-05-17 DIAGNOSIS — R5381 Other malaise: Secondary | ICD-10-CM | POA: Diagnosis not present

## 2020-05-17 DIAGNOSIS — Z0000 Encounter for general adult medical examination without abnormal findings: Secondary | ICD-10-CM | POA: Diagnosis not present

## 2020-05-17 DIAGNOSIS — I359 Nonrheumatic aortic valve disorder, unspecified: Secondary | ICD-10-CM | POA: Diagnosis not present

## 2020-05-17 DIAGNOSIS — R42 Dizziness and giddiness: Secondary | ICD-10-CM | POA: Diagnosis not present

## 2020-05-17 DIAGNOSIS — R5383 Other fatigue: Secondary | ICD-10-CM | POA: Diagnosis not present

## 2020-05-17 DIAGNOSIS — E786 Lipoprotein deficiency: Secondary | ICD-10-CM | POA: Diagnosis not present

## 2020-05-24 DIAGNOSIS — R911 Solitary pulmonary nodule: Secondary | ICD-10-CM | POA: Diagnosis not present

## 2020-05-24 DIAGNOSIS — F17210 Nicotine dependence, cigarettes, uncomplicated: Secondary | ICD-10-CM | POA: Diagnosis not present

## 2020-05-24 DIAGNOSIS — E786 Lipoprotein deficiency: Secondary | ICD-10-CM | POA: Diagnosis not present

## 2020-05-24 DIAGNOSIS — N529 Male erectile dysfunction, unspecified: Secondary | ICD-10-CM | POA: Diagnosis not present

## 2020-05-24 DIAGNOSIS — R3912 Poor urinary stream: Secondary | ICD-10-CM | POA: Diagnosis not present

## 2020-05-24 DIAGNOSIS — N401 Enlarged prostate with lower urinary tract symptoms: Secondary | ICD-10-CM | POA: Diagnosis not present

## 2020-05-24 DIAGNOSIS — Z0000 Encounter for general adult medical examination without abnormal findings: Secondary | ICD-10-CM | POA: Diagnosis not present

## 2020-06-07 ENCOUNTER — Other Ambulatory Visit (HOSPITAL_COMMUNITY): Payer: Self-pay | Admitting: Internal Medicine

## 2020-06-07 ENCOUNTER — Other Ambulatory Visit: Payer: Self-pay | Admitting: Internal Medicine

## 2020-06-07 DIAGNOSIS — R911 Solitary pulmonary nodule: Secondary | ICD-10-CM

## 2020-07-19 ENCOUNTER — Ambulatory Visit
Admission: RE | Admit: 2020-07-19 | Discharge: 2020-07-19 | Disposition: A | Discharge status: Home / Self Care | Payer: PPO | Source: Ambulatory Visit | Attending: Internal Medicine | Admitting: Internal Medicine

## 2020-07-19 ENCOUNTER — Other Ambulatory Visit: Payer: Self-pay

## 2020-07-19 DIAGNOSIS — J439 Emphysema, unspecified: Secondary | ICD-10-CM | POA: Diagnosis not present

## 2020-07-19 DIAGNOSIS — I700 Atherosclerosis of aorta: Secondary | ICD-10-CM | POA: Diagnosis not present

## 2020-07-19 DIAGNOSIS — R911 Solitary pulmonary nodule: Secondary | ICD-10-CM | POA: Insufficient documentation

## 2020-07-19 LAB — POCT I-STAT CREATININE: Creatinine, Ser: 1.3 mg/dL — ABNORMAL HIGH (ref 0.61–1.24)

## 2020-07-19 MED ORDER — IOHEXOL 300 MG/ML  SOLN
75.0000 mL | Freq: Once | INTRAMUSCULAR | Status: AC | PRN
  Administered 2020-07-19: 75 mL via INTRAVENOUS

## 2020-07-25 ENCOUNTER — Other Ambulatory Visit (HOSPITAL_COMMUNITY): Payer: Self-pay | Admitting: Internal Medicine

## 2020-07-25 ENCOUNTER — Other Ambulatory Visit: Payer: Self-pay | Admitting: Internal Medicine

## 2020-07-25 DIAGNOSIS — R911 Solitary pulmonary nodule: Secondary | ICD-10-CM

## 2020-08-20 DIAGNOSIS — H25813 Combined forms of age-related cataract, bilateral: Secondary | ICD-10-CM | POA: Diagnosis not present

## 2020-11-19 DIAGNOSIS — Z0000 Encounter for general adult medical examination without abnormal findings: Secondary | ICD-10-CM | POA: Diagnosis not present

## 2020-11-19 DIAGNOSIS — N529 Male erectile dysfunction, unspecified: Secondary | ICD-10-CM | POA: Diagnosis not present

## 2020-11-19 DIAGNOSIS — E786 Lipoprotein deficiency: Secondary | ICD-10-CM | POA: Diagnosis not present

## 2020-11-19 DIAGNOSIS — N401 Enlarged prostate with lower urinary tract symptoms: Secondary | ICD-10-CM | POA: Diagnosis not present

## 2020-11-19 DIAGNOSIS — R3912 Poor urinary stream: Secondary | ICD-10-CM | POA: Diagnosis not present

## 2020-11-19 DIAGNOSIS — Z125 Encounter for screening for malignant neoplasm of prostate: Secondary | ICD-10-CM | POA: Diagnosis not present

## 2020-11-19 DIAGNOSIS — R911 Solitary pulmonary nodule: Secondary | ICD-10-CM | POA: Diagnosis not present

## 2020-11-19 DIAGNOSIS — F17200 Nicotine dependence, unspecified, uncomplicated: Secondary | ICD-10-CM | POA: Diagnosis not present

## 2020-11-26 DIAGNOSIS — N529 Male erectile dysfunction, unspecified: Secondary | ICD-10-CM | POA: Diagnosis not present

## 2020-11-26 DIAGNOSIS — R3912 Poor urinary stream: Secondary | ICD-10-CM | POA: Diagnosis not present

## 2020-11-26 DIAGNOSIS — R0981 Nasal congestion: Secondary | ICD-10-CM | POA: Diagnosis not present

## 2020-11-26 DIAGNOSIS — F17200 Nicotine dependence, unspecified, uncomplicated: Secondary | ICD-10-CM | POA: Diagnosis not present

## 2020-11-26 DIAGNOSIS — R911 Solitary pulmonary nodule: Secondary | ICD-10-CM | POA: Diagnosis not present

## 2020-11-26 DIAGNOSIS — N401 Enlarged prostate with lower urinary tract symptoms: Secondary | ICD-10-CM | POA: Diagnosis not present

## 2020-11-26 DIAGNOSIS — Z954 Presence of other heart-valve replacement: Secondary | ICD-10-CM | POA: Diagnosis not present

## 2020-11-28 IMAGING — CT NM PET TUM IMG INITIAL (PI) SKULL BASE T - THIGH
1 of 9 series · 1 of 25 positions shown · non-contrast
Comparison: CT chest 08/02/2018

CLINICAL DATA: Initial treatment strategy for lung nodule.

EXAM:
NUCLEAR MEDICINE PET SKULL BASE TO THIGH
TECHNIQUE: 9.21 mCi F-18 FDG was injected intravenously. Full-ring PET imaging
was performed from the skull base to thigh after the radiotracer. CT
data was obtained and used for attenuation correction and anatomic
localization.
Fasting blood glucose: 91 mg/dl

[Series 3: ct wb 5.0 b30f · axial · 5.0mm · 0.98mm/px · 1 of 329 slices shown]
[im 329/329  brain]
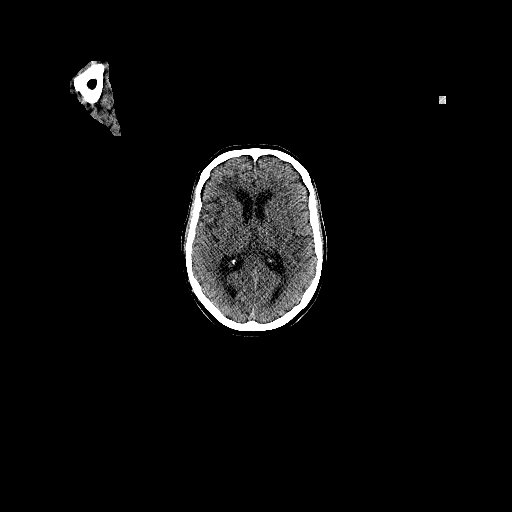

[1 of 25 positions shown; findings below may reference images not displayed]

FINDINGS: Mediastinal blood pool activity: SUV max

Liver activity: SUV max NA

NECK: No hypermetabolic lymph nodes in the neck.

Incidental CT findings: none

CHEST: The 8 mm left upper lobe spiculated nodule is again noted,
image 79/3. The corresponding SUV max is equal to 1.03. Calcified
granuloma is again noted within the posteromedial right upper lobe,
image 79/3. no FDG avid mediastinal or hilar lymph nodes.

Incidental CT findings: Centrilobular and paraseptal emphysema.
Aortic atherosclerosis. Previous median sternotomy and aortic valve
replacement.

ABDOMEN/PELVIS: Bilateral kidney cysts are noted. Hyperdense,
partially exophytic lesion arising from posterolateral aspect of the
left kidney appears similar to previous exam. These are all
incompletely characterized without IV contrast material aortic
atherosclerosis without evidence for aneurysm.

Incidental CT findings: none

SKELETON: No focal hypermetabolic activity to suggest skeletal
metastasis.

Incidental CT findings: none
IMPRESSION: 1. No significant FDG uptake associated with the recently
characterized, suspicious, 8 mm nodule in the left upper lobe. The
small size of this nodule isjust below the resolution of PET-CT and
therefore the absence of significant FDG uptake does not exclude the
possibility of underlying malignancy.
2. No FDG avid mediastinal or hilar lymph nodes or signs of distant
metastatic disease.
3. Coronary artery calcifications
4. Aortic Atherosclerosis (L09C5-UC4.4) and Emphysema (L09C5-T1S.Z).

## 2021-01-23 ENCOUNTER — Ambulatory Visit: Payer: PPO | Admitting: Dermatology

## 2021-01-23 ENCOUNTER — Other Ambulatory Visit: Payer: Self-pay

## 2021-01-23 DIAGNOSIS — L2089 Other atopic dermatitis: Secondary | ICD-10-CM | POA: Diagnosis not present

## 2021-01-23 DIAGNOSIS — B353 Tinea pedis: Secondary | ICD-10-CM

## 2021-01-23 MED ORDER — MOMETASONE FUROATE 0.1 % EX CREA: 1.0000 "application " | TOPICAL_CREAM | Freq: Every day | CUTANEOUS | 0 refills | Status: AC | PRN

## 2021-01-23 MED ORDER — KETOCONAZOLE 2 % EX CREA: 1.0000 "application " | TOPICAL_CREAM | Freq: Two times a day (BID) | CUTANEOUS | 2 refills | Status: AC

## 2021-01-23 NOTE — Progress Notes (Signed)
° °  New Patient Visit  Subjective  Daniel DOELL Sr. is a 81 y.o. male who presents for the following: Skin Problem (Patient with a dark red spot at left foot, present for 4-5 days. No itching, no pain, some tenderness with pressure. He is not aware of any injury. ).  The following portions of the chart were reviewed this encounter and updated as appropriate:   Tobacco   Allergies   Meds   Problems   Med Hx   Surg Hx   Fam Hx      Review of Systems:  No other skin or systemic complaints except as noted in HPI or Assessment and Plan.  Objective  Well appearing patient in no apparent distress; mood and affect are within normal limits.  A focused examination was performed including lower extremities, including the legs, feet, toes, and toenails. Relevant physical exam findings are noted in the Assessment and Plan.  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  ketoconazole (NIZORAL) 2 % cream - Left Foot - Anterior Apply 1 application topically 2 (two) times daily. Apply to feet, in between toes nightly.  Atopic dermatitis Left Foot - Anterior Start mometasone cream in the morning to rash Monday through Friday. Avoid applying to face, groin, and axilla. Use as directed. Long-term use can cause thinning of the skin.  Topical steroids (such as triamcinolone, fluocinolone, fluocinonide, mometasone, clobetasol, halobetasol, betamethasone, hydrocortisone) can cause thinning and lightening of the skin if they are used for too long in the same area. Your physician has selected the right strength medicine for your problem and area affected on the body. Please use your medication only as directed by your physician to prevent side effects.  mometasone (ELOCON) 0.1 %  cream - Left Foot - Anterior Apply 1 application topically daily as needed (Rash). Apply to affected area of rash in the morning Monday through Friday until clear. Avoid applying to face, groin, and axilla. Use as directed. Long-term use can cause thinning of the skin.  Return for 6-8 weeks .  Graciella Belton, RMA, am acting as scribe for Sarina Ser, MD . Documentation: I have reviewed the above documentation for accuracy and completeness, and I agree with the above.  Sarina Ser, MD

## 2021-01-23 NOTE — Patient Instructions (Addendum)
Start ketoconazole 2% cream at bedtime to both feet, in between toes.  Start mometasone cream in the morning to rash Monday through Friday. Avoid applying to face, groin, and axilla. Use as directed. Long-term use can cause thinning of the skin.  Topical steroids (such as triamcinolone, fluocinolone, fluocinonide, mometasone, clobetasol, halobetasol, betamethasone, hydrocortisone) can cause thinning and lightening of the skin if they are used for too long in the same area. Your physician has selected the right strength medicine for your problem and area affected on the body. Please use your medication only as directed by your physician to prevent side effects.   If You Need Anything After Your Visit  If you have any questions or concerns for your doctor, please call our main line at [phone] and press option 4 to reach your doctor's medical assistant. If no one answers, please leave a voicemail as directed and we will return your call as soon as possible. Messages left after 4 pm will be answered the following business day.   You may also send [place] a message via [place]. We typically respond to MyChart messages within 1-2 business days.  For prescription refills, please ask your pharmacy to contact our office. Our fax number is [phone].  If you have an urgent issue when the clinic is closed that cannot wait until the next business day, you can page your doctor at the number below.    Please note that while we do our best to be available for urgent issues outside of office hours, we are not available 24/7.   If you have an urgent issue and are unable to reach [place], you may choose to seek medical care at your doctor's office, retail clinic, urgent care center, or emergency room.  If you have a medical emergency, please immediately call 911 or go to the emergency department.  Pager Numbers  - Dr. Nehemiah Massed: [phone]  - Dr. Laurence Ferrari: [phone]  - Dr. Nicole Kindred: [phone]  In the  event of inclement weather, please call our main line at [phone] for an update on the status of any delays or closures.  Dermatology Medication Tips: Please keep the boxes that topical medications come in in order to help keep track of the instructions about where and how to use these. Pharmacies typically print the medication instructions only on the boxes and not directly on the medication tubes.   If your medication is too expensive, please contact our office at [phone] option 4 or send [place] a message through [place].   We are unable to tell what your co-pay for medications will be in advance as this is different depending on your insurance coverage. However, we may be able to find a substitute medication at lower cost or fill out paperwork to get insurance to cover a needed medication.   If a prior authorization is required to get your medication covered by your insurance company, please allow [place] 1-2 business days to complete this process.  Drug prices often vary depending on where the prescription is filled and some pharmacies may offer cheaper prices.  The website www.goodrx.com contains coupons for medications through different pharmacies. The prices here do not account for what the cost may be with help from insurance (it may be cheaper with your insurance), but the website can give you the price if you did not use any insurance.  - You can print the associated coupon and take it with your prescription to the pharmacy.  - You may also stop by our  office during regular business hours and pick up a GoodRx coupon card.  - If you need your prescription sent electronically to a different pharmacy, notify our office through [facility] or by phone at [phone] option 4.     Si Usted Necesita Algo Despus de Su Visita  Tambin puede enviarnos un mensaje a travs de Pharmacist, community. Por lo general respondemos a los mensajes de MyChart en el transcurso de 1 a 2 das hbiles.  Para  renovar recetas, por favor pida a su farmacia que se ponga en contacto con nuestra oficina. Harland Dingwall de fax es [place] [phone].  Si tiene un asunto urgente cuando la clnica est cerrada y que no puede esperar hasta el siguiente da hbil, puede llamar/localizar a su doctor(a) al nmero que aparece a continuacin.   Por favor, tenga en cuenta que aunque hacemos todo lo posible para estar disponibles para asuntos urgentes fuera del horario de [place], no estamos disponibles las 24 horas del da, los 7 das de la [place].   Si tiene un problema urgente y no puede comunicarse con nosotros, puede optar por buscar atencin mdica  en el consultorio de su doctor(a), en una clnica privada, en un centro de atencin urgente o en una sala de emergencias.  Si tiene Engineering geologist, por favor llame inmediatamente al 911 o vaya a la sala de emergencias.  Nmeros de bper  - Dr. Nehemiah Massed: [phone]  - Dra. Moye: [phone]  - Dra. Nicole Kindred: [phone]  En caso de inclemencias del [place], por favor llame a Johnsie Kindred principal al [phone] para una actualizacin sobre el [place] de cualquier retraso o cierre.  Consejos para la medicacin en dermatologa: Por favor, guarde las cajas en las que vienen los medicamentos de uso tpico para ayudarle a seguir las instrucciones sobre dnde y cmo usarlos. Las farmacias generalmente imprimen las instrucciones del medicamento slo en las cajas y no directamente en los tubos del [place].   Si su medicamento es muy caro, por favor, pngase en contacto con Zigmund Daniel llamando al [phone] y presione la opcin 4 o envenos un mensaje a travs de Pharmacist, community.   No podemos decirle cul ser su copago por los medicamentos por adelantado ya que esto es diferente dependiendo de la cobertura de su seguro. Sin embargo, es posible que podamos encontrar un medicamento sustituto a Electrical engineer un formulario para que el seguro cubra el  medicamento que se considera necesario.   Si se requiere una autorizacin previa para que su compaa de seguros [place] su medicamento, por favor permtanos de 1 a 2 das hbiles para completar este proceso.  Los precios de los medicamentos varan con frecuencia dependiendo del Environmental consultant de dnde se surte la receta y alguna farmacias pueden ofrecer precios ms baratos.  El sitio web www.goodrx.com tiene cupones para medicamentos de Airline pilot. Los precios aqu no tienen en cuenta lo que podra costar con la ayuda del seguro (puede ser ms barato con su seguro), pero el sitio web puede darle el precio si no utiliz Research scientist (physical sciences).  - Puede imprimir el cupn correspondiente y llevarlo con su receta a la farmacia.  - Tambin puede pasar por nuestra oficina durante el horario de atencin regular y Charity fundraiser una tarjeta de cupones de GoodRx.  - Si necesita que su receta se enve electrnicamente a una farmacia diferente, informe a nuestra oficina a travs de MyChart de [place] o por telfono llamando al [phone] y presione la opcin 4.

## 2021-01-28 ENCOUNTER — Encounter: Payer: Self-pay | Admitting: Dermatology

## 2021-01-28 ENCOUNTER — Ambulatory Visit
Admission: RE | Admit: 2021-01-28 | Discharge: 2021-01-28 | Disposition: A | Discharge status: Home / Self Care | Payer: PPO | Source: Ambulatory Visit | Attending: Internal Medicine | Admitting: Internal Medicine

## 2021-01-28 ENCOUNTER — Other Ambulatory Visit: Payer: Self-pay

## 2021-01-28 DIAGNOSIS — R911 Solitary pulmonary nodule: Secondary | ICD-10-CM | POA: Diagnosis not present

## 2021-01-28 DIAGNOSIS — J439 Emphysema, unspecified: Secondary | ICD-10-CM | POA: Diagnosis not present

## 2021-01-28 LAB — POCT I-STAT CREATININE: Creatinine, Ser: 1.3 mg/dL — ABNORMAL HIGH (ref 0.61–1.24)

## 2021-01-28 MED ORDER — IOHEXOL 300 MG/ML  SOLN
60.0000 mL | Freq: Once | INTRAMUSCULAR | Status: AC | PRN
  Administered 2021-01-28: 60 mL via INTRAVENOUS

## 2021-03-20 ENCOUNTER — Other Ambulatory Visit: Payer: Self-pay

## 2021-03-20 ENCOUNTER — Ambulatory Visit: Payer: PPO | Admitting: Dermatology

## 2021-03-20 DIAGNOSIS — L309 Dermatitis, unspecified: Secondary | ICD-10-CM | POA: Diagnosis not present

## 2021-03-20 DIAGNOSIS — B353 Tinea pedis: Secondary | ICD-10-CM | POA: Diagnosis not present

## 2021-03-20 NOTE — Progress Notes (Signed)
? ?  Follow-Up Visit ?  ?Subjective  ?Daniel Sims Sr. is a 81 y.o. male who presents for the following: Follow-up (8 weeks f/u tinea pedis on the left foot treating with Ketoconazole cream and Mometasone cream with a good response ). ?The following portions of the chart were reviewed this encounter and updated as appropriate:  ? Tobacco  Allergies  Meds  Problems  Med Hx  Surg Hx  Fam Hx   ?  ?Review of Systems:  No other skin or systemic complaints except as noted in HPI or Assessment and Plan. ? ?Objective  ?Well appearing patient in no apparent distress; mood and affect are within normal limits. ? ?A focused examination was performed including left foot. Relevant physical exam findings are noted in the Assessment and Plan. ? ?left foot ?Mainly clear  ? ?left foot ?Scaling and maceration web spaces and over distal and lateral soles.  ? ? ?Assessment & Plan  ?Dermatitis ?left foot ?Chronic- improved from Mometasone cream  ?D/C Mometasone cream  ? ?Tinea pedis of left foot ?left foot ?Chronic and persistent condition with duration or expected duration over one year. Condition is symptomatic/ bothersome to patient. Not currently at goal.  ? ?Cont Ketoconazole 2% cream apply to left foot at bedtime x 2 weeks then stop and observe. ? ?Return if symptoms worsen or fail to improve. ? ?I, Marye Round, CMA, am acting as scribe for Sarina Ser, MD .  ?Documentation: I have reviewed the above documentation for accuracy and completeness, and I agree with the above. ? ?Sarina Ser, MD ? ?

## 2021-03-20 NOTE — Patient Instructions (Addendum)
Continue Ketoconazole 2 % cream for 2 more weeks then stop  ? ? ? ?If You Need Anything After Your Visit ? ?If you have any questions or concerns for your doctor, please call our main line at [phone] and press option 4 to reach your doctor's medical assistant. If no one answers, please leave a voicemail as directed and we will return your call as soon as possible. Messages left after 4 pm will be answered the following business day.  ? ?You may also send [place] a message via MyChart. We typically respond to MyChart messages within 1-2 business days. ? ?For prescription refills, please ask your pharmacy to contact our office. Our fax number is [phone]. ? ?If you have an urgent issue when the clinic is closed that cannot wait until the next business day, you can page your doctor at the number below.   ? ?Please note that while we do our best to be available for urgent issues outside of office hours, we are not available 24/7.  ? ?If you have an urgent issue and are unable to reach [place], you may choose to seek medical care at your doctor's office, retail clinic, urgent care center, or emergency room. ? ?If you have a medical emergency, please immediately call 911 or go to the emergency department. ? ?Pager Numbers ? ?- Dr. Nehemiah Massed: [phone] ? ?- Dr. Laurence Ferrari: [phone] ? ?- Dr. Nicole Kindred: [phone] ? ?In the event of inclement weather, please call our main line at [phone] for an update on the status of any delays or closures. ? ?Dermatology Medication Tips: ?Please keep the boxes that topical medications come in in order to help keep track of the instructions about where and how to use these. Pharmacies typically print the medication instructions only on the boxes and not directly on the medication tubes.  ? ?If your medication is too expensive, please contact our office at [phone] option 4 or send [place] a message through [facility] de Gutierrez.  ? ?We are unable to tell what your co-pay for medications will be in  advance as this is different depending on your insurance coverage. However, we may be able to find a substitute medication at lower cost or fill out paperwork to get insurance to cover a needed medication.  ? ?If a prior authorization is required to get your medication covered by your insurance company, please allow [place] 1-2 business days to complete this process. ? ?Drug prices often vary depending on where the prescription is filled and some pharmacies may offer cheaper prices. ? ?The website www.goodrx.com contains coupons for medications through different pharmacies. The prices here do not account for what the cost may be with help from insurance (it may be cheaper with your insurance), but the website can give you the price if you did not use any insurance.  ?- You can print the associated coupon and take it with your prescription to the pharmacy.  ?- You may also stop by our office during regular business hours and pick up a GoodRx coupon card.  ?- If you need your prescription sent electronically to a different pharmacy, notify our office through [facility] or by phone at [phone] option 4. ? ? ? ? ?Si Usted Necesita Algo Despu?s de Su Visita ? ?Tambi?n puede enviarnos un mensaje a trav?s de MyChart. Por lo general respondemos a los mensajes de MyChart en el transcurso de 1 a 2 d?as h?biles. ? ?Para renovar recetas, por favor pida a su farmacia que se ponga  en contacto con nuestra oficina. Nuestro n?mero de fax es el [phone]. ? ?Si tiene un asunto urgente cuando la cl?nica est? cerrada y que no puede esperar hasta el siguiente d?a h?bil, puede llamar/localizar a su doctor(a) al n?mero que aparece a continuaci?n.  ? ?Por favor, tenga en cuenta que aunque hacemos todo lo posible para estar disponibles para asuntos urgentes fuera del horario de oficina, no estamos disponibles las 24 horas del d?a, los 7 d?as de la semana.  ? ?Si tiene un problema urgente y no puede comunicarse con nosotros, puede  optar por buscar atenci?n m?dica  en el consultorio de su doctor(a), en una cl?nica privada, en un centro de atenci?n urgente o en una sala de emergencias. ? ?Si tiene Engineer, maintenance (IT) m?dica, por favor llame inmediatamente al 911 o vaya a la sala de emergencias. ? ?N?meros de b?per ? ?- Dr. Nehemiah Massed: [phone] ? ?- Dra. Moye: [phone] ? ?- Dra. Nicole Kindred: [phone] ? ?En caso de inclemencias del tiempo, por favor llame a nuestra l?nea principal al [phone] para una actualizaci?n sobre el estado de cualquier retraso o cierre. ? ?Consejos para la medicaci?n en dermatolog?a: ?Por favor, guarde las cajas en las que vienen los medicamentos de uso t?pico para ayudarle a seguir las instrucciones sobre d?nde y c?mo usarlos. Las farmacias generalmente imprimen las instrucciones del medicamento s?lo en las cajas y no directamente en los tubos del [place].  ? ?Si su medicamento es muy caro, por favor, p?ngase en contacto con Zigmund Daniel llamando al [phone] y presione la opci?n 4 o env?enos un mensaje a trav?s de MyChart.  ? ?No podemos decirle cu?l ser? su copago por los medicamentos por adelantado ya que esto es diferente dependiendo de la cobertura de su seguro. Sin embargo, es posible que podamos encontrar un medicamento sustituto a Electrical engineer un formulario para que el seguro cubra el medicamento que se considera necesario.  ? ?Si se requiere Ardelia Mems autorizaci?n previa para que su compa??a de seguros [place] su medicamento, por favor perm?tanos de 1 a 2 d?as h?biles para completar este proceso. ? ?Los precios de los medicamentos var?an con frecuencia dependiendo del Environmental consultant de d?nde se surte la receta y alguna farmacias pueden ofrecer precios m?s baratos. ? ?El sitio web www.goodrx.com tiene cupones para medicamentos de Airline pilot. Los precios aqu? no tienen en cuenta lo que podr?a costar con la ayuda del seguro (puede ser m?s barato con su seguro), pero el sitio web puede darle el  precio si no utiliz? ning?n seguro.  ?- Puede imprimir el cup?n correspondiente y llevarlo con su receta a la farmacia.  ?- Tambi?n puede pasar por nuestra oficina durante el horario de atenci?n regular y recoger una tarjeta de cupones de GoodRx.  ?- Si necesita que su receta se env?e electr?nicamente a Chiropodist, informe a nuestra oficina a trav?s de MyChart de [place] o por tel?fono llamando al [phone] y presione la opci?n 4.  ?

## 2021-03-22 ENCOUNTER — Encounter: Payer: Self-pay | Admitting: Dermatology

## 2021-03-26 DIAGNOSIS — I359 Nonrheumatic aortic valve disorder, unspecified: Secondary | ICD-10-CM | POA: Diagnosis not present

## 2021-03-26 DIAGNOSIS — Z954 Presence of other heart-valve replacement: Secondary | ICD-10-CM | POA: Diagnosis not present

## 2021-03-26 DIAGNOSIS — F17200 Nicotine dependence, unspecified, uncomplicated: Secondary | ICD-10-CM | POA: Diagnosis not present

## 2021-04-04 DIAGNOSIS — Z954 Presence of other heart-valve replacement: Secondary | ICD-10-CM | POA: Diagnosis not present

## 2021-04-04 DIAGNOSIS — I359 Nonrheumatic aortic valve disorder, unspecified: Secondary | ICD-10-CM | POA: Diagnosis not present

## 2021-05-23 DIAGNOSIS — N401 Enlarged prostate with lower urinary tract symptoms: Secondary | ICD-10-CM | POA: Diagnosis not present

## 2021-05-23 DIAGNOSIS — Z954 Presence of other heart-valve replacement: Secondary | ICD-10-CM | POA: Diagnosis not present

## 2021-05-23 DIAGNOSIS — R0981 Nasal congestion: Secondary | ICD-10-CM | POA: Diagnosis not present

## 2021-05-23 DIAGNOSIS — N529 Male erectile dysfunction, unspecified: Secondary | ICD-10-CM | POA: Diagnosis not present

## 2021-05-23 DIAGNOSIS — R911 Solitary pulmonary nodule: Secondary | ICD-10-CM | POA: Diagnosis not present

## 2021-05-23 DIAGNOSIS — F17200 Nicotine dependence, unspecified, uncomplicated: Secondary | ICD-10-CM | POA: Diagnosis not present

## 2021-05-23 DIAGNOSIS — R3912 Poor urinary stream: Secondary | ICD-10-CM | POA: Diagnosis not present

## 2021-05-23 DIAGNOSIS — E786 Lipoprotein deficiency: Secondary | ICD-10-CM | POA: Diagnosis not present

## 2021-05-30 DIAGNOSIS — Z0000 Encounter for general adult medical examination without abnormal findings: Secondary | ICD-10-CM | POA: Diagnosis not present

## 2021-05-30 DIAGNOSIS — J302 Other seasonal allergic rhinitis: Secondary | ICD-10-CM | POA: Diagnosis not present

## 2021-05-30 DIAGNOSIS — R3912 Poor urinary stream: Secondary | ICD-10-CM | POA: Diagnosis not present

## 2021-05-30 DIAGNOSIS — E786 Lipoprotein deficiency: Secondary | ICD-10-CM | POA: Diagnosis not present

## 2021-05-30 DIAGNOSIS — N401 Enlarged prostate with lower urinary tract symptoms: Secondary | ICD-10-CM | POA: Diagnosis not present

## 2021-05-30 DIAGNOSIS — F17210 Nicotine dependence, cigarettes, uncomplicated: Secondary | ICD-10-CM | POA: Diagnosis not present

## 2021-05-30 DIAGNOSIS — I359 Nonrheumatic aortic valve disorder, unspecified: Secondary | ICD-10-CM | POA: Diagnosis not present

## 2021-08-12 DIAGNOSIS — H25813 Combined forms of age-related cataract, bilateral: Secondary | ICD-10-CM | POA: Diagnosis not present

## 2021-11-25 DIAGNOSIS — Z0000 Encounter for general adult medical examination without abnormal findings: Secondary | ICD-10-CM | POA: Diagnosis not present

## 2021-11-25 DIAGNOSIS — F17200 Nicotine dependence, unspecified, uncomplicated: Secondary | ICD-10-CM | POA: Diagnosis not present

## 2021-11-25 DIAGNOSIS — I359 Nonrheumatic aortic valve disorder, unspecified: Secondary | ICD-10-CM | POA: Diagnosis not present

## 2021-11-25 DIAGNOSIS — N401 Enlarged prostate with lower urinary tract symptoms: Secondary | ICD-10-CM | POA: Diagnosis not present

## 2021-11-25 DIAGNOSIS — R3912 Poor urinary stream: Secondary | ICD-10-CM | POA: Diagnosis not present

## 2021-11-25 DIAGNOSIS — Z125 Encounter for screening for malignant neoplasm of prostate: Secondary | ICD-10-CM | POA: Diagnosis not present

## 2021-11-25 DIAGNOSIS — J302 Other seasonal allergic rhinitis: Secondary | ICD-10-CM | POA: Diagnosis not present

## 2021-11-25 DIAGNOSIS — E786 Lipoprotein deficiency: Secondary | ICD-10-CM | POA: Diagnosis not present

## 2021-12-02 DIAGNOSIS — Z954 Presence of other heart-valve replacement: Secondary | ICD-10-CM | POA: Diagnosis not present

## 2021-12-02 DIAGNOSIS — N401 Enlarged prostate with lower urinary tract symptoms: Secondary | ICD-10-CM | POA: Diagnosis not present

## 2021-12-02 DIAGNOSIS — I359 Nonrheumatic aortic valve disorder, unspecified: Secondary | ICD-10-CM | POA: Diagnosis not present

## 2021-12-02 DIAGNOSIS — R195 Other fecal abnormalities: Secondary | ICD-10-CM | POA: Diagnosis not present

## 2021-12-02 DIAGNOSIS — R3912 Poor urinary stream: Secondary | ICD-10-CM | POA: Diagnosis not present

## 2021-12-02 DIAGNOSIS — I700 Atherosclerosis of aorta: Secondary | ICD-10-CM | POA: Diagnosis not present

## 2021-12-02 DIAGNOSIS — F17200 Nicotine dependence, unspecified, uncomplicated: Secondary | ICD-10-CM | POA: Diagnosis not present

## 2021-12-02 DIAGNOSIS — Z2821 Immunization not carried out because of patient refusal: Secondary | ICD-10-CM | POA: Diagnosis not present

## 2021-12-02 DIAGNOSIS — Z8616 Personal history of COVID-19: Secondary | ICD-10-CM | POA: Diagnosis not present

## 2021-12-02 DIAGNOSIS — E786 Lipoprotein deficiency: Secondary | ICD-10-CM | POA: Diagnosis not present

## 2022-01-26 DIAGNOSIS — D72829 Elevated white blood cell count, unspecified: Secondary | ICD-10-CM | POA: Diagnosis not present

## 2022-01-26 DIAGNOSIS — M25552 Pain in left hip: Secondary | ICD-10-CM | POA: Diagnosis not present

## 2022-01-26 DIAGNOSIS — M25551 Pain in right hip: Secondary | ICD-10-CM | POA: Diagnosis not present

## 2022-01-26 DIAGNOSIS — M791 Myalgia, unspecified site: Secondary | ICD-10-CM | POA: Diagnosis not present

## 2022-01-26 DIAGNOSIS — M160 Bilateral primary osteoarthritis of hip: Secondary | ICD-10-CM | POA: Diagnosis not present

## 2022-02-12 DIAGNOSIS — Z951 Presence of aortocoronary bypass graft: Secondary | ICD-10-CM | POA: Diagnosis not present

## 2022-02-12 DIAGNOSIS — M5412 Radiculopathy, cervical region: Secondary | ICD-10-CM | POA: Diagnosis not present

## 2022-02-12 DIAGNOSIS — J984 Other disorders of lung: Secondary | ICD-10-CM | POA: Diagnosis not present

## 2022-02-12 DIAGNOSIS — J40 Bronchitis, not specified as acute or chronic: Secondary | ICD-10-CM | POA: Diagnosis not present

## 2022-02-12 DIAGNOSIS — M25511 Pain in right shoulder: Secondary | ICD-10-CM | POA: Diagnosis not present

## 2022-02-12 DIAGNOSIS — M353 Polymyalgia rheumatica: Secondary | ICD-10-CM | POA: Diagnosis not present

## 2022-02-16 DIAGNOSIS — S46011A Strain of muscle(s) and tendon(s) of the rotator cuff of right shoulder, initial encounter: Secondary | ICD-10-CM | POA: Diagnosis not present

## 2022-02-16 DIAGNOSIS — I700 Atherosclerosis of aorta: Secondary | ICD-10-CM | POA: Diagnosis not present

## 2022-02-17 ENCOUNTER — Other Ambulatory Visit: Payer: Self-pay | Admitting: Orthopedic Surgery

## 2022-02-17 DIAGNOSIS — S46011A Strain of muscle(s) and tendon(s) of the rotator cuff of right shoulder, initial encounter: Secondary | ICD-10-CM

## 2022-02-21 ENCOUNTER — Ambulatory Visit
Admission: RE | Admit: 2022-02-21 | Discharge: 2022-02-21 | Disposition: A | Discharge status: Home / Self Care | Payer: PPO | Source: Ambulatory Visit | Attending: Orthopedic Surgery | Admitting: Orthopedic Surgery

## 2022-02-21 DIAGNOSIS — S46011A Strain of muscle(s) and tendon(s) of the rotator cuff of right shoulder, initial encounter: Secondary | ICD-10-CM | POA: Insufficient documentation

## 2022-02-21 DIAGNOSIS — M25511 Pain in right shoulder: Secondary | ICD-10-CM | POA: Diagnosis not present

## 2022-03-03 DIAGNOSIS — R531 Weakness: Secondary | ICD-10-CM | POA: Diagnosis not present

## 2022-03-03 DIAGNOSIS — F4321 Adjustment disorder with depressed mood: Secondary | ICD-10-CM | POA: Diagnosis not present

## 2022-03-03 DIAGNOSIS — I359 Nonrheumatic aortic valve disorder, unspecified: Secondary | ICD-10-CM | POA: Diagnosis not present

## 2022-03-03 DIAGNOSIS — M797 Fibromyalgia: Secondary | ICD-10-CM | POA: Diagnosis not present

## 2022-03-03 DIAGNOSIS — M67911 Unspecified disorder of synovium and tendon, right shoulder: Secondary | ICD-10-CM | POA: Diagnosis not present

## 2022-03-03 DIAGNOSIS — R5383 Other fatigue: Secondary | ICD-10-CM | POA: Diagnosis not present

## 2022-03-03 DIAGNOSIS — R5381 Other malaise: Secondary | ICD-10-CM | POA: Diagnosis not present

## 2022-03-03 DIAGNOSIS — M2550 Pain in unspecified joint: Secondary | ICD-10-CM | POA: Diagnosis not present

## 2022-03-03 DIAGNOSIS — R911 Solitary pulmonary nodule: Secondary | ICD-10-CM | POA: Diagnosis not present

## 2022-03-03 DIAGNOSIS — F17210 Nicotine dependence, cigarettes, uncomplicated: Secondary | ICD-10-CM | POA: Diagnosis not present

## 2022-03-03 DIAGNOSIS — R630 Anorexia: Secondary | ICD-10-CM | POA: Diagnosis not present

## 2022-03-25 DIAGNOSIS — M7581 Other shoulder lesions, right shoulder: Secondary | ICD-10-CM | POA: Diagnosis not present

## 2022-03-26 DIAGNOSIS — I700 Atherosclerosis of aorta: Secondary | ICD-10-CM | POA: Diagnosis not present

## 2022-03-26 DIAGNOSIS — R9431 Abnormal electrocardiogram [ECG] [EKG]: Secondary | ICD-10-CM | POA: Diagnosis not present

## 2022-03-26 DIAGNOSIS — Z952 Presence of prosthetic heart valve: Secondary | ICD-10-CM | POA: Diagnosis not present

## 2022-03-26 DIAGNOSIS — Z7982 Long term (current) use of aspirin: Secondary | ICD-10-CM | POA: Diagnosis not present

## 2022-03-26 DIAGNOSIS — I359 Nonrheumatic aortic valve disorder, unspecified: Secondary | ICD-10-CM | POA: Diagnosis not present

## 2022-03-26 DIAGNOSIS — I6523 Occlusion and stenosis of bilateral carotid arteries: Secondary | ICD-10-CM | POA: Diagnosis not present

## 2022-03-26 DIAGNOSIS — Z720 Tobacco use: Secondary | ICD-10-CM | POA: Diagnosis not present

## 2022-03-31 DIAGNOSIS — M7581 Other shoulder lesions, right shoulder: Secondary | ICD-10-CM | POA: Diagnosis not present

## 2022-04-02 DIAGNOSIS — M7581 Other shoulder lesions, right shoulder: Secondary | ICD-10-CM | POA: Diagnosis not present

## 2022-04-06 DIAGNOSIS — M7581 Other shoulder lesions, right shoulder: Secondary | ICD-10-CM | POA: Diagnosis not present

## 2022-04-08 DIAGNOSIS — M7581 Other shoulder lesions, right shoulder: Secondary | ICD-10-CM | POA: Diagnosis not present

## 2022-04-14 DIAGNOSIS — M7581 Other shoulder lesions, right shoulder: Secondary | ICD-10-CM | POA: Diagnosis not present

## 2022-04-16 DIAGNOSIS — M7581 Other shoulder lesions, right shoulder: Secondary | ICD-10-CM | POA: Diagnosis not present

## 2022-04-22 DIAGNOSIS — M7581 Other shoulder lesions, right shoulder: Secondary | ICD-10-CM | POA: Diagnosis not present

## 2022-04-24 DIAGNOSIS — M7581 Other shoulder lesions, right shoulder: Secondary | ICD-10-CM | POA: Diagnosis not present

## 2022-04-28 DIAGNOSIS — M7581 Other shoulder lesions, right shoulder: Secondary | ICD-10-CM | POA: Diagnosis not present

## 2022-05-01 DIAGNOSIS — M7581 Other shoulder lesions, right shoulder: Secondary | ICD-10-CM | POA: Diagnosis not present

## 2022-05-05 DIAGNOSIS — M7581 Other shoulder lesions, right shoulder: Secondary | ICD-10-CM | POA: Diagnosis not present

## 2022-05-08 DIAGNOSIS — M7581 Other shoulder lesions, right shoulder: Secondary | ICD-10-CM | POA: Diagnosis not present

## 2022-06-01 DIAGNOSIS — E786 Lipoprotein deficiency: Secondary | ICD-10-CM | POA: Diagnosis not present

## 2022-06-01 DIAGNOSIS — R195 Other fecal abnormalities: Secondary | ICD-10-CM | POA: Diagnosis not present

## 2022-06-01 DIAGNOSIS — Z954 Presence of other heart-valve replacement: Secondary | ICD-10-CM | POA: Diagnosis not present

## 2022-06-01 DIAGNOSIS — R3912 Poor urinary stream: Secondary | ICD-10-CM | POA: Diagnosis not present

## 2022-06-01 DIAGNOSIS — F17200 Nicotine dependence, unspecified, uncomplicated: Secondary | ICD-10-CM | POA: Diagnosis not present

## 2022-06-01 DIAGNOSIS — I359 Nonrheumatic aortic valve disorder, unspecified: Secondary | ICD-10-CM | POA: Diagnosis not present

## 2022-06-01 DIAGNOSIS — N401 Enlarged prostate with lower urinary tract symptoms: Secondary | ICD-10-CM | POA: Diagnosis not present

## 2022-06-08 DIAGNOSIS — F17210 Nicotine dependence, cigarettes, uncomplicated: Secondary | ICD-10-CM | POA: Diagnosis not present

## 2022-06-08 DIAGNOSIS — M25511 Pain in right shoulder: Secondary | ICD-10-CM | POA: Diagnosis not present

## 2022-06-08 DIAGNOSIS — E786 Lipoprotein deficiency: Secondary | ICD-10-CM | POA: Diagnosis not present

## 2022-06-08 DIAGNOSIS — R911 Solitary pulmonary nodule: Secondary | ICD-10-CM | POA: Diagnosis not present

## 2022-06-08 DIAGNOSIS — M25512 Pain in left shoulder: Secondary | ICD-10-CM | POA: Diagnosis not present

## 2022-06-08 DIAGNOSIS — Z0000 Encounter for general adult medical examination without abnormal findings: Secondary | ICD-10-CM | POA: Diagnosis not present

## 2022-06-08 DIAGNOSIS — Z954 Presence of other heart-valve replacement: Secondary | ICD-10-CM | POA: Diagnosis not present

## 2022-06-09 ENCOUNTER — Encounter: Payer: Self-pay | Admitting: Internal Medicine

## 2022-06-11 ENCOUNTER — Other Ambulatory Visit: Payer: Self-pay | Admitting: Internal Medicine

## 2022-06-11 DIAGNOSIS — R911 Solitary pulmonary nodule: Secondary | ICD-10-CM

## 2022-06-11 DIAGNOSIS — Z720 Tobacco use: Secondary | ICD-10-CM

## 2022-06-11 DIAGNOSIS — F17200 Nicotine dependence, unspecified, uncomplicated: Secondary | ICD-10-CM

## 2022-06-11 DIAGNOSIS — R630 Anorexia: Secondary | ICD-10-CM

## 2022-06-16 ENCOUNTER — Ambulatory Visit
Admission: RE | Admit: 2022-06-16 | Discharge: 2022-06-16 | Disposition: A | Discharge status: Home / Self Care | Payer: PPO | Source: Ambulatory Visit | Attending: Internal Medicine | Admitting: Internal Medicine

## 2022-06-16 DIAGNOSIS — R630 Anorexia: Secondary | ICD-10-CM | POA: Diagnosis not present

## 2022-06-16 DIAGNOSIS — R911 Solitary pulmonary nodule: Secondary | ICD-10-CM

## 2022-06-16 DIAGNOSIS — Z720 Tobacco use: Secondary | ICD-10-CM

## 2022-06-16 DIAGNOSIS — F17200 Nicotine dependence, unspecified, uncomplicated: Secondary | ICD-10-CM

## 2022-06-16 DIAGNOSIS — J439 Emphysema, unspecified: Secondary | ICD-10-CM | POA: Diagnosis not present

## 2022-06-16 DIAGNOSIS — R918 Other nonspecific abnormal finding of lung field: Secondary | ICD-10-CM | POA: Diagnosis not present

## 2022-06-16 MED ORDER — IOHEXOL 300 MG/ML  SOLN
75.0000 mL | Freq: Once | INTRAMUSCULAR | Status: AC | PRN
  Administered 2022-06-16: 75 mL via INTRAVENOUS

## 2022-07-07 DIAGNOSIS — R197 Diarrhea, unspecified: Secondary | ICD-10-CM | POA: Diagnosis not present

## 2022-07-15 DIAGNOSIS — R058 Other specified cough: Secondary | ICD-10-CM | POA: Diagnosis not present

## 2022-07-15 DIAGNOSIS — R059 Cough, unspecified: Secondary | ICD-10-CM | POA: Diagnosis not present

## 2022-07-15 DIAGNOSIS — R197 Diarrhea, unspecified: Secondary | ICD-10-CM | POA: Diagnosis not present

## 2022-07-15 DIAGNOSIS — R509 Fever, unspecified: Secondary | ICD-10-CM | POA: Diagnosis not present

## 2022-07-15 DIAGNOSIS — U071 COVID-19: Secondary | ICD-10-CM | POA: Diagnosis not present

## 2022-08-13 DIAGNOSIS — H25813 Combined forms of age-related cataract, bilateral: Secondary | ICD-10-CM | POA: Diagnosis not present

## 2022-10-12 DIAGNOSIS — M25511 Pain in right shoulder: Secondary | ICD-10-CM | POA: Diagnosis not present

## 2022-10-12 DIAGNOSIS — F17200 Nicotine dependence, unspecified, uncomplicated: Secondary | ICD-10-CM | POA: Diagnosis not present

## 2022-10-12 DIAGNOSIS — Z954 Presence of other heart-valve replacement: Secondary | ICD-10-CM | POA: Diagnosis not present

## 2022-10-12 DIAGNOSIS — Z0000 Encounter for general adult medical examination without abnormal findings: Secondary | ICD-10-CM | POA: Diagnosis not present

## 2022-10-12 DIAGNOSIS — R911 Solitary pulmonary nodule: Secondary | ICD-10-CM | POA: Diagnosis not present

## 2022-10-12 DIAGNOSIS — E786 Lipoprotein deficiency: Secondary | ICD-10-CM | POA: Diagnosis not present

## 2022-10-12 DIAGNOSIS — M25512 Pain in left shoulder: Secondary | ICD-10-CM | POA: Diagnosis not present

## 2022-10-19 DIAGNOSIS — I359 Nonrheumatic aortic valve disorder, unspecified: Secondary | ICD-10-CM | POA: Diagnosis not present

## 2022-10-19 DIAGNOSIS — R531 Weakness: Secondary | ICD-10-CM | POA: Diagnosis not present

## 2022-10-19 DIAGNOSIS — R911 Solitary pulmonary nodule: Secondary | ICD-10-CM | POA: Diagnosis not present

## 2022-10-19 DIAGNOSIS — R634 Abnormal weight loss: Secondary | ICD-10-CM | POA: Diagnosis not present

## 2022-10-19 DIAGNOSIS — Z720 Tobacco use: Secondary | ICD-10-CM | POA: Diagnosis not present

## 2022-10-19 DIAGNOSIS — Z954 Presence of other heart-valve replacement: Secondary | ICD-10-CM | POA: Diagnosis not present

## 2022-10-19 DIAGNOSIS — Z2821 Immunization not carried out because of patient refusal: Secondary | ICD-10-CM | POA: Diagnosis not present

## 2022-10-19 DIAGNOSIS — E876 Hypokalemia: Secondary | ICD-10-CM | POA: Diagnosis not present

## 2022-10-19 DIAGNOSIS — A0472 Enterocolitis due to Clostridium difficile, not specified as recurrent: Secondary | ICD-10-CM | POA: Diagnosis not present

## 2022-10-19 DIAGNOSIS — N1831 Chronic kidney disease, stage 3a: Secondary | ICD-10-CM | POA: Diagnosis not present

## 2022-10-26 DIAGNOSIS — E876 Hypokalemia: Secondary | ICD-10-CM | POA: Diagnosis not present

## 2022-10-26 DIAGNOSIS — R531 Weakness: Secondary | ICD-10-CM | POA: Diagnosis not present

## 2022-11-09 DIAGNOSIS — S62615A Displaced fracture of proximal phalanx of left ring finger, initial encounter for closed fracture: Secondary | ICD-10-CM | POA: Diagnosis not present

## 2022-11-19 DIAGNOSIS — S62615A Displaced fracture of proximal phalanx of left ring finger, initial encounter for closed fracture: Secondary | ICD-10-CM | POA: Diagnosis not present

## 2022-12-10 DIAGNOSIS — S62615A Displaced fracture of proximal phalanx of left ring finger, initial encounter for closed fracture: Secondary | ICD-10-CM | POA: Diagnosis not present

## 2023-01-26 DIAGNOSIS — R531 Weakness: Secondary | ICD-10-CM | POA: Diagnosis not present

## 2023-01-26 DIAGNOSIS — R197 Diarrhea, unspecified: Secondary | ICD-10-CM | POA: Diagnosis not present

## 2023-01-26 DIAGNOSIS — J329 Chronic sinusitis, unspecified: Secondary | ICD-10-CM | POA: Diagnosis not present

## 2023-01-26 DIAGNOSIS — R634 Abnormal weight loss: Secondary | ICD-10-CM | POA: Diagnosis not present

## 2023-01-26 DIAGNOSIS — E785 Hyperlipidemia, unspecified: Secondary | ICD-10-CM | POA: Diagnosis not present

## 2023-01-26 DIAGNOSIS — Z03818 Encounter for observation for suspected exposure to other biological agents ruled out: Secondary | ICD-10-CM | POA: Diagnosis not present

## 2023-01-26 DIAGNOSIS — J984 Other disorders of lung: Secondary | ICD-10-CM | POA: Diagnosis not present

## 2023-01-27 DIAGNOSIS — R634 Abnormal weight loss: Secondary | ICD-10-CM | POA: Diagnosis not present

## 2023-01-27 DIAGNOSIS — R197 Diarrhea, unspecified: Secondary | ICD-10-CM | POA: Diagnosis not present

## 2023-02-11 DIAGNOSIS — Z954 Presence of other heart-valve replacement: Secondary | ICD-10-CM | POA: Diagnosis not present

## 2023-02-11 DIAGNOSIS — I359 Nonrheumatic aortic valve disorder, unspecified: Secondary | ICD-10-CM | POA: Diagnosis not present

## 2023-02-11 DIAGNOSIS — R634 Abnormal weight loss: Secondary | ICD-10-CM | POA: Diagnosis not present

## 2023-02-11 DIAGNOSIS — A0472 Enterocolitis due to Clostridium difficile, not specified as recurrent: Secondary | ICD-10-CM | POA: Diagnosis not present

## 2023-02-11 DIAGNOSIS — R911 Solitary pulmonary nodule: Secondary | ICD-10-CM | POA: Diagnosis not present

## 2023-02-11 DIAGNOSIS — Z720 Tobacco use: Secondary | ICD-10-CM | POA: Diagnosis not present

## 2023-02-11 DIAGNOSIS — E876 Hypokalemia: Secondary | ICD-10-CM | POA: Diagnosis not present

## 2023-02-12 DIAGNOSIS — R8290 Unspecified abnormal findings in urine: Secondary | ICD-10-CM | POA: Diagnosis not present

## 2023-02-19 DIAGNOSIS — Z0000 Encounter for general adult medical examination without abnormal findings: Secondary | ICD-10-CM | POA: Diagnosis not present

## 2023-02-19 DIAGNOSIS — R5383 Other fatigue: Secondary | ICD-10-CM | POA: Diagnosis not present

## 2023-02-19 DIAGNOSIS — I700 Atherosclerosis of aorta: Secondary | ICD-10-CM | POA: Diagnosis not present

## 2023-02-19 DIAGNOSIS — J438 Other emphysema: Secondary | ICD-10-CM | POA: Diagnosis not present

## 2023-02-19 DIAGNOSIS — J0181 Other acute recurrent sinusitis: Secondary | ICD-10-CM | POA: Diagnosis not present

## 2023-02-19 DIAGNOSIS — A0472 Enterocolitis due to Clostridium difficile, not specified as recurrent: Secondary | ICD-10-CM | POA: Diagnosis not present

## 2023-02-19 DIAGNOSIS — F17210 Nicotine dependence, cigarettes, uncomplicated: Secondary | ICD-10-CM | POA: Diagnosis not present

## 2023-02-19 DIAGNOSIS — R918 Other nonspecific abnormal finding of lung field: Secondary | ICD-10-CM | POA: Diagnosis not present

## 2023-02-19 DIAGNOSIS — N1831 Chronic kidney disease, stage 3a: Secondary | ICD-10-CM | POA: Diagnosis not present

## 2023-02-19 DIAGNOSIS — N400 Enlarged prostate without lower urinary tract symptoms: Secondary | ICD-10-CM | POA: Diagnosis not present

## 2023-02-19 DIAGNOSIS — R634 Abnormal weight loss: Secondary | ICD-10-CM | POA: Diagnosis not present

## 2023-02-24 DIAGNOSIS — A0472 Enterocolitis due to Clostridium difficile, not specified as recurrent: Secondary | ICD-10-CM | POA: Diagnosis not present

## 2023-02-24 DIAGNOSIS — R634 Abnormal weight loss: Secondary | ICD-10-CM | POA: Diagnosis not present

## 2023-03-29 DIAGNOSIS — Z954 Presence of other heart-valve replacement: Secondary | ICD-10-CM | POA: Diagnosis not present

## 2023-03-29 DIAGNOSIS — Z7982 Long term (current) use of aspirin: Secondary | ICD-10-CM | POA: Diagnosis not present

## 2023-03-29 DIAGNOSIS — I6523 Occlusion and stenosis of bilateral carotid arteries: Secondary | ICD-10-CM | POA: Diagnosis not present

## 2023-03-29 DIAGNOSIS — R9431 Abnormal electrocardiogram [ECG] [EKG]: Secondary | ICD-10-CM | POA: Diagnosis not present

## 2023-03-29 DIAGNOSIS — Z952 Presence of prosthetic heart valve: Secondary | ICD-10-CM | POA: Diagnosis not present

## 2023-03-29 DIAGNOSIS — I359 Nonrheumatic aortic valve disorder, unspecified: Secondary | ICD-10-CM | POA: Diagnosis not present

## 2023-03-29 DIAGNOSIS — Z720 Tobacco use: Secondary | ICD-10-CM | POA: Diagnosis not present

## 2023-03-29 DIAGNOSIS — I700 Atherosclerosis of aorta: Secondary | ICD-10-CM | POA: Diagnosis not present

## 2023-04-14 DIAGNOSIS — I700 Atherosclerosis of aorta: Secondary | ICD-10-CM | POA: Diagnosis not present

## 2023-04-14 DIAGNOSIS — Z950 Presence of cardiac pacemaker: Secondary | ICD-10-CM | POA: Diagnosis not present

## 2023-06-02 DIAGNOSIS — F17200 Nicotine dependence, unspecified, uncomplicated: Secondary | ICD-10-CM | POA: Diagnosis not present

## 2023-06-02 DIAGNOSIS — N1831 Chronic kidney disease, stage 3a: Secondary | ICD-10-CM | POA: Diagnosis not present

## 2023-06-02 DIAGNOSIS — R8290 Unspecified abnormal findings in urine: Secondary | ICD-10-CM | POA: Diagnosis not present

## 2023-06-02 DIAGNOSIS — A0472 Enterocolitis due to Clostridium difficile, not specified as recurrent: Secondary | ICD-10-CM | POA: Diagnosis not present

## 2023-06-02 DIAGNOSIS — R5383 Other fatigue: Secondary | ICD-10-CM | POA: Diagnosis not present

## 2023-06-07 ENCOUNTER — Other Ambulatory Visit: Payer: Self-pay | Admitting: Internal Medicine

## 2023-06-07 DIAGNOSIS — F17200 Nicotine dependence, unspecified, uncomplicated: Secondary | ICD-10-CM

## 2023-06-07 DIAGNOSIS — R911 Solitary pulmonary nodule: Secondary | ICD-10-CM

## 2023-06-07 DIAGNOSIS — R630 Anorexia: Secondary | ICD-10-CM

## 2023-06-09 DIAGNOSIS — Z1331 Encounter for screening for depression: Secondary | ICD-10-CM | POA: Diagnosis not present

## 2023-06-09 DIAGNOSIS — Z954 Presence of other heart-valve replacement: Secondary | ICD-10-CM | POA: Diagnosis not present

## 2023-06-09 DIAGNOSIS — N390 Urinary tract infection, site not specified: Secondary | ICD-10-CM | POA: Diagnosis not present

## 2023-06-09 DIAGNOSIS — F17210 Nicotine dependence, cigarettes, uncomplicated: Secondary | ICD-10-CM | POA: Diagnosis not present

## 2023-06-09 DIAGNOSIS — B952 Enterococcus as the cause of diseases classified elsewhere: Secondary | ICD-10-CM | POA: Diagnosis not present

## 2023-06-09 DIAGNOSIS — Z0000 Encounter for general adult medical examination without abnormal findings: Secondary | ICD-10-CM | POA: Diagnosis not present

## 2023-06-09 DIAGNOSIS — R195 Other fecal abnormalities: Secondary | ICD-10-CM | POA: Diagnosis not present

## 2023-06-09 DIAGNOSIS — I700 Atherosclerosis of aorta: Secondary | ICD-10-CM | POA: Diagnosis not present

## 2023-06-22 DIAGNOSIS — R634 Abnormal weight loss: Secondary | ICD-10-CM | POA: Diagnosis not present

## 2023-06-22 DIAGNOSIS — Z954 Presence of other heart-valve replacement: Secondary | ICD-10-CM | POA: Diagnosis not present

## 2023-06-22 DIAGNOSIS — Z8619 Personal history of other infectious and parasitic diseases: Secondary | ICD-10-CM | POA: Diagnosis not present

## 2023-06-22 DIAGNOSIS — K529 Noninfective gastroenteritis and colitis, unspecified: Secondary | ICD-10-CM | POA: Diagnosis not present

## 2023-06-22 DIAGNOSIS — F17200 Nicotine dependence, unspecified, uncomplicated: Secondary | ICD-10-CM | POA: Diagnosis not present

## 2023-06-22 DIAGNOSIS — I359 Nonrheumatic aortic valve disorder, unspecified: Secondary | ICD-10-CM | POA: Diagnosis not present

## 2023-06-23 DIAGNOSIS — R634 Abnormal weight loss: Secondary | ICD-10-CM | POA: Diagnosis not present

## 2023-06-23 DIAGNOSIS — K529 Noninfective gastroenteritis and colitis, unspecified: Secondary | ICD-10-CM | POA: Diagnosis not present

## 2023-06-24 ENCOUNTER — Ambulatory Visit
Admission: RE | Admit: 2023-06-24 | Discharge: 2023-06-24 | Disposition: A | Discharge status: Home / Self Care | Source: Ambulatory Visit | Attending: Internal Medicine | Admitting: Internal Medicine

## 2023-06-24 DIAGNOSIS — N6323 Unspecified lump in the left breast, lower outer quadrant: Secondary | ICD-10-CM | POA: Diagnosis not present

## 2023-06-24 DIAGNOSIS — J439 Emphysema, unspecified: Secondary | ICD-10-CM | POA: Diagnosis not present

## 2023-06-24 DIAGNOSIS — F17200 Nicotine dependence, unspecified, uncomplicated: Secondary | ICD-10-CM | POA: Insufficient documentation

## 2023-06-24 DIAGNOSIS — R918 Other nonspecific abnormal finding of lung field: Secondary | ICD-10-CM | POA: Diagnosis not present

## 2023-06-24 DIAGNOSIS — R911 Solitary pulmonary nodule: Secondary | ICD-10-CM | POA: Insufficient documentation

## 2023-06-24 DIAGNOSIS — R630 Anorexia: Secondary | ICD-10-CM | POA: Insufficient documentation

## 2023-07-08 DIAGNOSIS — K591 Functional diarrhea: Secondary | ICD-10-CM | POA: Diagnosis not present

## 2023-07-08 DIAGNOSIS — R634 Abnormal weight loss: Secondary | ICD-10-CM | POA: Diagnosis not present

## 2023-07-08 DIAGNOSIS — F17200 Nicotine dependence, unspecified, uncomplicated: Secondary | ICD-10-CM | POA: Diagnosis not present

## 2023-07-08 DIAGNOSIS — J438 Other emphysema: Secondary | ICD-10-CM | POA: Diagnosis not present

## 2023-07-08 DIAGNOSIS — K529 Noninfective gastroenteritis and colitis, unspecified: Secondary | ICD-10-CM | POA: Diagnosis not present

## 2023-07-08 DIAGNOSIS — Z954 Presence of other heart-valve replacement: Secondary | ICD-10-CM | POA: Diagnosis not present

## 2023-07-28 ENCOUNTER — Encounter
Admission: RE | Disposition: A | Discharge status: Home / Self Care | Payer: Self-pay | Source: Home / Self Care | Attending: Internal Medicine

## 2023-07-28 ENCOUNTER — Other Ambulatory Visit: Payer: Self-pay

## 2023-07-28 ENCOUNTER — Encounter: Payer: Self-pay | Admitting: Internal Medicine

## 2023-07-28 ENCOUNTER — Ambulatory Visit

## 2023-07-28 ENCOUNTER — Ambulatory Visit
Admission: RE | Admit: 2023-07-28 | Discharge: 2023-07-28 | Disposition: A | Discharge status: Home / Self Care | Attending: Internal Medicine | Admitting: Internal Medicine

## 2023-07-28 DIAGNOSIS — F17210 Nicotine dependence, cigarettes, uncomplicated: Secondary | ICD-10-CM | POA: Diagnosis not present

## 2023-07-28 DIAGNOSIS — K591 Functional diarrhea: Secondary | ICD-10-CM | POA: Insufficient documentation

## 2023-07-28 DIAGNOSIS — K449 Diaphragmatic hernia without obstruction or gangrene: Secondary | ICD-10-CM | POA: Insufficient documentation

## 2023-07-28 DIAGNOSIS — J4489 Other specified chronic obstructive pulmonary disease: Secondary | ICD-10-CM | POA: Diagnosis not present

## 2023-07-28 DIAGNOSIS — K5730 Diverticulosis of large intestine without perforation or abscess without bleeding: Secondary | ICD-10-CM | POA: Diagnosis not present

## 2023-07-28 DIAGNOSIS — Z681 Body mass index (BMI) 19 or less, adult: Secondary | ICD-10-CM | POA: Insufficient documentation

## 2023-07-28 DIAGNOSIS — R634 Abnormal weight loss: Secondary | ICD-10-CM | POA: Diagnosis not present

## 2023-07-28 DIAGNOSIS — K52832 Lymphocytic colitis: Secondary | ICD-10-CM | POA: Diagnosis not present

## 2023-07-28 DIAGNOSIS — J45909 Unspecified asthma, uncomplicated: Secondary | ICD-10-CM | POA: Diagnosis not present

## 2023-07-28 DIAGNOSIS — K642 Third degree hemorrhoids: Secondary | ICD-10-CM | POA: Insufficient documentation

## 2023-07-28 DIAGNOSIS — Z952 Presence of prosthetic heart valve: Secondary | ICD-10-CM | POA: Diagnosis not present

## 2023-07-28 DIAGNOSIS — K2980 Duodenitis without bleeding: Secondary | ICD-10-CM | POA: Diagnosis not present

## 2023-07-28 DIAGNOSIS — K3189 Other diseases of stomach and duodenum: Secondary | ICD-10-CM | POA: Diagnosis not present

## 2023-07-28 DIAGNOSIS — K222 Esophageal obstruction: Secondary | ICD-10-CM | POA: Insufficient documentation

## 2023-07-28 DIAGNOSIS — K649 Unspecified hemorrhoids: Secondary | ICD-10-CM | POA: Diagnosis not present

## 2023-07-28 HISTORY — PX: COLONOSCOPY: SHX5424

## 2023-07-28 HISTORY — PX: ESOPHAGOGASTRODUODENOSCOPY: SHX5428

## 2023-07-28 SURGERY — COLONOSCOPY
Anesthesia: General

## 2023-07-28 MED ORDER — PROPOFOL 500 MG/50ML IV EMUL
INTRAVENOUS | Status: DC | PRN
  Administered 2023-07-28: 70 mg via INTRAVENOUS
  Administered 2023-07-28: 130 ug/kg/min via INTRAVENOUS

## 2023-07-28 MED ORDER — SODIUM CHLORIDE 0.9 % IV SOLN: INTRAVENOUS | Status: DC

## 2023-07-28 MED ORDER — LIDOCAINE HCL (PF) 2 % IJ SOLN
INTRAMUSCULAR | Status: DC | PRN
  Administered 2023-07-28: 100 mg via INTRADERMAL

## 2023-07-28 NOTE — Op Note (Addendum)
 [facility] Patient Name: Daniel Kennedy Procedure Date: 07/28/2023 10:41 AM MRN: [id number] Account #: [id number] Date of Birth: [birth year] Admit Type: Outpatient Age: 83 Room: [facility] ENDO ROOM 2 Gender: Male Note Status: Finalized Instrument Name: Upper Endoscope [id number] Procedure:             Upper GI endoscopy Indications:           Diarrhea, Weight loss Providers:             Johnell Bas K. Muranda Coye MD, MD Medicines:             Propofol  per Anesthesia Complications:         No immediate complications. Estimated blood loss:                         Minimal. Procedure:             Pre-Anesthesia Assessment:                        - The risks and benefits of the procedure and the                         sedation options and risks were discussed with the                         patient. All questions were answered and informed                         consent was obtained.                        - Patient identification and proposed procedure were                         verified prior to the procedure by the nurse. The                         procedure was verified in the procedure room.                        After obtaining informed consent, the endoscope was                         passed under direct vision. Throughout the procedure,                         the patient's blood pressure, pulse, and oxygen                         saturations were monitored continuously. The Endoscope                         was introduced through the mouth, and advanced to the                         third part of duodenum. The upper GI endoscopy was                         accomplished without difficulty. The patient tolerated  the procedure well. Findings:      A non-obstructing Schatzki ring was found in the distal esophagus.      The exam of the esophagus was otherwise normal.      A 3 cm hiatal hernia was present.      The exam of the stomach  was otherwise normal.      Diffuse mildly erythematous mucosa without active bleeding and with no       stigmata of bleeding was found in the entire duodenum. Biopsies for       histology were taken with a cold forceps for evaluation of celiac       disease. Estimated blood loss was minimal.      The exam was otherwise without abnormality. Impression:            - Non-obstructing Schatzki ring.                        - 3 cm hiatal hernia.                        - Erythematous duodenopathy. Biopsied.                        - The examination was otherwise normal. Recommendation:        - Await pathology results.                        - Proceed with colonoscopy Procedure Code(s):     --- Professional ---                        [id number], Esophagogastroduodenoscopy, flexible,                         transoral; with biopsy, single or multiple Diagnosis Code(s):     --- Professional ---                        R63.4, Abnormal weight loss                        R19.7, Diarrhea, unspecified                        K31.89, Other diseases of stomach and duodenum                        K44.9, Diaphragmatic hernia without obstruction or                         gangrene                        K22.2, Esophageal obstruction CPT copyright 2022 American Medical Association. All rights reserved. The codes documented in this report are preliminary and upon coder review may  be revised to meet current compliance requirements. [name] MD, MD 07/28/2023 11:04:21 AM This report has been signed electronically. Number of Addenda: 0 Note Initiated On: 07/28/2023 10:41 AM Estimated Blood Loss:  Estimated blood loss was minimal.      [facility]

## 2023-07-28 NOTE — Anesthesia Preprocedure Evaluation (Signed)
 Anesthesia Evaluation  Patient identified by MRN, date of birth, ID band Patient awake    Reviewed: Allergy & Precautions, NPO status , Patient's Chart, lab work & pertinent test results  History of Anesthesia Complications Negative for: history of anesthetic complications  Airway Mallampati: IV   Neck ROM: Full    Dental  (+) Missing   Pulmonary asthma , Current Smoker (6-7 cigarettes per day) and Patient abstained from smoking.   Pulmonary exam normal breath sounds clear to auscultation       Cardiovascular + Valvular Problems/Murmurs (AS s/p AVR 2016)  Rhythm:Regular Rate:Normal + Systolic murmurs ECG 03/29/23:  Normal sinus rhythm  Left axis deviation  Anteroseptal infarct , age undetermined   Echo 04/14/23:  NORMAL LEFT VENTRICULAR SYSTOLIC FUNCTION WITH NO LVH  ESTIMATED EF: 50%, CALC EF(2D): 48%  NORMAL LA PRESSURES WITH NORMAL DIASTOLIC FUNCTION  NORMAL RIGHT VENTRICULAR SYSTOLIC FUNCTION  VALVULAR REGURGITATION: TRIVIAL AR, MILD MR, TRIVIAL PR, MILD TR  VALVULAR STENOSIS: BIOPROSTHETIC AoV, No MS, No PS, No TS  Low-normal LVEF  Bioprosthetic valve in the aortic position, normally functioning     Neuro/Psych  PSYCHIATRIC DISORDERS  Depression    negative neurological ROS     GI/Hepatic ,GERD  ,,  Endo/Other  negative endocrine ROS    Renal/GU negative Renal ROS   BPH    Musculoskeletal   Abdominal   Peds  Hematology negative hematology ROS (+)   Anesthesia Other Findings Cardiology note 03/29/23:  Cardiovascular Problem List  -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 Pt declines pharmacotx or NRT  -f/u 6 mos    Reproductive/Obstetrics                              Anesthesia Physical Anesthesia Plan  ASA: 3  Anesthesia Plan: General   Post-op Pain Management:    Induction: Intravenous  PONV Risk Score and Plan: 1 and Propofol  infusion, TIVA and Treatment may vary due to age or medical condition  Airway Management Planned: Natural Airway  Additional Equipment:   Intra-op Plan:   Post-operative Plan:   Informed Consent: I have reviewed the patients History and Physical, chart, labs and discussed the procedure including the risks, benefits and alternatives for the proposed anesthesia with the patient or authorized representative who has indicated his/her understanding and acceptance.       Plan Discussed with: CRNA  Anesthesia Plan Comments: (LMA/GETA backup discussed.  Patient consented for risks of anesthesia including but not limited to:  - adverse reactions to medications - damage to eyes, teeth, lips or other oral mucosa - nerve damage due to positioning  - sore throat or hoarseness - damage to heart, brain, nerves, lungs, other parts of body or loss of life  Informed patient about role of CRNA in peri- and intra-operative care.  Patient voiced understanding.)         Anesthesia Quick Evaluation

## 2023-07-28 NOTE — Interval H&P Note (Signed)
 History and Physical Interval Note:  07/28/2023 10:48 AM  Daniel JONETTA Leu Sr.  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  The various methods of treatment have been discussed with the patient and family. After consideration of risks, benefits and other options for treatment, the patient has consented to  Procedure(s): COLONOSCOPY (N/A) EGD (ESOPHAGOGASTRODUODENOSCOPY) (N/A) as a surgical intervention.  The patient's history has been reviewed, patient examined, no change in status, stable for surgery.  I have reviewed the patient's chart and labs.  Questions were answered to the patient's satisfaction.     [facility], [name]

## 2023-07-28 NOTE — Transfer of Care (Signed)
 Immediate Anesthesia Transfer of Care Note  Patient: Daniel DIEBEL Sr.  Procedure(s) Performed: COLONOSCOPY EGD (ESOPHAGOGASTRODUODENOSCOPY)  Patient Location: Endoscopy Unit  Anesthesia Type:General  Level of Consciousness: sedated  Airway & Oxygen Therapy: Patient Spontanous Breathing  Post-op Assessment: Report given to RN and Post -op Vital signs reviewed and stable  Post vital signs: Reviewed and stable  Last Vitals:  Vitals Value Taken Time  BP 102/59 07/28/23 11:21  Temp    Pulse 58 07/28/23 11:22  Resp 16 07/28/23 11:22  SpO2 99 % 07/28/23 11:22  Vitals shown include unfiled device data.  Last Pain:  Vitals:   07/28/23 1121  TempSrc:   PainSc: Asleep         Complications: There were no known notable events for this encounter.

## 2023-07-28 NOTE — H&P (Signed)
 Outpatient short stay form Pre-procedure 07/28/2023 10:47 AM Daniel Kennedy K. Daniel Kennedy, M.D.  Primary Physician: Daniel Kennedy, M.D.  Reason for visit:  Functional diarrhea, weight loss  History of present illness:  :  Mr. Daniel Kennedy presents today for clinical follow-up of chronic diarrhea. The patient says he continues to have between 3 and 12 stools per day although he does tend to have 3-4 on most days. He does have some fecal urgency with occasional incontinence as well. He is wanting further evaluation. He has reconsidered having a colonoscopy and other procedures and is agreeable. He denies any rectal bleeding but continues to have weight loss. He has lost 30+ pounds in the last year. Patient claims normal appetite although he continues to have some mild fatigue. 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    Labs: 06/23/2023: GI Profile, C Dif and Giardia O&P were all NEGATIVE.      Current Facility-Administered Medications:    0.9 %  sodium chloride  infusion, , Intravenous, Continuous, Daniel Kennedy, Daniel Kennedy K, MD, Last Rate: 20 mL/hr at 07/28/23 1045, Continued from Pre-op at 07/28/23 1045  Medications Prior to Admission  Medication Sig Dispense Refill Last Dose/Taking   aspirin  EC 81 MG EC tablet Take 1 tablet (81 mg total) by mouth daily.   07/27/2023   ferrous sulfate 325 (65 FE) MG tablet Take 1 tablet by mouth daily.   Past Week   Multiple Vitamin (MULTI-VITAMINS) TABS Take 1 tablet by mouth daily.   07/27/2023   sildenafil (REVATIO) 20 MG tablet    07/27/2023   tamsulosin  (FLOMAX ) 0.4 MG CAPS capsule Take 0.4 mg by mouth daily.   07/27/2023   vitamin C (ASCORBIC ACID) 500 MG tablet Take 500 mg by mouth daily.   07/27/2023   vitamin E (VITAMIN E) 400 UNIT capsule Take 400 Units by mouth daily.   07/27/2023   citalopram (CELEXA) 10 MG tablet Take by mouth.      dextromethorphan-guaiFENesin (MUCINEX DM) 30-600 MG 12hr tablet Take 1 tablet by mouth 2 (two) times  daily.      levocetirizine (XYZAL) 5 MG tablet Take 5 mg by mouth daily.      levocetirizine (XYZAL) 5 MG tablet Take by mouth.      mometasone  (ELOCON ) 0.1 % cream Apply 1 application topically daily as needed (Rash). Apply to affected area of rash in the morning Monday through Friday until clear. Avoid applying to face, groin, and axilla. Use as directed. Long-term use can cause thinning of the skin. 45 g 0    montelukast (SINGULAIR) 10 MG tablet Take 10 mg by mouth at bedtime.      naproxen sodium (ANAPROX) 220 MG tablet Take by mouth.      omeprazole (PRILOSEC) 20 MG capsule Take 20 mg by mouth daily.      omeprazole (PRILOSEC) 20 MG capsule Take by mouth.        Allergies  Allergen Reactions   Penicillin G Other (See Comments)    Has patient had a PCN reaction causing immediate rash, facial/tongue/throat swelling, SOB or lightheadedness with hypotension: No Has patient had a PCN reaction causing severe rash involving mucus membranes or skin necrosis: No Has patient had a PCN reaction that required hospitalization No Has patient had a PCN reaction occurring within the last 10 years: Yes If all of the above answers are NO, then may proceed with Cephalosporin     Past Medical History:  Diagnosis Date  Aortic stenosis    EF 50% echo 2010   Asthma    BPH (benign prostatic hyperplasia)    C. difficile colitis    Depression    GERD (gastroesophageal reflux disease)    Heart murmur    Shortness of breath dyspnea    WITH EXERTION     Review of systems:  Otherwise negative.    Physical Exam  Gen: Alert, oriented. Appears stated age.  HEENT: [place]/AT. PERRLA. Lungs: CTA, no wheezes. CV: RR nl S1, S2. Abd: soft, benign, no masses. BS+ Ext: No edema. Pulses 2+    Planned procedures: Proceed with EGD and colonoscopy. The patient understands the nature of the planned procedure, indications, risks, alternatives and potential complications including but not limited to bleeding,  infection, perforation, damage to internal organs and possible oversedation/side effects from anesthesia. The patient agrees and gives consent to proceed.  Please refer to procedure notes for findings, recommendations and patient disposition/instructions.     Daniel Kennedy K. Daniel Kennedy, M.D. Gastroenterology 07/28/2023  10:47 AM

## 2023-07-28 NOTE — Interval H&P Note (Signed)
 History and Physical Interval Note:  07/28/2023 10:42 AM  Daniel JONETTA Leu Sr.  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  The various methods of treatment have been discussed with the patient and family. After consideration of risks, benefits and other options for treatment, the patient has consented to  Procedure(s): COLONOSCOPY (N/A) EGD (ESOPHAGOGASTRODUODENOSCOPY) (N/A) as a surgical intervention.  The patient's history has been reviewed, patient examined, no change in status, stable for surgery.  I have reviewed the patient's chart and labs.  Questions were answered to the patient's satisfaction.     [place], Vineeth Fell

## 2023-07-28 NOTE — Op Note (Addendum)
 [facility] Patient Name: Daniel Kennedy Procedure Date: 07/28/2023 10:40 AM MRN: [id number] Account #: [id number] Date of Birth: [birth year] Admit Type: Outpatient Age: 83 Room: [facility] ENDO ROOM 2 Gender: Male Note Status: Finalized Instrument Name: Peds Colonoscope [id number] Procedure:             Colonoscopy Indications:           Functional diarrhea, Weight loss Providers:             Daniel Dennington K. Dann Ventress MD, MD Medicines:             Propofol  per Anesthesia Complications:         No immediate complications. Estimated blood loss:                         Minimal. Procedure:             Pre-Anesthesia Assessment:                        - The risks and benefits of the procedure and the                         sedation options and risks were discussed with the                         patient. All questions were answered and informed                         consent was obtained.                        - Patient identification and proposed procedure were                         verified prior to the procedure by the nurse. The                         procedure was verified in the procedure room.                        - ASA Grade Assessment: III - A patient with severe                         systemic disease.                        - After reviewing the risks and benefits, the patient                         was deemed in satisfactory condition to undergo the                         procedure.                        After obtaining informed consent, the colonoscope was                         passed under direct vision. Throughout the procedure,  the patient's blood pressure, pulse, and oxygen                         saturations were monitored continuously. The                         Colonoscope was introduced through the anus and                         advanced to the the cecum, identified by appendiceal                          orifice and ileocecal valve. The colonoscopy was                         performed without difficulty. The patient tolerated                         the procedure well. The quality of the bowel                         preparation was good. The ileocecal valve, appendiceal                         orifice, and rectum were photographed. The colonoscopy                         was performed without difficulty. Findings:      The perianal and digital rectal examinations were normal. Pertinent       negatives include normal sphincter tone and no palpable rectal lesions.      Non-bleeding internal hemorrhoids were found during retroflexion. The       hemorrhoids were Grade III (internal hemorrhoids that prolapse but       require manual reduction).      Many small-mouthed diverticula were found in the sigmoid colon.      Normal mucosa was found in the entire colon. Biopsies for histology were       taken with a cold forceps from the random colon for evaluation of       microscopic colitis. Verification of patient identification for the       specimen was done by the nurse using the patient's medical record       number. Estimated blood loss was minimal.      The exam was otherwise without abnormality. Impression:            - Non-bleeding internal hemorrhoids.                        - Diverticulosis in the sigmoid colon.                        - Normal mucosa in the entire examined colon. Biopsied.                        - The examination was otherwise normal. Recommendation:        - Patient has a contact number available for  emergencies. The signs and symptoms of potential                         delayed complications were discussed with the patient.                         Return to normal activities tomorrow. Written                         discharge instructions were provided to the patient.                        - Resume previous diet.                        -  Continue present medications.                        - Await pathology results.                        - You do NOT require further colon cancer screening                         measures (Annual stool testing (i.e. hemoccult, FIT,                         cologuard), sigmoidoscopy, colonoscopy or CT                         colonography). You should share this recommendation                         with your Primary Care provider.                        - Return to GI office in 3 months.                        - Telephone GI office to schedule appointment.                        - The findings and recommendations were discussed with                         the patient. Procedure Code(s):     --- Professional ---                        [id number], Colonoscopy, flexible; with biopsy, single or                         multiple Diagnosis Code(s):     --- Professional ---                        K57.30, Diverticulosis of large intestine without                         perforation or abscess without bleeding  R63.4, Abnormal weight loss                        K59.1, Functional diarrhea                        K64.2, Third degree hemorrhoids CPT copyright 2022 American [employer] Association. All rights reserved. The codes documented in this report are preliminary and upon coder review may  be revised to meet current compliance requirements. Daniel MARLA Boss MD, MD 07/28/2023 11:22:09 AM This report has been signed electronically. Number of Addenda: 0 Note Initiated On: 07/28/2023 10:40 AM Scope Withdrawal Time: 0 hours 6 minutes 3 seconds  Total Procedure Duration: 0 hours 11 minutes 27 seconds  Estimated Blood Loss:  Estimated blood loss was minimal.      [facility]

## 2023-07-28 NOTE — H&P (Signed)
 Outpatient short stay form Pre-procedure 07/28/2023 10:40 AM Daniel Kennedy, M.D.  Primary Physician: Tamra Leventhal, M.D.   Reason for visit:  Functional diarrhea, weight loss  History of present illness:  Mr. Daniel Kennedy presents today for clinical follow-up of chronic diarrhea. The patient says he continues to have between 3 and 12 stools per day although he does tend to have 3-4 on most days. He does have some fecal urgency with occasional incontinence as well. He is wanting further evaluation. He has reconsidered having a colonoscopy and other procedures and is agreeable. He denies any rectal bleeding but continues to have weight loss. He has lost 30+ pounds in the last year. Patient claims normal appetite although he continues to have some mild fatigue. 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   Labs: 06/23/2023: GI Profile, C Dif and Giardia O&P were all NEGATIVE.   Current Facility-Administered Medications:    0.9 %  sodium chloride  infusion, , Intravenous, Continuous, Wilmot, Kimm Sider K, MD, Last Rate: 20 mL/hr at 07/28/23 1020, Continued from Pre-op at 07/28/23 1020  Medications Prior to Admission  Medication Sig Dispense Refill Last Dose/Taking   aspirin  EC 81 MG EC tablet Take 1 tablet (81 mg total) by mouth daily.   07/27/2023   ferrous sulfate 325 (65 FE) MG tablet Take 1 tablet by mouth daily.   Past Week   Multiple Vitamin (MULTI-VITAMINS) TABS Take 1 tablet by mouth daily.   07/27/2023   sildenafil (REVATIO) 20 MG tablet    07/27/2023   tamsulosin  (FLOMAX ) 0.4 MG CAPS capsule Take 0.4 mg by mouth daily.   07/27/2023   vitamin C (ASCORBIC ACID) 500 MG tablet Take 500 mg by mouth daily.   07/27/2023   vitamin E (VITAMIN E) 400 UNIT capsule Take 400 Units by mouth daily.   07/27/2023   citalopram (CELEXA) 10 MG tablet Take by mouth.      dextromethorphan-guaiFENesin (MUCINEX DM) 30-600 MG 12hr tablet Take 1 tablet by mouth 2 (two) times daily.       levocetirizine (XYZAL) 5 MG tablet Take 5 mg by mouth daily.      levocetirizine (XYZAL) 5 MG tablet Take by mouth.      mometasone  (ELOCON ) 0.1 % cream Apply 1 application topically daily as needed (Rash). Apply to affected area of rash in the morning Monday through Friday until clear. Avoid applying to face, groin, and axilla. Use as directed. Long-term use can cause thinning of the skin. 45 g 0    montelukast (SINGULAIR) 10 MG tablet Take 10 mg by mouth at bedtime.      naproxen sodium (ANAPROX) 220 MG tablet Take by mouth.      omeprazole (PRILOSEC) 20 MG capsule Take 20 mg by mouth daily.      omeprazole (PRILOSEC) 20 MG capsule Take by mouth.        Allergies  Allergen Reactions   Penicillin G Other (See Comments)    Has patient had a PCN reaction causing immediate rash, facial/tongue/throat swelling, SOB or lightheadedness with hypotension: No Has patient had a PCN reaction causing severe rash involving mucus membranes or skin necrosis: No Has patient had a PCN reaction that required hospitalization No Has patient had a PCN reaction occurring within the last 10 years: Yes If all of the above answers are NO, then may proceed with Cephalosporin     Past Medical History:  Diagnosis Date   Aortic stenosis  EF 50% echo 2010   Asthma    BPH (benign prostatic hyperplasia)    C. difficile colitis    Depression    GERD (gastroesophageal reflux disease)    Heart murmur    Shortness of breath dyspnea    WITH EXERTION     Review of systems:  Otherwise negative.    Physical Exam  Gen: Alert, oriented. Appears stated age.  HEENT: [place]/AT. PERRLA. Lungs: CTA, no wheezes. CV: RR nl S1, S2. Abd: soft, benign, no masses. BS+ Ext: No edema. Pulses 2+    Planned procedures: Proceed with colonoscopy. The patient understands the nature of the planned procedure, indications, risks, alternatives and potential complications including but not limited to bleeding, infection,  perforation, damage to internal organs and possible oversedation/side effects from anesthesia. The patient agrees and gives consent to proceed.  Please refer to procedure notes for findings, recommendations and patient disposition/instructions.     [name] K. Kennedy, M.D. Gastroenterology 07/28/2023  10:40 AM

## 2023-07-29 LAB — SURGICAL PATHOLOGY

## 2023-07-29 NOTE — Anesthesia Postprocedure Evaluation (Signed)
 Anesthesia Post Note  Patient: Daniel BELLANCA Sr.  Procedure(s) Performed: COLONOSCOPY EGD (ESOPHAGOGASTRODUODENOSCOPY)  Patient location during evaluation: PACU Anesthesia Type: General Level of consciousness: awake and alert, oriented and patient cooperative Pain management: pain level controlled Vital Signs Assessment: post-procedure vital signs reviewed and stable Respiratory status: spontaneous breathing, nonlabored ventilation and respiratory function stable Cardiovascular status: blood pressure returned to baseline and stable Postop Assessment: adequate PO intake Anesthetic complications: no   There were no known notable events for this encounter.   Last Vitals:  Vitals:   07/28/23 0940 07/28/23 1121  BP: 136/86 (!) 102/59  Pulse: (!) 55 60  Resp: 18   Temp: (!) 35.6 C   SpO2: 99% 100%    Last Pain:  Vitals:   07/29/23 0739  TempSrc:   PainSc: 0-No pain                 Alfonso Ruths

## 2023-08-16 DIAGNOSIS — H25813 Combined forms of age-related cataract, bilateral: Secondary | ICD-10-CM | POA: Diagnosis not present

## 2023-08-20 DIAGNOSIS — R399 Unspecified symptoms and signs involving the genitourinary system: Secondary | ICD-10-CM | POA: Diagnosis not present

## 2023-08-23 DIAGNOSIS — H6122 Impacted cerumen, left ear: Secondary | ICD-10-CM | POA: Diagnosis not present

## 2023-08-23 DIAGNOSIS — H903 Sensorineural hearing loss, bilateral: Secondary | ICD-10-CM | POA: Diagnosis not present

## 2023-09-27 DIAGNOSIS — Z7982 Long term (current) use of aspirin: Secondary | ICD-10-CM | POA: Diagnosis not present

## 2023-09-27 DIAGNOSIS — I359 Nonrheumatic aortic valve disorder, unspecified: Secondary | ICD-10-CM | POA: Diagnosis not present

## 2023-09-27 DIAGNOSIS — Z952 Presence of prosthetic heart valve: Secondary | ICD-10-CM | POA: Diagnosis not present

## 2023-09-27 DIAGNOSIS — I700 Atherosclerosis of aorta: Secondary | ICD-10-CM | POA: Diagnosis not present

## 2023-09-27 DIAGNOSIS — I6523 Occlusion and stenosis of bilateral carotid arteries: Secondary | ICD-10-CM | POA: Diagnosis not present

## 2023-09-27 DIAGNOSIS — R9431 Abnormal electrocardiogram [ECG] [EKG]: Secondary | ICD-10-CM | POA: Diagnosis not present

## 2023-09-27 DIAGNOSIS — Z720 Tobacco use: Secondary | ICD-10-CM | POA: Diagnosis not present

## 2023-09-30 DIAGNOSIS — K52832 Lymphocytic colitis: Secondary | ICD-10-CM | POA: Diagnosis not present

## 2023-09-30 DIAGNOSIS — R634 Abnormal weight loss: Secondary | ICD-10-CM | POA: Diagnosis not present

## 2023-09-30 DIAGNOSIS — R5383 Other fatigue: Secondary | ICD-10-CM | POA: Diagnosis not present

## 2023-10-05 DIAGNOSIS — Z0000 Encounter for general adult medical examination without abnormal findings: Secondary | ICD-10-CM | POA: Diagnosis not present

## 2023-10-05 DIAGNOSIS — F17200 Nicotine dependence, unspecified, uncomplicated: Secondary | ICD-10-CM | POA: Diagnosis not present

## 2023-10-05 DIAGNOSIS — N390 Urinary tract infection, site not specified: Secondary | ICD-10-CM | POA: Diagnosis not present

## 2023-10-05 DIAGNOSIS — R195 Other fecal abnormalities: Secondary | ICD-10-CM | POA: Diagnosis not present

## 2023-10-05 DIAGNOSIS — Z954 Presence of other heart-valve replacement: Secondary | ICD-10-CM | POA: Diagnosis not present

## 2023-10-05 DIAGNOSIS — B952 Enterococcus as the cause of diseases classified elsewhere: Secondary | ICD-10-CM | POA: Diagnosis not present

## 2023-10-05 DIAGNOSIS — Z125 Encounter for screening for malignant neoplasm of prostate: Secondary | ICD-10-CM | POA: Diagnosis not present

## 2023-10-05 DIAGNOSIS — I700 Atherosclerosis of aorta: Secondary | ICD-10-CM | POA: Diagnosis not present

## 2023-10-12 DIAGNOSIS — F17200 Nicotine dependence, unspecified, uncomplicated: Secondary | ICD-10-CM | POA: Diagnosis not present

## 2023-10-12 DIAGNOSIS — I700 Atherosclerosis of aorta: Secondary | ICD-10-CM | POA: Diagnosis not present

## 2023-10-12 DIAGNOSIS — R5383 Other fatigue: Secondary | ICD-10-CM | POA: Diagnosis not present

## 2023-10-12 DIAGNOSIS — E875 Hyperkalemia: Secondary | ICD-10-CM | POA: Diagnosis not present

## 2023-10-12 DIAGNOSIS — N401 Enlarged prostate with lower urinary tract symptoms: Secondary | ICD-10-CM | POA: Diagnosis not present

## 2023-10-12 DIAGNOSIS — K52832 Lymphocytic colitis: Secondary | ICD-10-CM | POA: Diagnosis not present

## 2023-11-18 DIAGNOSIS — K52832 Lymphocytic colitis: Secondary | ICD-10-CM | POA: Diagnosis not present

## 2023-11-18 NOTE — Progress Notes (Signed)
 " Chief Complaint  Patient presents with   Follow-up   Diarrhea  Last OV 09/30/23   Subjective  Daniel Kennedy is a 83 y.o. male who presents for Follow-up and Diarrhea HPI History of Present Illness Summary of evaluation: - Labs: - CSY: 07/28/23 - Lymphocytic colitis - EGD: 07/28/23 - Normal duodenal biopsies   GI Medications: Current: Budesonide 9mg  daily (Started 09/30/23 at last oV). Prior:   Has hx of E. Faecalis (May 2025) - possibly treated with Cipro. C dif, GI profile, Giardia, O&P - Negative   Daniel KSIAZEK Sr. is an 83 year old male with chronic inflammatory colitis who presents with diarrhea.  He has been experiencing diarrhea, which initially improved with a short course of steroids. The diarrhea resolved for about two weeks after completing the medication but gradually returned, though not as severe as before treatment. He was initially prescribed three pills a day for one week, which he completed, but did not receive the intended full course of treatment, which was supposed to be nine milligrams a day for eight weeks. After the initial treatment, he did not experience significant issues until a recent episode this morning.  He recalls a previous episode of Clostridioides difficile infection at age 40, which was associated with similar symptoms. He lived with symptoms for about ten years before receiving a diagnosis of diabetes, which was confirmed with testing.  He has gained approximately four to five pounds since starting the medication. No current abdominal pain. Reports no issues with lungs or pupils.  Review of Systems  Patient Active Problem List  Diagnosis   ED (erectile dysfunction)   Seasonal allergies   BPH (benign prostatic hyperplasia)   Aortic valve disease   Moderate tobacco use disorder   Presence of other heart-valve replacement   Fatigue   Kidney lesion   Low HDL (under 40)   Pulmonary nodules   Other emphysema (CMS/HHS-HCC)   Aortic  atherosclerosis   Chronic diarrhea of unknown origin   Abnormal weight loss   History of Clostridium difficile infection   Diarrhea, functional   Lymphocytic colitis    Outpatient Medications Prior to Visit  Medication Sig Dispense Refill   ascorbic acid, vitamin C, (VITAMIN C) 500 MG tablet Take 500 mg by mouth once daily.     aspirin  81 MG EC tablet Take 1 tablet (81 mg total) by mouth every other day     ferrous sulfate 325 mg (65 mg iron) capsule Take 325 mg by mouth once daily.     lactobacillus combination no.4 (PROBIOTIC) 3 billion cell Cap Take by mouth     levocetirizine (XYZAL) 5 MG tablet Take 1 tablet (5 mg total) by mouth every evening 90 tablet 1   multivitamin tablet Take 1 tablet by mouth once daily.     sildenafil (REVATIO) 20 mg tablet TAKE TWO TABLETS BY MOUTH AS NEEDED UP TO ONE TIME DAILY. TAKE BEFORE SEXUAL ACTIVITY. 180 tablet 3   tamsulosin  (FLOMAX ) 0.4 mg capsule Take 1 capsule (0.4 mg total) by mouth once daily 30 MINUTES AFTER SAME MEAL EACH DAY 90 capsule 1   vitamin E 400 UNIT capsule Take 400 Units by mouth once daily.     finasteride (PROSCAR) 5 mg tablet Take 1 tablet (5 mg total) by mouth once daily for 180 days 90 tablet 1   budesonide (ENTOCORT EC) 3 mg EC capsule Take 3 capsules (9 mg total) by mouth every morning for 60 days (Patient not taking: Reported on 11/18/2023)  30 capsule 2   No facility-administered medications prior to visit.      Objective  Vitals:   11/18/23 1105  BP: 131/74  Pulse: 67  Temp: (!) 35.9 C (96.7 F)  TempSrc: Oral  Weight: 62.4 kg (137 lb 9.6 oz)  Height: 188 cm (6' 2)  PainSc: 0-No pain   Body mass index is 17.67 kg/m.  Home Vitals:     Physical Exam Physical Exam CHEST: Clear to auscultation bilaterally. ABDOMEN: Soft. NEUROLOGICAL: Pupils normal.  Constitutional: alert, in NAD, and communicates well.Appears thin. Eye exam: pupils equal and reactive, extraocular eye movements  intact. Neck: supple, no thyroid  enlargement or cervical adenopathy, and no bruits heard Respiratory: clear to auscultation, without rales or wheezes  Cardiovascular: regular rate and rhythm and without murmurs, rubs or gallops Lower extremities: no lower extremity edema Skin ankles/feet: warm, good capillary refill and no ulcerations or lesions noted Neurological: sensorimotor grossly intact and normal muscle tone  Results      Assessment/Plan:   Assessment & Plan Proceed with resuming Budesonide 9mg  daily for 90 days.     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 Previous treatment with budesonide for one week provided temporary relief. Symptoms returned after cessation of medication. No abdominal pain reported. Previous C. difficile infection noted. Current treatment plan adjusted to ensure adequate supply of medication. - Prescribed budesonide 9 mg daily for three months. - Scheduled follow-up appointment in three months to assess response to treatment and discuss potential tapering of medication.  Abnormal weight loss Weight gain of approximately four to five pounds since starting budesonide. Previous significant weight loss noted.  Recording duration: 9 minutes Diagnoses and all orders for this visit:  Lymphocytic colitis -     budesonide (ENTOCORT EC) 3 mg EC capsule; Take 3 capsules (9 mg total) by mouth every morning for 90 days    This visit was coded based on medical decision making (MDM).   Return to office in 3 months.        Future Appointments     Date/Time Provider Department Center Visit Type   06/02/2024 7:30 AM [facility] C LAB   06/09/2024 9:15 AM Hande, Tamra Cal, MD [facility] C PHYSICAL       There are no Patient Instructions on file for this visit.  An after visit summary was provided for the patient either in written format (printed) or through [facility].  This note has been created using automated tools and reviewed for accuracy by TEODORO KEITH TOLEDO.  "

## 2024-02-16 ENCOUNTER — Emergency Department

## 2024-02-16 ENCOUNTER — Emergency Department: Admission: EM | Admit: 2024-02-16 | Discharge: 2024-02-16 | Disposition: A | Discharge status: Home / Self Care

## 2024-02-16 DIAGNOSIS — S42215A Unspecified nondisplaced fracture of surgical neck of left humerus, initial encounter for closed fracture: Secondary | ICD-10-CM | POA: Insufficient documentation

## 2024-02-16 DIAGNOSIS — J45909 Unspecified asthma, uncomplicated: Secondary | ICD-10-CM | POA: Diagnosis not present

## 2024-02-16 DIAGNOSIS — W000XXA Fall on same level due to ice and snow, initial encounter: Secondary | ICD-10-CM | POA: Insufficient documentation

## 2024-02-16 DIAGNOSIS — Y9301 Activity, walking, marching and hiking: Secondary | ICD-10-CM | POA: Insufficient documentation

## 2024-02-16 DIAGNOSIS — S4992XA Unspecified injury of left shoulder and upper arm, initial encounter: Secondary | ICD-10-CM | POA: Diagnosis present

## 2024-02-16 MED ORDER — ACETAMINOPHEN 325 MG PO TABS
650.0000 mg | ORAL_TABLET | Freq: Once | ORAL | Status: AC
  Administered 2024-02-16: 650 mg via ORAL
  Filled 2024-02-16: qty 2

## 2024-02-16 NOTE — ED Provider Notes (Signed)
 "  [facility] Provider Note    Event Date/Time   First MD Initiated Contact with Patient 02/16/24 1314     (approximate)   History   Shoulder Pain   HPI  ERICK MURIN Sr. is a 84 y.o. male with PMH of BPH, aortic stenosis, asthma, GERD presents for evaluation of left shoulder pain.  Patient was walking outside and slipped on some ice landed on his left arm.  Patient endorses left shoulder pain.  Patient states he did not hit his head.  No LOC.      Physical Exam   Triage Vital Signs: ED Triage Vitals  Encounter Vitals Group     BP 02/16/24 1229 117/80     Girls Systolic BP Percentile --      Girls Diastolic BP Percentile --      Boys Systolic BP Percentile --      Boys Diastolic BP Percentile --      Pulse Rate 02/16/24 1229 79     Resp 02/16/24 1229 18     Temp 02/16/24 1229 98.4 F (36.9 C)     Temp Source 02/16/24 1229 Oral     SpO2 02/16/24 1229 98 %     Weight 02/16/24 1223 135 lb (61.2 kg)     Height 02/16/24 1223 6' 2 (1.88 m)     Head Circumference --      Peak Flow --      Pain Score 02/16/24 1223 7     Pain Loc --      Pain Education --      Exclude from Growth Chart --     Most recent vital signs: Vitals:   02/16/24 1229 02/16/24 1622  BP: 117/80 (!) 147/88  Pulse: 79 87  Resp: 18 16  Temp: 98.4 F (36.9 C)   SpO2: 98% 94%    General: Awake, no distress.  CV:  Good peripheral perfusion.  Resp:  Normal effort.  Abd:  No distention.  Other:  Tender to palpation over the left shoulder joint, patient is able to do some range of motion of the elbow although this makes the pain in the shoulder worse, radial pulse 2+ and regular, sensation intact in the hands, patient able to give thumbs up, make okay sign, cross middle finger over index finger and perform thumb opposition with each finger, strength is 5/5 in the hand, no obvious deformity of the shoulder   ED Results / Procedures / Treatments   Labs (all labs ordered  are listed, but only abnormal results are displayed) Labs Reviewed - No data to display  RADIOLOGY  Left shoulder and left humerus x-ray obtained, I interpreted the images as well as reviewed the radiologist report.  My interpretation is fracture at the neck of the humerus.  Humerus x-ray IMPRESSION:  1. Nondisplaced fracture of the surgical neck of the proximal left humerus with  mild impaction.   Shoulder x-ray IMPRESSION:  1. Transverse minimally displaced fracture of the humeral neck with possible  extension into the greater tuberosity.    PROCEDURES:  Critical Care performed: No  Procedures   MEDICATIONS ORDERED IN ED: Medications  acetaminophen  (TYLENOL ) tablet 650 mg (650 mg Oral Given 02/16/24 1617)     IMPRESSION / MDM / ASSESSMENT AND PLAN / ED COURSE  I reviewed the triage vital signs and the nursing notes.  84 year old male presents for evaluation of left shoulder pain after mechanical fall.  Vital signs are stable patient NAD on exam.  Differential diagnosis includes, but is not limited to, fracture, dislocation, contusion, abrasion, muscle strain.  Patient's presentation is most consistent with acute complicated illness / injury requiring diagnostic workup.  Patient was quite tender over the left shoulder but pain was manageable as long as he was not moving.  He was neurovascularly intact.  X-ray of the left humerus and left shoulder obtained.  Images showed a fracture at the surgical neck of the humerus.  Spoke with orthopedics who recommended shoulder sling only, no splint needed.  Patient will need to follow-up with them as an outpatient.  Discussed pain control options with the patient and offered to send Norco but patient preferred to take only Tylenol  and ibuprofen.    We reviewed return precautions.  Patient and patient's wife voiced understanding, all questions were answered and he is stable at discharge.      FINAL  CLINICAL IMPRESSION(S) / ED DIAGNOSES   Final diagnoses:  Closed nondisplaced fracture of surgical neck of left humerus, unspecified fracture morphology, initial encounter     Rx / DC Orders   ED Discharge Orders     None        Note:  This document was prepared using Dragon voice recognition software and may include unintentional dictation errors.   Cleaster Tinnie LABOR, PA-C 02/16/24 1812  "

## 2024-02-16 NOTE — Discharge Instructions (Signed)
 The xray shows that you have broken your arm. Please wear the shoulder sling at all times. You will need to follow up with orthopedics who's information is attached.   You can take 650 mg of Tylenol  and 600 mg of ibuprofen every 6 hours as needed for pain. You can use ice, heat, muscle creams and other topical pain relievers as well.

## 2024-02-16 NOTE — ED Notes (Signed)
 Pt changed out of pants and in bed at this time. Pt requesting urinal. Pt declining assistance with urinal. Bed alarm and non slip socks placed on pt.

## 2024-02-16 NOTE — ED Triage Notes (Signed)
 First Nurse Note:  Pt via [facility] from home.Pt was walking outside and slipped, Pt c/o L shoulder pain. EMS placed a temporary splint. Denies LOC. Denies blood thinner. EMS gave 650 mg of Tylenol . Pt is A&Ox4 and NAD  EMS reports 143/72 BP, 95% on RA, 84 HR

## 2024-02-17 ENCOUNTER — Telehealth: Payer: Self-pay | Admitting: Orthopedic Surgery

## 2024-02-17 NOTE — Telephone Encounter (Signed)
"  Pt added   "

## 2024-02-17 NOTE — Telephone Encounter (Signed)
 Dr Onesimo please advise of f/u

## 2024-02-17 NOTE — Telephone Encounter (Signed)
 Pt's wife Gaetana called. Pt was seen at [facility] fro fractured left shoulder. Wife states pt is in a lot of pain and the brace they put him in is making it worse. Discharge states to see [facility]. Please call Glenda at [phone].

## 2024-02-18 ENCOUNTER — Ambulatory Visit: Admitting: Physician Assistant

## 2024-02-18 VITALS — BP 131/68 | HR 81 | Ht 74.0 in | Wt 143.6 lb

## 2024-02-18 DIAGNOSIS — S42202A Unspecified fracture of upper end of left humerus, initial encounter for closed fracture: Secondary | ICD-10-CM | POA: Diagnosis not present

## 2024-02-18 NOTE — Patient Instructions (Signed)

## 2024-02-18 NOTE — Progress Notes (Signed)
 Orthopaedic Surgery New Shoulder Visit   History of Present Illness:  84 year old male presents to [facility] [place] for follow-up regarding left humeral fracture.  Patient seen at [facility] on 02/16/2024 on same day as fall/slip on ice.  Landed on left arm.  Has severe left shoulder pain.  Exacerbated with any movement.  Describes as deep ache in shoulder.  Left shoulder and left humerus x-ray revealed transverse nondisplaced fracture of the surgical neck of the proximal humerus with mild impaction and possible extension into the greater tuberosity.  Orthopedist on-call, Dr. Onesimo, was consulted and advised patient be placed in a shoulder sling only, no splint needed.  Patient advised to follow-up outpatient.  Patient was offered Norco but declined prescription pain medication and preferred to only use over-the-counter Tylenol  and ibuprofen.  Patient presents today due to uncontrolled left shoulder pain (called office yesterday 02/17/2024).  States has no/minimal pain at rest but with moving from a sitting to standing position or trying to get comfortable in the recliner will have severe left shoulder pain.  Has been taking over-the-counter extra strength Tylenol  500 mg, 2 pills every 6 hours, and alternating every 6 hours with over-the-counter Aleve, one 220 mg tablet.  Patient states pain is not too bad this morning.  Denies radiating pain, left upper extremity weakness or paresthesias.  Denies open wound.  Denies worsening edema or ecchymosis.  Patient accompanied to appointment by wife, Gaetana.   Past Medical, Social and Family History: Past Medical History:  Diagnosis Date   Aortic stenosis    EF 50% echo 2010   Asthma    BPH (benign prostatic hyperplasia)    C. 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;  Surgeon: Dorise MARLA Fellers, MD;  Location: [facility] OR;  Service: Open Heart Surgery;  Laterality: N/A;   CARDIAC CATHETERIZATION N/A 10/19/2014   Procedure: Right/Left Heart Cath and Coronary Angiography;  Surgeon: Ozell Fell, MD;  Location: [facility] INVASIVE CV LAB;  Service: Cardiovascular;  Laterality: N/A;   COLONOSCOPY N/A 07/28/2023   Procedure: COLONOSCOPY;  Surgeon: Toledo, [name] MARLA, MD;  Location: [facility] ENDOSCOPY;  Service: Gastroenterology;  Laterality: N/A;   ESOPHAGOGASTRODUODENOSCOPY N/A 07/28/2023   Procedure: EGD (ESOPHAGOGASTRODUODENOSCOPY);  Surgeon: Toledo, [name] MARLA, MD;  Location: [facility] ENDOSCOPY;  Service: Gastroenterology;  Laterality: N/A;   TEE WITHOUT CARDIOVERSION N/A 11/08/2014   Procedure: TRANSESOPHAGEAL ECHOCARDIOGRAM (TEE);  Surgeon: Dorise MARLA Fellers, MD;  Location: [facility] OR;  Service: Open Heart Surgery;  Laterality: N/A;   Allergies[1] Medications Ordered Prior to Encounter[2] Social History   Socioeconomic History   Marital status: Married    Spouse name: Not on file   Number of children: Not on file   Years of education: Not on file   Highest education level: Not on file  Occupational History   Not on file  Tobacco Use   Smoking status: Every Day    Current packs/day: 0.33    Average packs/day: 0.3 packs/day for 58.0 years (19.1 ttl pk-yrs)    Types: Cigarettes   Smokeless tobacco: Never  Vaping Use   Vaping status: Not on file  Substance and Sexual Activity   Alcohol  use: No  Alcohol /week: 0.0 standard drinks of alcohol    Drug use: No   Sexual activity: Not on file  Other Topics Concern   Not on file  Social History Narrative   Not on file   Social Drivers of Health   Tobacco Use: High Risk (09/27/2023)   Received from [facility]   Patient History    Smoking Tobacco Use: Every Day    Smokeless Tobacco Use: Never    Passive  Exposure: Not on file  Financial Resource Strain: Low Risk  (06/09/2023)   Received from [facility]   Overall Financial Resource Strain (CARDIA)    Difficulty of Paying Living Expenses: Not hard at all  Food Insecurity: No Food Insecurity (06/09/2023)   Received from [facility]   Epic    Within the past 12 months, you worried that your food would run out before you got the money to buy more.: Never true    Within the past 12 months, the food you bought just didn't last and you didn't have money to get more.: Never true  Transportation Needs: No Transportation Needs (06/09/2023)   Received from [facility] - Transportation    In the past 12 months, has lack of transportation kept you from medical appointments or from getting medications?: No    Lack of Transportation (Non-Medical): No  Physical Activity: Not on file  Stress: Not on file  Social Connections: Not on file  Intimate Partner Violence: Not on file  Depression (EYV7-0): Not on file  Alcohol  Screen: Not on file  Housing: Low Risk  (06/09/2023)   Received from [facility]   Epic    In the last 12 months, was there a time when you were not able to pay the mortgage or rent on time?: No    In the past 12 months, how many times have you moved where you were living?: 0    At any time in the past 12 months, were you homeless or living in a shelter (including now)?: No  Utilities: Not At Risk (06/09/2023)   Received from [facility] Utilities    Threatened with loss of utilities: No  Health Literacy: Not on file    I have reviewed past medical, surgical, social and family history, medications and allergies as documented in the EMR.  Review of Systems - A 7 point review of systems was performed and was negative with the exception of the above HPI.    Physical Exam: General/Constitutional: NAD Vascular: No edema, swelling or  tenderness, except as noted in detailed exam Integumentary: No impressive skin lesions present, except as noted in detailed exam Neuro/Psych: Normal mood and affect, oriented to person, place and time Musculoskeletal: Normal, except as noted in detailed exam and in HPI  Neck focused exam: Palpation: non-tender to palpation about the mid-line spine and paraspinal musculature ROM: within normal limits in flexion, extension, rotation and side-bending   Shoulder focused exam:  Patient's left shoulder in a sling.  No obvious deformity of shoulder joint.  No visible edema, erythema, or ecchymosis.  Mild tenderness with palpation over proximal humerus.  No palpable step-off or deformity or crepitus.  No range of motion performed at the shoulder.  Full range of motion with flexion extension at the elbow, full range of motion flexion extension and radial and ulnar deviation at the wrist and patient able to make a composite fist and  5/5 grip strength.  Fingers warm and well perfused with 2+ radial pulse.  Sensation intact to the Median, Ulnar and Radial nerve distribution of the hand.    AIN/PIN/Ulnar nerve motor function intact.       XR Shoulder Imaging:  Radiology Read: EXAM: 1 VIEW(S) XRAY OF THE LEFT SHOULDER 02/16/2024 01:43:00 PM   COMPARISON: None available.   CLINICAL HISTORY: Shoulder pain.   FINDINGS:   BONES AND JOINTS: Transverse minimally displaced fracture of the humeral neck. Possible fracture line extending into the greater tuberosity. Glenohumeral joint is normally aligned. No malalignment. The [facility] joint is unremarkable.   SOFT TISSUES: No abnormal calcifications. Visualized lung is unremarkable.   IMPRESSION: 1. Transverse minimally displaced fracture of the humeral neck with possible extension into the greater tuberosity.   Electronically signed by: Waddell Calk MD 02/16/2024 02:15 PM EST RP Workstation: [id number]  EXAM: VIEW(S) XRAY OF THE LEFT  HUMERUS 02/16/2024 01:43:00 PM   COMPARISON: None available.   CLINICAL HISTORY: Shoulder pain.   FINDINGS:   BONES AND JOINTS: Nondisplaced fracture of surgical neck of the proximal left humerus. Mild impaction.   SOFT TISSUES: Unremarkable.   IMPRESSION: 1. Nondisplaced fracture of the surgical neck of the proximal left humerus with mild impaction.   Electronically signed by: Waddell Calk MD 02/16/2024 02:14 PM EST RP Workstation: [id number]    Assessment:  Closed nondisplaced fracture of the surgical neck of left humerus  Plan: Patient was seen and examined in office today. We reviewed patient's history, examination, and imaging.   Patient sling was refit and patient reports significant increased comfortability.  Sling position with humerus in extension position causing increase in pain.  Upon proper fitting of sling patient felt significantly more comfortable.  Reported 0 pain when sitting on examination table.  Stated felt more secure when moving.  Recommended continuation of rest, ice, heat, immobilization, for continued conservative care measures.  Discussed utilizing hand and performing gentle range of motion of the wrist to prevent stiffness.  Did discuss pendulum exercises for shoulder but can also be utilized when doing home hygiene.  Advised wearing the sling for all times except showering/bathing.  Patient will continue to use over-the-counter medication, again declined prescription pain medication.  Advise no more than 3 g / 3000 mg of Tylenol  in a 24-hour.,  So decreasing to 2 extra strength 500 mg Tylenol  tablets every 8 hours instead of 6.  Also advised could use over-the-counter Aleve 2 tablets twice a day or could continue to do 1 tablet every 6 hours; did advised to take with food to prevent stomach upset.   Patient education material was provided.   All questions, concerns and comments were addressed to the best of my ability.  Follow-up scheduled  with Dr. Onesimo for next week, Feb. 6th, 2026   Lucie Stabs, PA-C Orthopedic Surgery  [place]     [1]  Allergies Allergen Reactions   Penicillin G Other (See Comments)    Has patient had a PCN reaction causing immediate rash, facial/tongue/throat swelling, SOB or lightheadedness with hypotension: No Has patient had a PCN reaction causing severe rash involving mucus membranes or skin necrosis: No Has patient had a PCN reaction that required hospitalization No Has patient had a PCN reaction occurring within the last 10 years: Yes If all of the above answers are NO, then may proceed with Cephalosporin  [2]  Current Outpatient Medications on File Prior to Visit  Medication Sig Dispense Refill   aspirin  EC 81  MG EC tablet Take 1 tablet (81 mg total) by mouth daily.     citalopram (CELEXA) 10 MG tablet Take by mouth.     dextromethorphan-guaiFENesin (MUCINEX DM) 30-600 MG 12hr tablet Take 1 tablet by mouth 2 (two) times daily.     ferrous sulfate 325 (65 FE) MG tablet Take 1 tablet by mouth daily.     levocetirizine (XYZAL) 5 MG tablet Take 5 mg by mouth daily.     levocetirizine (XYZAL) 5 MG tablet Take by mouth.     mometasone  (ELOCON ) 0.1 % cream Apply 1 application topically daily as needed (Rash). Apply to affected area of rash in the morning Monday through Friday until clear. Avoid applying to face, groin, and axilla. Use as directed. Long-term use can cause thinning of the skin. 45 g 0   montelukast (SINGULAIR) 10 MG tablet Take 10 mg by mouth at bedtime.     Multiple Vitamin (MULTI-VITAMINS) TABS Take 1 tablet by mouth daily.     naproxen sodium (ANAPROX) 220 MG tablet Take by mouth.     omeprazole (PRILOSEC) 20 MG capsule Take 20 mg by mouth daily.     omeprazole (PRILOSEC) 20 MG capsule Take by mouth.     sildenafil (REVATIO) 20 MG tablet      tamsulosin  (FLOMAX ) 0.4 MG CAPS capsule Take 0.4 mg by mouth daily.     vitamin C (ASCORBIC ACID) 500 MG tablet Take 500 mg by  mouth daily.     vitamin E (VITAMIN E) 400 UNIT capsule Take 400 Units by mouth daily.     No current facility-administered medications on file prior to visit.

## 2024-02-25 ENCOUNTER — Ambulatory Visit: Admitting: Orthopedic Surgery

## 2024-02-25 ENCOUNTER — Ambulatory Visit

## 2024-02-25 ENCOUNTER — Encounter: Payer: Self-pay | Admitting: Orthopedic Surgery

## 2024-02-25 DIAGNOSIS — S42202A Unspecified fracture of upper end of left humerus, initial encounter for closed fracture: Secondary | ICD-10-CM

## 2024-02-25 NOTE — Progress Notes (Signed)
 Return Patient Visit  Summary: Daniel Kennedy is a 84 y.o. male with the following: Left proximal humerus fracture    Assessment & Plan Fracture of upper end of left humerus 1.5 weeks post-traumatic proximal humerus fracture with mild displacement and maintained alignment. Radiographs showed no significant shift or loss of reduction. Pain and function improving with conservative management. No neurovascular compromise. Nonoperative management expected to result in healing and recovery of most shoulder motion. Surgical intervention reserved for poor outcomes or dissatisfaction post-healing. Skin irritation from sling noted without wound complications. - Reviewed radiographs confirming maintained alignment. - Reinforced sling immobilization for 4-6 weeks, permitting short breaks for hygiene. - Advised continuation of acetaminophen  and naproxen as needed for analgesia. - Discussed gradual progression to gentle range of motion exercises after approximately one month, with advancement to more active motion after six weeks. - Recommended monitoring for new or worsening symptoms and to return sooner if complications arise. - Scheduled follow-up in three weeks to reassess healing and discuss initiation of gentle motion. - Assessed skin irritation from sling; recommended protective dressings or long sleeve shirts to minimize further irritation.     Follow-up: Return in about 5 weeks (around 03/31/2024).  Subjective: Left shoulder pain    Discussed the use of AI scribe software for clinical note transcription with the patient, who gave verbal consent to proceed.  History of Present Illness Daniel Kennedy is a 84 year old male with a proximal left humerus fracture who presents for follow-up of left shoulder pain and injury.  He is 1.5 weeks from a proximal left humerus fracture treated with sling immobilization. Pain has gradually improved and is manageable with acetaminophen  and NSAIDs but persists  with certain movements or positioning. He notes intermittent transient numbness with improper arm positioning, steady shoulder discomfort, and occasional back muscle spasm.  Left upper extremity mobility is markedly limited and interferes with activities of daily living, including shaving and bathing. He needs assistance with showering and has difficulty with personal hygiene due to pain and restricted range of motion. Sling use has caused local skin irritation and rawness.  He is right hand dominant and the injury involves the left non-dominant arm. He continues to use the sling and can perform basic tasks with the right arm. He has had no new or worsening symptoms since the injury.    Review of Systems: No fevers or chills No numbness or tingling No chest pain No shortness of breath No bowel or bladder dysfunction No GI distress No headaches   Medical History:  Past Medical History:  Diagnosis Date   Aortic stenosis    EF 50% echo 2010   Asthma    BPH (benign prostatic hyperplasia)    C. 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;  Surgeon: Dorise MARLA Fellers, MD;  Location: [facility] OR;  Service: Open Heart Surgery;  Laterality: N/A;   CARDIAC CATHETERIZATION N/A 10/19/2014   Procedure: Right/Left Heart Cath and Coronary Angiography;  Surgeon: Ozell Fell, MD;  [facility] INVASIVE CV LAB;  Service: Cardiovascular;  Laterality: N/A;   COLONOSCOPY N/A 07/28/2023   Procedure: COLONOSCOPY;  Surgeon: Toledo, [name] MARLA, MD;  Location: [facility] ENDOSCOPY;  Service: Gastroenterology;  Laterality: N/A;   ESOPHAGOGASTRODUODENOSCOPY N/A 07/28/2023   Procedure: EGD (ESOPHAGOGASTRODUODENOSCOPY);  Surgeon:  Toledo, [name] POUR, MD;  Location: [facility] ENDOSCOPY;  Service: Gastroenterology;  Laterality: N/A;   TEE WITHOUT CARDIOVERSION N/A 11/08/2014   Procedure: TRANSESOPHAGEAL ECHOCARDIOGRAM (TEE);  Surgeon: Dorise POUR Fellers, MD;  Location: [facility] OR;  Service: Open Heart Surgery;  Laterality: N/A;   Social History[1]   Allergies[2]   Medications Ordered Prior to Encounter[3]    There were no vitals taken for this visit.  Physical Exam:    General: Elderly male., Alert and oriented., and No acute distress. Gait: Slow, steady gait.  Physical Exam MUSCULOSKELETAL: Left arm with bruising and swelling.  Sensation intact in the hand.  Sensation intact in the axillary nerve distribution.  No skin breakdown over elbow, but skin is irritated.    IMAGING: I personally ordered and reviewed the following images  Left shoulder with mildly displaced left proximal humerus fracture.  Impaction of the shaft, fracture at surgical neck.  Mild posterior rotation.    New Medications:  No orders of the defined types were placed in this encounter.     Portions of this note were completed via Scientist, clinical (histocompatibility and immunogenetics).  [name], MD  02/25/2024 11:24 PM      [1]  Social History Tobacco Use   Smoking status: Every Day    Current packs/day: 0.33    Average packs/day: 0.3 packs/day for 58.0 years (19.1 ttl pk-yrs)    Types: Cigarettes   Smokeless tobacco: Never  Substance Use Topics   Alcohol  use: No    Alcohol /week: 0.0 standard drinks of alcohol    Drug use: No  [2]  Allergies Allergen Reactions   Penicillin G Other (See Comments)    Has patient had a PCN reaction causing immediate rash, facial/tongue/throat swelling, SOB or lightheadedness with hypotension: No Has patient had a PCN reaction causing severe rash involving mucus membranes or skin necrosis: No Has patient had a PCN reaction that required hospitalization No Has patient had a PCN reaction occurring within the last 10 years:  Yes If all of the above answers are NO, then may proceed with Cephalosporin  [3]  Current Outpatient Medications on File Prior to Visit  Medication Sig Dispense Refill   aspirin  EC 81 MG EC tablet Take 1 tablet (81 mg total) by mouth daily.     citalopram (CELEXA) 10 MG tablet Take by mouth.     dextromethorphan-guaiFENesin (MUCINEX DM) 30-600 MG 12hr tablet Take 1 tablet by mouth 2 (two) times daily.     ferrous sulfate 325 (65 FE) MG tablet Take 1 tablet by mouth daily.     levocetirizine (XYZAL) 5 MG tablet Take 5 mg by mouth daily.     levocetirizine (XYZAL) 5 MG tablet Take by mouth.     mometasone  (ELOCON ) 0.1 % cream Apply 1 application topically daily as needed (Rash). Apply to affected area of rash in the morning Monday through Friday until clear. Avoid applying to face, groin, and axilla. Use as directed. Long-term use can cause thinning of the skin. 45 g 0   montelukast (SINGULAIR) 10 MG tablet Take 10 mg by mouth at bedtime.     Multiple Vitamin (MULTI-VITAMINS) TABS Take 1 tablet by mouth daily.     naproxen sodium (ANAPROX) 220 MG tablet Take by mouth.     omeprazole (PRILOSEC) 20 MG capsule Take 20 mg by mouth daily.     omeprazole (PRILOSEC) 20 MG capsule Take by  mouth.     sildenafil (REVATIO) 20 MG tablet      tamsulosin  (FLOMAX ) 0.4 MG CAPS capsule Take 0.4 mg by mouth daily.     vitamin C (ASCORBIC ACID) 500 MG tablet Take 500 mg by mouth daily.     vitamin E (VITAMIN E) 400 UNIT capsule Take 400 Units by mouth daily.     No current facility-administered medications on file prior to visit.

## 2024-03-16 ENCOUNTER — Ambulatory Visit: Admitting: Orthopedic Surgery
